# Patient Record
Sex: Male | Born: 1955 | Race: White | Hispanic: No | Marital: Married | Smoking: Never smoker
Health system: Southern US, Community
[De-identification: ages and names within clinical notes are randomized; demographics above are authoritative.]

## PROBLEM LIST (undated history)

## (undated) DIAGNOSIS — F419 Anxiety disorder, unspecified: Secondary | ICD-10-CM

## (undated) DIAGNOSIS — K602 Anal fissure, unspecified: Secondary | ICD-10-CM

## (undated) DIAGNOSIS — R7309 Other abnormal glucose: Secondary | ICD-10-CM

## (undated) DIAGNOSIS — K5190 Ulcerative colitis, unspecified, without complications: Secondary | ICD-10-CM

## (undated) DIAGNOSIS — I10 Essential (primary) hypertension: Secondary | ICD-10-CM

## (undated) DIAGNOSIS — K91850 Pouchitis: Secondary | ICD-10-CM

## (undated) DIAGNOSIS — T7840XA Allergy, unspecified, initial encounter: Secondary | ICD-10-CM

## (undated) DIAGNOSIS — H269 Unspecified cataract: Secondary | ICD-10-CM

## (undated) DIAGNOSIS — M1990 Unspecified osteoarthritis, unspecified site: Secondary | ICD-10-CM

## (undated) DIAGNOSIS — E785 Hyperlipidemia, unspecified: Secondary | ICD-10-CM

## (undated) DIAGNOSIS — F32A Depression, unspecified: Secondary | ICD-10-CM

## (undated) HISTORY — PX: COLON SURGERY: SHX602

## (undated) HISTORY — DX: Other abnormal glucose: R73.09

## (undated) HISTORY — DX: Unspecified cataract: H26.9

## (undated) HISTORY — DX: Allergy, unspecified, initial encounter: T78.40XA

## (undated) HISTORY — DX: Hyperlipidemia, unspecified: E78.5

## (undated) HISTORY — DX: Ulcerative colitis, unspecified, without complications: K51.90

## (undated) HISTORY — DX: Essential (primary) hypertension: I10

## (undated) HISTORY — DX: Unspecified osteoarthritis, unspecified site: M19.90

## (undated) HISTORY — PX: SMALL INTESTINE SURGERY: SHX150

## (undated) HISTORY — PX: POUCHOSCOPY: SHX2262

## (undated) HISTORY — DX: Depression, unspecified: F32.A

## (undated) HISTORY — DX: Anal fissure, unspecified: K60.2

## (undated) HISTORY — DX: Pouchitis: K91.850

---
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
## 1998-06-19 ENCOUNTER — Ambulatory Visit (HOSPITAL_COMMUNITY): Admission: RE | Admit: 1998-06-19 | Discharge: 1998-06-19 | Payer: Self-pay | Admitting: Gastroenterology

## 2011-05-25 ENCOUNTER — Other Ambulatory Visit (HOSPITAL_COMMUNITY): Payer: Self-pay | Admitting: Internal Medicine

## 2011-05-25 ENCOUNTER — Ambulatory Visit (HOSPITAL_COMMUNITY)
Admission: RE | Admit: 2011-05-25 | Discharge: 2011-05-25 | Disposition: A | Discharge status: Home / Self Care | Payer: BC Managed Care – PPO | Source: Ambulatory Visit | Attending: Internal Medicine | Admitting: Internal Medicine

## 2011-05-25 DIAGNOSIS — M79609 Pain in unspecified limb: Secondary | ICD-10-CM | POA: Insufficient documentation

## 2011-05-25 DIAGNOSIS — M545 Low back pain, unspecified: Secondary | ICD-10-CM | POA: Insufficient documentation

## 2012-02-02 HISTORY — PX: ELBOW SURGERY: SHX618

## 2012-03-13 ENCOUNTER — Other Ambulatory Visit: Payer: Self-pay | Admitting: Occupational Medicine

## 2012-03-13 ENCOUNTER — Ambulatory Visit: Payer: Self-pay

## 2012-03-13 DIAGNOSIS — M25521 Pain in right elbow: Secondary | ICD-10-CM

## 2012-08-01 HISTORY — PX: ELBOW LIGAMENT RECONSTRUCTION: SHX6359

## 2012-12-17 ENCOUNTER — Encounter: Payer: Self-pay | Admitting: Internal Medicine

## 2012-12-17 DIAGNOSIS — M1990 Unspecified osteoarthritis, unspecified site: Secondary | ICD-10-CM | POA: Insufficient documentation

## 2012-12-17 DIAGNOSIS — Z87898 Personal history of other specified conditions: Secondary | ICD-10-CM | POA: Insufficient documentation

## 2012-12-17 NOTE — Patient Instructions (Addendum)
Hypertension As your heart beats, it forces blood through your arteries. This force is your blood pressure. If the pressure is too high, it is called hypertension (HTN) or high blood pressure. HTN is dangerous because you may have it and not know it. High blood pressure may mean that your heart has to work harder to pump blood. Your arteries may be narrow or stiff. The extra work puts you at risk for heart disease, stroke, and other problems.  Blood pressure consists of two numbers, a higher number over a lower, 110/72, for example. It is stated as "110 over 72." The ideal is below 120 for the top number (systolic) and under 80 for the bottom (diastolic). Write down your blood pressure today. You should pay close attention to your blood pressure if you have certain conditions such as:  Heart failure.  Prior heart attack.  Diabetes  Chronic kidney disease.  Prior stroke.  Multiple risk factors for heart disease. To see if you have HTN, your blood pressure should be measured while you are seated with your arm held at the level of the heart. It should be measured at least twice. A one-time elevated blood pressure reading (especially in the Emergency Department) does not mean that you need treatment. There may be conditions in which the blood pressure is different between your right and left arms. It is important to see your caregiver soon for a recheck. Most people have essential hypertension which means that there is not a specific cause. This type of high blood pressure may be lowered by changing lifestyle factors such as:  Stress.  Smoking.  Lack of exercise.  Excessive weight.  Drug/tobacco/alcohol use.  Eating less salt. Most people do not have symptoms from high blood pressure until it has caused damage to the body. Effective treatment can often prevent, delay or reduce that damage. TREATMENT  When a cause has been identified, treatment for high blood pressure is directed at  the cause. There are a large number of medications to treat HTN. These fall into several categories, and your caregiver will help you select the medicines that are best for you. Medications may have side effects. You should review side effects with your caregiver. If your blood pressure stays high after you have made lifestyle changes or started on medicines,   Your medication(s) may need to be changed.  Other problems may need to be addressed.  Be certain you understand your prescriptions, and know how and when to take your medicine.  Be sure to follow up with your caregiver within the time frame advised (usually within two weeks) to have your blood pressure rechecked and to review your medications.  If you are taking more than one medicine to lower your blood pressure, make sure you know how and at what times they should be taken. Taking two medicines at the same time can result in blood pressure that is too low. SEEK IMMEDIATE MEDICAL CARE IF:  You develop a severe headache, blurred or changing vision, or confusion.  You have unusual weakness or numbness, or a faint feeling.  You have severe chest or abdominal pain, vomiting, or breathing problems. MAKE SURE YOU:   Understand these instructions.  Will watch your condition.  Will get help right away if you are not doing well or get worse. Document Released: 01/18/2005 Document Revised: 04/12/2011 Document Reviewed: 09/08/2007 [facility] Patient Information 2014 [place].   Cholesterol Cholesterol is a white, waxy, fat-like protein needed by your body  in small amounts. The liver makes all the cholesterol you need. It is carried from the liver by the blood through the blood vessels. Deposits (plaque) may build up on blood vessel walls. This makes the arteries narrower and stiffer. Plaque increases the risk for heart attack and stroke. You cannot feel your cholesterol level even if it is very high. The only way to know is by a  blood test to check your lipid (fats) levels. Once you know your cholesterol levels, you should keep a record of the test results. Work with your caregiver to to keep your levels in the desired range. WHAT THE RESULTS MEAN:  Total cholesterol is a rough measure of all the cholesterol in your blood.  LDL is the so-called bad cholesterol. This is the type that deposits cholesterol in the walls of the arteries. You want this level to be low.  HDL is the good cholesterol because it cleans the arteries and carries the LDL away. You want this level to be high.  Triglycerides are fat that the body can either burn for energy or store. High levels are closely linked to heart disease. DESIRED LEVELS:  Total cholesterol below 200.  LDL below 100 for people at risk, below 70 for very high risk.  HDL above 50 is good, above 60 is best.  Triglycerides below 150. HOW TO LOWER YOUR CHOLESTEROL:  Diet.  Choose fish or white meat chicken and [place], roasted or baked. Limit fatty cuts of red meat, fried foods, and processed meats, such as sausage and lunch meat.  Eat lots of fresh fruits and vegetables. Choose whole grains, beans, pasta, potatoes and cereals.  Use only small amounts of olive, corn or canola oils. Avoid butter, mayonnaise, shortening or palm kernel oils. Avoid foods with trans-fats.  Use skim/nonfat milk and low-fat/nonfat yogurt and cheeses. Avoid whole milk, cream, ice cream, egg yolks and cheeses. Healthy desserts include angel food cake, ginger snaps, animal crackers, hard candy, popsicles, and low-fat/nonfat frozen yogurt. Avoid pastries, cakes, pies and cookies.  Exercise.  A regular program helps decrease LDL and raises HDL.  Helps with weight control.  Do things that increase your activity level like gardening, walking, or taking the stairs.  Medication.  May be prescribed by your caregiver to help lowering cholesterol and the risk for heart disease.  You may need  medicine even if your levels are normal if you have several risk factors. HOME CARE INSTRUCTIONS   Follow your diet and exercise programs as suggested by your caregiver.  Take medications as directed.  Have blood work done when your caregiver feels it is necessary. MAKE SURE YOU:   Understand these instructions.  Will watch your condition.  Will get help right away if you are not doing well or get worse. Document Released: 10/13/2000 Document Revised: 04/12/2011 Document Reviewed: 04/05/2007 [facility] Patient Information 2014 [place], [place].   Diabetes and Exercise Exercising regularly is important. It is not just about losing weight. It has many health benefits, such as:  Improving your overall fitness, flexibility, and endurance.  Increasing your bone density.  Helping with weight control.  Decreasing your body fat.  Increasing your muscle strength.  Reducing stress and tension.  Improving your overall health. People with diabetes who exercise gain additional benefits because exercise:  Reduces appetite.  Improves the body's use of blood sugar (glucose).  Helps lower or control blood glucose.  Decreases blood pressure.  Helps control blood lipids (such as cholesterol and triglycerides).  Improves the  body's use of the hormone insulin by:  Increasing the body's insulin sensitivity.  Reducing the body's insulin needs.  Decreases the risk for heart disease because exercising:  Lowers cholesterol and triglycerides levels.  Increases the levels of good cholesterol (such as high-density lipoproteins [HDL]) in the body.  Lowers blood glucose levels. YOUR ACTIVITY PLAN  Choose an activity that you enjoy and set realistic goals. Your health care provider or diabetes educator can help you make an activity plan that works for you. You can break activities into 2 or 3 sessions throughout the day. Doing so is as good as one long session. Exercise ideas  include:  Taking the dog for a walk.  Taking the stairs instead of the elevator.  Dancing to your favorite song.  Doing your favorite exercise with a friend. RECOMMENDATIONS FOR EXERCISING WITH TYPE 1 OR TYPE 2 DIABETES   Check your blood glucose before exercising. If blood glucose levels are greater than 240 mg/dL, check for urine ketones. Do not exercise if ketones are present.  Avoid injecting insulin into areas of the body that are going to be exercised. For example, avoid injecting insulin into:  The arms when playing tennis.  The legs when jogging.  Keep a record of:  Food intake before and after you exercise.  Expected peak times of insulin action.  Blood glucose levels before and after you exercise.  The type and amount of exercise you have done.  Review your records with your health care provider. Your health care provider will help you to develop guidelines for adjusting food intake and insulin amounts before and after exercising.  If you take insulin or oral hypoglycemic agents, watch for signs and symptoms of hypoglycemia. They include:  Dizziness.  Shaking.  Sweating.  Chills.  Confusion.  Drink plenty of water while you exercise to prevent dehydration or heat stroke. Body water is lost during exercise and must be replaced.  Talk to your health care provider before starting an exercise program to make sure it is safe for you. Remember, almost any type of activity is better than none. Document Released: 04/10/2003 Document Revised: 09/20/2012 Document Reviewed: 06/27/2012 [facility] Patient Information 2014 [place].   Vitamin D Deficiency Vitamin D is an important vitamin that your body needs. Having too little of it in your body is called a deficiency. A very bad deficiency can make your bones soft and can cause a condition called rickets.  Vitamin D is important to your body for different reasons, such as:   It helps your body absorb 2  minerals called calcium and phosphorus.  It helps make your bones healthy.  It may prevent some diseases, such as diabetes and multiple sclerosis.  It helps your muscles and heart. You can get vitamin D in several ways. It is a natural part of some foods. The vitamin is also added to some dairy products and cereals. Some people take vitamin D supplements. Also, your body makes vitamin D when you are in the sun. It changes the sun's rays into a form of the vitamin that your body can use. CAUSES   Not eating enough foods that contain vitamin D.  Not getting enough sunlight.  Having certain digestive system diseases that make it hard to absorb vitamin D. These diseases include Crohn's disease, chronic pancreatitis, and cystic fibrosis.  Having a surgery in which part of the stomach or small intestine is removed.  Being obese. Fat cells pull vitamin D out of your blood.  That means that obese people may not have enough vitamin D left in their blood and in other body tissues.  Having chronic kidney or liver disease. RISK FACTORS Risk factors are things that make you more likely to develop a vitamin D deficiency. They include:  Being older.  Not being able to get outside very much.  Living in a nursing home.  Having had broken bones.  Having weak or thin bones (osteoporosis).  Having a disease or condition that changes how your body absorbs vitamin D.  Having dark skin.  Some medicines such as seizure medicines or steroids.  Being overweight or obese. SYMPTOMS Mild cases of vitamin D deficiency may not have any symptoms. If you have a very bad case, symptoms may include:  Bone pain.  Muscle pain.  Falling often.  Broken bones caused by a minor injury, due to osteoporosis. DIAGNOSIS A blood test is the best way to tell if you have a vitamin D deficiency. TREATMENT Vitamin D deficiency can be treated in different ways. Treatment for vitamin D deficiency depends on what is  causing it. Options include:  Taking vitamin D supplements.  Taking a calcium supplement. Your caregiver will suggest what dose is best for you. HOME CARE INSTRUCTIONS  Take any supplements that your caregiver prescribes. Follow the directions carefully. Take only the suggested amount.  Have your blood tested 2 months after you start taking supplements.  Eat foods that contain vitamin D. Healthy choices include:  Fortified dairy products, cereals, or juices. Fortified means vitamin D has been added to the food. Check the label on the package to be sure.  Fatty fish like salmon or trout.  Eggs.  Oysters.  Do not use a tanning bed.  Keep your weight at a healthy level. Lose weight if you need to.  Keep all follow-up appointments. Your caregiver will need to perform blood tests to make sure your vitamin D deficiency is going away. SEEK MEDICAL CARE IF:  You have any questions about your treatment.  You continue to have symptoms of vitamin D deficiency.  You have nausea or vomiting.  You are constipated.  You feel confused.  You have severe abdominal or back pain. MAKE SURE YOU:  Understand these instructions.  Will watch your condition.  Will get help right away if you are not doing well or get worse. Document Released: 04/12/2011 Document Revised: 05/15/2012 Document Reviewed: 04/12/2011 [facility] Patient Information 2014 [place].

## 2012-12-17 NOTE — Progress Notes (Signed)
Patient ID: Dillon [name], male   DOB: 12-17-1955, 57 y.o.   MRN: [id number]   This very nice 57 yo MWM presents for 6 week follow up with Hx/o elevated BP,  hyperlipidemia, pre-diabetes and vitamin D deficiency.    Today's BP is 116/82. Patient denies any cardiac type chest pain, palpitations, dyspnea, dizziness, claudication, or dependent edema.   Hyperlipidemia is controlled with diet. Last cholesterol 6 weeks ago was 240 and LDL 128. Patient has just returned from vacation in [place] and admits very poor diet compliance. He desires to defer any blood testing until he is able to achieve better diet.   Patient has chronic diarrhea post colectomy (for UC) and relates the citalopram he was put on for stress in dealing with his son seemed to aggravate his diarrhea and he therefore stopped it. Subsequently, we called in alprazolam 0.5 mg which he uses sparingly at 1/2 tab 1-2 x/day. Regarding the diarrhea, he has tried the prescribed questran which he has only taken sporadically.   Also, the patient has history of prediabetes with A1c of 5.7% in 2013 slightly improving to 5.6% at check-up 6 weeks ago. Patient denies any symptoms of reactive hypoglycemia, diabetic polys, paresthesias or visual blurring.   Further, Patient has history of vitamin D deficiency with last vitamin D of 50 six weeks ago and he relates he has not started  . Patient supplements vitamin without any suspected side-effects.  Current Outpatient Prescriptions on File Prior to Visit  Medication Sig Dispense Refill  . B Complex-C (B-COMPLEX WITH VITAMIN C) tablet Take 1 tablet by mouth daily.      . diphenoxylate-atropine (LOMOTIL) 2.5-0.025 MG per tablet Take by mouth as needed for diarrhea or loose stools.      . Ibuprofen (ADVIL) 200 MG CAPS Take by mouth as needed.      . Multiple Vitamins-Minerals (MULTIVITAMIN WITH MINERALS) tablet Take 1 tablet by mouth daily.          Allergies  Allergen Reactions  . Norco  [Hydrocodone-Acetaminophen]   . Penicillins     PMHx:   Past Medical History  Diagnosis Date  . Hypertension   . DJD (degenerative joint disease)   . Hyperlipidemia   . Elevated hemoglobin A1c     FHx:    Reviewed / unchanged  SHx:    Reviewed / unchanged  Systems Review: Neg to above   VS are WNL as recorded with nl. BP  On Exam: Appears well nourished - in no distress. Eyes: PERRLA, EOMs, conjunctiva no swelling or erythema. Sinuses: No frontal/maxillary tenderness ENT/Mouth: EAC's clear, TM's nl w/o erythema, bulging. Nares clear w/o erythema, swelling, exudates. Oropharynx clear without erythema or exudates. Oral hygiene is good. Tongue normal, non obstructing. Hearing intact.  Neck: Supple. Thyroid nl. Car 2+/2+ without bruits, nodes or JVD. Chest: Respirations nl with BS clear & equal w/o rales, rhonchi, wheezing or stridor.  Cor: Heart sounds normal w/ regular rate and rhythm without sig. murmurs, gallops, clicks, or rubs.  Abdomen: Soft & bowel sounds normal. Non-tender w/o guarding, rebound, hernias, masses, or organomegaly.  Musculoskeletal: Full ROM all peripheral extremities, joint stability, 5/5 strength, and normal gait.  Skin: Warm, dry without exposed rashes, lesions, ecchymosis apparent.  Neuro: Cranial nerves intact, reflexes equal bilaterally. Sensory-motor testing grossly intact.  Pysch: Alert & oriented x 3. Insight and judgement nl & appropriate. No ideations.  Assessment and Plan:  1. Elevated BP - monitoring expectantly  2. Hyperlipidemia - Continue diet, exercise,&  lifestyle modifications.  3. Pre-diabetes - Continue diet, exercise, lifestyle modifications. Monitor appropriate labs.  4. Vitamin D Deficiency - Continue supplementation.  Patient defers lab testing today and is advised to return in 3 months.

## 2012-12-18 ENCOUNTER — Encounter: Payer: Self-pay | Admitting: Internal Medicine

## 2012-12-18 ENCOUNTER — Ambulatory Visit: Payer: BC Managed Care – PPO | Admitting: Internal Medicine

## 2012-12-18 VITALS — BP 116/82 | HR 76 | Temp 97.9°F | Resp 18 | Ht 67.5 in | Wt 172.4 lb

## 2012-12-18 DIAGNOSIS — E559 Vitamin D deficiency, unspecified: Secondary | ICD-10-CM

## 2012-12-18 DIAGNOSIS — F411 Generalized anxiety disorder: Secondary | ICD-10-CM

## 2012-12-18 DIAGNOSIS — K5190 Ulcerative colitis, unspecified, without complications: Secondary | ICD-10-CM

## 2012-12-18 DIAGNOSIS — R7309 Other abnormal glucose: Secondary | ICD-10-CM

## 2012-12-18 DIAGNOSIS — E782 Mixed hyperlipidemia: Secondary | ICD-10-CM

## 2012-12-18 DIAGNOSIS — Z79899 Other long term (current) drug therapy: Secondary | ICD-10-CM

## 2012-12-18 DIAGNOSIS — R030 Elevated blood-pressure reading, without diagnosis of hypertension: Secondary | ICD-10-CM

## 2012-12-18 MED ORDER — CHOLESTYRAMINE 4 G PO PACK: 1.0000 | PACK | Freq: Two times a day (BID) | ORAL | Status: DC

## 2012-12-18 MED ORDER — ALPRAZOLAM 0.5 MG PO TABS: ORAL_TABLET | ORAL | Status: DC

## 2013-02-06 ENCOUNTER — Encounter: Payer: Self-pay | Admitting: Physician Assistant

## 2013-02-06 ENCOUNTER — Ambulatory Visit (INDEPENDENT_AMBULATORY_CARE_PROVIDER_SITE_OTHER): Payer: BC Managed Care – PPO | Admitting: Physician Assistant

## 2013-02-06 VITALS — BP 122/78 | HR 80 | Temp 98.1°F | Resp 16 | Ht 67.5 in | Wt 173.0 lb

## 2013-02-06 DIAGNOSIS — L03019 Cellulitis of unspecified finger: Secondary | ICD-10-CM

## 2013-02-06 DIAGNOSIS — Z23 Encounter for immunization: Secondary | ICD-10-CM

## 2013-02-06 DIAGNOSIS — L03011 Cellulitis of right finger: Secondary | ICD-10-CM

## 2013-02-06 MED ORDER — DOXYCYCLINE HYCLATE 100 MG PO TABS
100.0000 mg | ORAL_TABLET | Freq: Two times a day (BID) | ORAL | Status: DC
Start: 2013-02-06 — End: 2013-03-22

## 2013-02-06 MED ORDER — HYDROCODONE-ACETAMINOPHEN 5-325 MG PO TABS: 1.0000 | ORAL_TABLET | Freq: Four times a day (QID) | ORAL | Status: DC | PRN

## 2013-02-06 NOTE — Progress Notes (Signed)
   Subjective:    Patient ID: Dillon [name], male    DOB: [birth year], 58 y.o.   MRN: [id number]  Animal Bite  Episode onset: 01/24/2013. The incident occurred at home (A dog known to the family bit him). There is an injury to the right ring finger. It is unlikely that a foreign body is present. Pertinent negatives include no chest pain, no visual disturbance, no abdominal pain, no headaches, no cough and no difficulty breathing. There have been no prior injuries to these areas. He is right-handed. His tetanus status is out of date. He has received no recent medical care.    Review of Systems  Constitutional: Negative.   HENT: Negative.   Eyes: Negative.  Negative for visual disturbance.  Respiratory: Negative.  Negative for cough.   Cardiovascular: Negative.  Negative for chest pain.  Gastrointestinal: Negative.  Negative for abdominal pain.  Musculoskeletal: Negative.   Skin: Positive for wound (right ring finger. ).  Neurological: Negative for headaches.      Objective:   Physical Exam  Constitutional: He is oriented to person, place, and time. He appears well-developed and well-nourished.  Cardiovascular: Intact distal pulses and normal pulses.   Pulmonary/Chest: Effort normal and breath sounds normal.  Abdominal: Soft. Bowel sounds are normal.  Musculoskeletal: Normal range of motion.  Neurological: He is alert and oriented to person, place, and time. He has normal strength. No sensory deficit.  Skin: Skin is warm and dry.  Right medial ring finger with swelling, fluctuance, erythema, tenderness on nail cuticle.    Procedure: Area was cleaned. Lidocaine no epi 3 cc was used to do a digital block on right index finger. Then an 11 blade was used to make an incsicion in the cuticle edge. Serosanguinous fluid was expressed and the patient had immediate pain relief. The area was cleaned and wrapped.     Assessment & Plan:  Infection of nail bed of finger, right - Plan: Tdap  vaccine greater than or equal to 58yo IM, HYDROcodone-acetaminophen (NORCO) 5-325 MG per tablet Doxycycline 100 # 20 since he is allergic to PCN Instructions given for cleaning the area.  If it is not better he will follow up in office

## 2013-02-06 NOTE — Patient Instructions (Signed)
Paronychia Paronychia is an inflammatory reaction involving the folds of the skin surrounding the fingernail. This is commonly caused by an infection in the skin around a nail. The most common cause of paronychia is frequent wetting of the hands (as seen with bartenders, food servers, nurses or others who wet their hands). This makes the skin around the fingernail susceptible to infection by bacteria (germs) or fungus. Other predisposing factors are:  Aggressive manicuring.  Nail biting.  Thumb sucking. The most common cause is a staphylococcal (a type of germ) infection, or a fungal (Candida) infection. When caused by a germ, it usually comes on suddenly with redness, swelling, pus and is often painful. It may get under the nail and form an abscess (collection of pus), or form an abscess around the nail. If the nail itself is infected with a fungus, the treatment is usually prolonged and may require oral medicine for up to one year. Your caregiver will determine the length of time treatment is required. The paronychia caused by bacteria (germs) may largely be avoided by not pulling on hangnails or picking at cuticles. When the infection occurs at the tips of the finger it is called felon. When the cause of paronychia is from the herpes simplex virus (HSV) it is called herpetic whitlow. TREATMENT  When an abscess is present treatment is often incision and drainage. This means that the abscess must be cut open so the pus can get out. When this is done, the following home care instructions should be followed. HOME CARE INSTRUCTIONS   It is important to keep the affected fingers very dry. Rubber or plastic gloves over cotton gloves should be used whenever the hand must be placed in water.  Keep wound clean, dry and dressed as suggested by your caregiver between warm soaks or warm compresses.  Soak in warm water for fifteen to twenty minutes three to four times per day for bacterial infections. Fungal  infections are very difficult to treat, so often require treatment for long periods of time.  For bacterial (germ) infections take antibiotics (medicine which kill germs) as directed and finish the prescription, even if the problem appears to be solved before the medicine is gone.  Only take over-the-counter or prescription medicines for pain, discomfort, or fever as directed by your caregiver. SEEK IMMEDIATE MEDICAL CARE IF:  You have redness, swelling, or increasing pain in the wound.  You notice pus coming from the wound.  You have a fever.  You notice a bad smell coming from the wound or dressing. Document Released: 07/14/2000 Document Revised: 04/12/2011 Document Reviewed: 03/15/2008 [employer] Patient Information 2014 [employer].  

## 2013-03-22 ENCOUNTER — Encounter: Payer: Self-pay | Admitting: Physician Assistant

## 2013-03-22 ENCOUNTER — Ambulatory Visit: Payer: Self-pay | Admitting: Emergency Medicine

## 2013-03-22 ENCOUNTER — Ambulatory Visit (INDEPENDENT_AMBULATORY_CARE_PROVIDER_SITE_OTHER): Payer: BC Managed Care – PPO | Admitting: Physician Assistant

## 2013-03-22 VITALS — BP 110/68 | HR 80 | Temp 97.7°F | Resp 16 | Ht 67.5 in | Wt 171.0 lb

## 2013-03-22 DIAGNOSIS — R7309 Other abnormal glucose: Secondary | ICD-10-CM

## 2013-03-22 DIAGNOSIS — E559 Vitamin D deficiency, unspecified: Secondary | ICD-10-CM | POA: Insufficient documentation

## 2013-03-22 DIAGNOSIS — R030 Elevated blood-pressure reading, without diagnosis of hypertension: Secondary | ICD-10-CM

## 2013-03-22 DIAGNOSIS — E782 Mixed hyperlipidemia: Secondary | ICD-10-CM

## 2013-03-22 DIAGNOSIS — Z79899 Other long term (current) drug therapy: Secondary | ICD-10-CM

## 2013-03-22 LAB — MAGNESIUM: Magnesium: 1.8 mg/dL (ref 1.5–2.5)

## 2013-03-22 LAB — HEPATIC FUNCTION PANEL
ALT: 26 U/L (ref 0–53)
AST: 20 U/L (ref 0–37)
Albumin: 4.7 g/dL (ref 3.5–5.2)
Alkaline Phosphatase: 65 U/L (ref 39–117)
BILIRUBIN DIRECT: 0.1 mg/dL (ref 0.0–0.3)
BILIRUBIN INDIRECT: 0.5 mg/dL (ref 0.2–1.2)
BILIRUBIN TOTAL: 0.6 mg/dL (ref 0.2–1.2)
Total Protein: 7.5 g/dL (ref 6.0–8.3)

## 2013-03-22 LAB — CBC WITH DIFFERENTIAL/PLATELET
BASOS ABS: 0 10*3/uL (ref 0.0–0.1)
Basophils Relative: 0 % (ref 0–1)
Eosinophils Absolute: 0.2 10*3/uL (ref 0.0–0.7)
Eosinophils Relative: 2 % (ref 0–5)
HEMATOCRIT: 43.8 % (ref 39.0–52.0)
Hemoglobin: 15.3 g/dL (ref 13.0–17.0)
LYMPHS PCT: 29 % (ref 12–46)
Lymphs Abs: 2.2 10*3/uL (ref 0.7–4.0)
MCH: 31.3 pg (ref 26.0–34.0)
MCHC: 34.9 g/dL (ref 30.0–36.0)
MCV: 89.6 fL (ref 78.0–100.0)
MONO ABS: 0.5 10*3/uL (ref 0.1–1.0)
Monocytes Relative: 7 % (ref 3–12)
NEUTROS ABS: 4.8 10*3/uL (ref 1.7–7.7)
Neutrophils Relative %: 62 % (ref 43–77)
PLATELETS: 344 10*3/uL (ref 150–400)
RBC: 4.89 MIL/uL (ref 4.22–5.81)
RDW: 13.4 % (ref 11.5–15.5)
WBC: 7.7 10*3/uL (ref 4.0–10.5)

## 2013-03-22 LAB — BASIC METABOLIC PANEL WITH GFR
BUN: 16 mg/dL (ref 6–23)
CHLORIDE: 103 meq/L (ref 96–112)
CO2: 28 mEq/L (ref 19–32)
Calcium: 9.6 mg/dL (ref 8.4–10.5)
Creat: 1.14 mg/dL (ref 0.50–1.35)
GFR, Est African American: 82 mL/min
GFR, Est Non African American: 71 mL/min
Glucose, Bld: 99 mg/dL (ref 70–99)
POTASSIUM: 4.1 meq/L (ref 3.5–5.3)
Sodium: 141 mEq/L (ref 135–145)

## 2013-03-22 LAB — LIPID PANEL
CHOL/HDL RATIO: 3.4 ratio
Cholesterol: 226 mg/dL — ABNORMAL HIGH (ref 0–200)
HDL: 66 mg/dL (ref >39)
LDL CALC: 109 mg/dL — AB (ref 0–99)
Triglycerides: 255 mg/dL — ABNORMAL HIGH (ref <150)
VLDL: 51 mg/dL — ABNORMAL HIGH (ref 0–40)

## 2013-03-22 LAB — HEMOGLOBIN A1C
Hgb A1c MFr Bld: 5.6 % (ref <5.7)
Mean Plasma Glucose: 114 mg/dL (ref <117)

## 2013-03-22 LAB — TSH: TSH: 1.593 u[IU]/mL (ref 0.350–4.500)

## 2013-03-22 NOTE — Patient Instructions (Signed)
Bad carbs also include fruit juice, alcohol, and sweet tea. These are empty calories that do not signal to your brain that you are full.   Please remember the good carbs are still carbs which convert into sugar. So please measure them out no more than 1/2-1 cup of rice, oatmeal, pasta, and beans.  Veggies are however free foods! Pile them on.   I like lean protein at every meal such as chicken, [place], pork chops, cottage cheese, etc. Just do not fry these meats and please center your meal around vegetable, the meats should be a side dish.   No all fruit is created equal. Please see the list below, the fruit at the bottom is higher in sugars than the fruit at the top   Fat and Cholesterol Control Diet Fat and cholesterol levels in your blood and organs are influenced by your diet. High levels of fat and cholesterol may lead to diseases of the heart, small and large blood vessels, gallbladder, liver, and pancreas. CONTROLLING FAT AND CHOLESTEROL WITH DIET Although exercise and lifestyle factors are important, your diet is key. That is because certain foods are known to raise cholesterol and others to lower it. The goal is to balance foods for their effect on cholesterol and more importantly, to replace saturated and trans fat with other types of fat, such as monounsaturated fat, polyunsaturated fat, and omega-3 fatty acids. On average, a person should consume no more than 15 to 17 g of saturated fat daily. Saturated and trans fats are considered "bad" fats, and they will raise LDL cholesterol. Saturated fats are primarily found in animal products such as meats, butter, and cream. However, that does not mean you need to give up all your favorite foods. Today, there are good tasting, low-fat, low-cholesterol substitutes for most of the things you like to eat. Choose low-fat or nonfat alternatives. Choose round or loin cuts of red meat. These types of cuts are lowest in fat and cholesterol. Chicken  (without the skin), fish, veal, and ground [place] breast are great choices. Eliminate fatty meats, such as hot dogs and salami. Even shellfish have little or no saturated fat. Have a 3 oz (85 g) portion when you eat lean meat, poultry, or fish. Trans fats are also called "partially hydrogenated oils." They are oils that have been scientifically manipulated so that they are solid at room temperature resulting in a longer shelf life and improved taste and texture of foods in which they are added. Trans fats are found in stick margarine, some tub margarines, cookies, crackers, and baked goods.  When baking and cooking, oils are a great substitute for butter. The monounsaturated oils are especially beneficial since it is believed they lower LDL and raise HDL. The oils you should avoid entirely are saturated tropical oils, such as coconut and palm.  Remember to eat a lot from food groups that are naturally free of saturated and trans fat, including fish, fruit, vegetables, beans, grains (barley, rice, couscous, bulgur wheat), and pasta (without cream sauces).  IDENTIFYING FOODS THAT LOWER FAT AND CHOLESTEROL  Soluble fiber may lower your cholesterol. This type of fiber is found in fruits such as apples, vegetables such as broccoli, potatoes, and carrots, legumes such as beans, peas, and lentils, and grains such as barley. Foods fortified with plant sterols (phytosterol) may also lower cholesterol. You should eat at least 2 g per day of these foods for a cholesterol lowering effect.  Read package labels to identify low-saturated fats,  trans fat free, and low-fat foods at the supermarket. Select cheeses that have only 2 to 3 g saturated fat per ounce. Use a heart-healthy tub margarine that is free of trans fats or partially hydrogenated oil. When buying baked goods (cookies, crackers), avoid partially hydrogenated oils. Breads and muffins should be made from whole grains (whole-wheat or whole oat flour, instead of  "flour" or "enriched flour"). Buy non-creamy canned soups with reduced salt and no added fats.  FOOD PREPARATION TECHNIQUES  Never deep-fry. If you must fry, either stir-fry, which uses very little fat, or use non-stick cooking sprays. When possible, broil, bake, or roast meats, and steam vegetables. Instead of putting butter or margarine on vegetables, use lemon and herbs, applesauce, and cinnamon (for squash and sweet potatoes). Use nonfat yogurt, salsa, and low-fat dressings for salads.  LOW-SATURATED FAT / LOW-FAT FOOD SUBSTITUTES Meats / Saturated Fat (g)  Avoid: Steak, marbled (3 oz/85 g) / 11 g  Choose: Steak, lean (3 oz/85 g) / 4 g  Avoid: Hamburger (3 oz/85 g) / 7 g  Choose: Hamburger, lean (3 oz/85 g) / 5 g  Avoid: Ham (3 oz/85 g) / 6 g  Choose: Ham, lean cut (3 oz/85 g) / 2.4 g  Avoid: Chicken, with skin, dark meat (3 oz/85 g) / 4 g  Choose: Chicken, skin removed, dark meat (3 oz/85 g) / 2 g  Avoid: Chicken, with skin, light meat (3 oz/85 g) / 2.5 g  Choose: Chicken, skin removed, light meat (3 oz/85 g) / 1 g Dairy / Saturated Fat (g)  Avoid: Whole milk (1 cup) / 5 g  Choose: Low-fat milk, 2% (1 cup) / 3 g  Choose: Low-fat milk, 1% (1 cup) / 1.5 g  Choose: Skim milk (1 cup) / 0.3 g  Avoid: Hard cheese (1 oz/28 g) / 6 g  Choose: Skim milk cheese (1 oz/28 g) / 2 to 3 g  Avoid: Cottage cheese, 4% fat (1 cup) / 6.5 g  Choose: Low-fat cottage cheese, 1% fat (1 cup) / 1.5 g  Avoid: Ice cream (1 cup) / 9 g  Choose: Sherbet (1 cup) / 2.5 g  Choose: Nonfat frozen yogurt (1 cup) / 0.3 g  Choose: Frozen fruit bar / trace  Avoid: Whipped cream (1 tbs) / 3.5 g  Choose: Nondairy whipped topping (1 tbs) / 1 g Condiments / Saturated Fat (g)  Avoid: Mayonnaise (1 tbs) / 2 g  Choose: Low-fat mayonnaise (1 tbs) / 1 g  Avoid: Butter (1 tbs) / 7 g  Choose: Extra light margarine (1 tbs) / 1 g  Avoid: Coconut oil (1 tbs) / 11.8 g  Choose: Olive oil (1 tbs) / 1.8  g  Choose: Corn oil (1 tbs) / 1.7 g  Choose: Safflower oil (1 tbs) / 1.2 g  Choose: Sunflower oil (1 tbs) / 1.4 g  Choose: Soybean oil (1 tbs) / 2.4 g  Choose: Canola oil (1 tbs) / 1 g Document Released: 01/18/2005 Document Revised: 05/15/2012 Document Reviewed: 07/09/2010 [employer] Patient Information 2014 [place], [place].

## 2013-03-22 NOTE — Progress Notes (Signed)
HPI 58 y.o. male  presents for 3 month follow up with hypertension, hyperlipidemia, prediabetes and vitamin D. His blood pressure has been controlled at home, today their BP is BP: 110/68 mmHg He denies chest pain, shortness of breath, dizziness.  His cholesterol is diet controlled. His cholesterol is controlled. The cholesterol last visit was:  LDL 128, trigs 197 He has been working on diet and exercise for prediabetes, and denies blurry vision, polydipsia, polyphagia and polyuria. Last A1C in the office was: A1C 5.6 Patient is on Vitamin D supplement.    Current Medications:  Current Outpatient Prescriptions on File Prior to Visit  Medication Sig Dispense Refill  . ALPRAZolam (XANAX) 0.5 MG tablet 1/2 to 1 tab TID prn anxiety  1 tablet  0  . B Complex-C (B-COMPLEX WITH VITAMIN C) tablet Take 1 tablet by mouth daily.      . diphenoxylate-atropine (LOMOTIL) 2.5-0.025 MG per tablet Take by mouth as needed for diarrhea or loose stools.      Marland Kitchen HYDROcodone-acetaminophen (NORCO) 5-325 MG per tablet Take 1 tablet by mouth every 6 (six) hours as needed for moderate pain.  60 tablet  0  . Ibuprofen (ADVIL) 200 MG CAPS Take by mouth as needed.      . Multiple Vitamins-Minerals (MULTIVITAMIN WITH MINERALS) tablet Take 1 tablet by mouth daily.       No current facility-administered medications on file prior to visit.   Medical History:  Past Medical History  Diagnosis Date  . Hypertension   . DJD (degenerative joint disease)   . Hyperlipidemia   . Elevated hemoglobin A1c    Allergies:  Allergies  Allergen Reactions  . Norco [Hydrocodone-Acetaminophen]   . Penicillins     ROS Constitutional: Denies fever, chills, headaches, insomnia, fatigue, night sweats Eyes: Denies redness, blurred vision, diplopia, discharge, itchy, watery eyes.  ENT: Denies congestion, post nasal drip, sore throat, earache, dental pain, Tinnitus, Vertigo, Sinus pain, snoring.  Cardio: Denies chest pain, palpitations,  irregular heartbeat, dyspnea, diaphoresis, orthopnea, PND, claudication, edema Respiratory: denies cough, shortness of breath, wheezing.  Gastrointestinal: + diarrhea Denies dysphagia, heartburn, AB pain/ cramps, N/V, constipation, hematemesis, melena, hematochezia,  hemorrhoids Genitourinary: Denies dysuria, frequency, urgency, nocturia, hesitancy, discharge, hematuria, flank pain Musculoskeletal: Denies myalgia, stiffness, pain, swelling and strain/sprain. Skin: Denies pruritis, rash, changing in skin lesion Neuro: Denies Weakness, tremor, incoordination, spasms, pain Psychiatric: Denies confusion, memory loss, sensory loss Endocrine: Denies change in weight, skin, hair change, nocturia Diabetic Polys, Denies visual blurring, hyper /hypo glycemic episodes, and paresthesia, Heme/Lymph: Denies Excessive bleeding, bruising, enlarged lymph nodes  Family history- Review and unchanged Social history- Review and unchanged Physical Exam: Filed Vitals:   03/22/13 0934  BP: 110/68  Pulse: 80  Temp: 97.7 F (36.5 C)  Resp: 16   Filed Weights   03/22/13 0934  Weight: 171 lb (77.565 kg)   General Appearance: Well nourished, in no apparent distress. Eyes: PERRLA, EOMs, conjunctiva no swelling or erythema Sinuses: No Frontal/maxillary tenderness ENT/Mouth: Ext aud canals clear, TMs without erythema, bulging. No erythema, swelling, or exudate on post pharynx.  Tonsils not swollen or erythematous. Hearing normal.  Neck: Supple, thyroid normal.  Respiratory: Respiratory effort normal, BS equal bilaterally without rales, rhonchi, wheezing or stridor.  Cardio: RRR with no MRGs. Brisk peripheral pulses without edema.  Abdomen: Soft, + BS.  Non tender, no guarding, rebound, hernias, masses. Lymphatics: Non tender without lymphadenopathy.  Musculoskeletal: Full ROM, 5/5 strength, normal gait.  Skin: Warm, dry without rashes, lesions, ecchymosis.  Neuro: Cranial nerves intact. Normal muscle tone, no  cerebellar symptoms. Sensation intact.  Psych: Awake and oriented X 3, normal affect, Insight and Judgment appropriate.   Assessment and Plan:  Hypertension: Continue medication, monitor blood pressure at home.  Continue DASH diet. Cholesterol: Continue diet and exercise. Check cholesterol.  Pre-diabetes-Continue diet and exercise. Check A1C Vitamin D Def- check level and continue medications.   Continue diet and meds as discussed. Further disposition pending results of labs.  Vicie Mutters 9:48 AM

## 2013-03-23 LAB — INSULIN, FASTING: Insulin fasting, serum: 6 u[IU]/mL (ref 3–28)

## 2013-03-23 LAB — VITAMIN D 25 HYDROXY (VIT D DEFICIENCY, FRACTURES): VIT D 25 HYDROXY: 53 ng/mL (ref 30–89)

## 2013-04-27 ENCOUNTER — Ambulatory Visit (INDEPENDENT_AMBULATORY_CARE_PROVIDER_SITE_OTHER): Payer: BC Managed Care – PPO | Admitting: Physician Assistant

## 2013-04-27 ENCOUNTER — Encounter: Payer: Self-pay | Admitting: Physician Assistant

## 2013-04-27 VITALS — BP 122/70 | HR 88 | Temp 97.7°F | Resp 16 | Wt 169.0 lb

## 2013-04-27 DIAGNOSIS — R509 Fever, unspecified: Secondary | ICD-10-CM

## 2013-04-27 DIAGNOSIS — N390 Urinary tract infection, site not specified: Secondary | ICD-10-CM

## 2013-04-27 DIAGNOSIS — K5190 Ulcerative colitis, unspecified, without complications: Secondary | ICD-10-CM

## 2013-04-27 LAB — CBC WITH DIFFERENTIAL/PLATELET
BASOS PCT: 0 % (ref 0–1)
Basophils Absolute: 0 10*3/uL (ref 0.0–0.1)
Eosinophils Absolute: 0.1 10*3/uL (ref 0.0–0.7)
Eosinophils Relative: 2 % (ref 0–5)
HCT: 39.1 % (ref 39.0–52.0)
HEMOGLOBIN: 13.8 g/dL (ref 13.0–17.0)
LYMPHS PCT: 29 % (ref 12–46)
Lymphs Abs: 2 10*3/uL (ref 0.7–4.0)
MCH: 30.5 pg (ref 26.0–34.0)
MCHC: 35.3 g/dL (ref 30.0–36.0)
MCV: 86.3 fL (ref 78.0–100.0)
MONOS PCT: 13 % — AB (ref 3–12)
Monocytes Absolute: 0.9 10*3/uL (ref 0.1–1.0)
NEUTROS PCT: 56 % (ref 43–77)
Neutro Abs: 3.9 10*3/uL (ref 1.7–7.7)
Platelets: 284 10*3/uL (ref 150–400)
RBC: 4.53 MIL/uL (ref 4.22–5.81)
RDW: 13.5 % (ref 11.5–15.5)
WBC: 6.9 10*3/uL (ref 4.0–10.5)

## 2013-04-27 MED ORDER — CIPROFLOXACIN HCL 500 MG PO TABS: 500.0000 mg | ORAL_TABLET | Freq: Two times a day (BID) | ORAL | Status: AC

## 2013-04-27 NOTE — Progress Notes (Signed)
   Subjective:    Patient ID: Dillon [name], male    DOB: Nov 04, 1955, 58 y.o.   MRN: [id number]  Fever  This is a new problem. Episode onset: Saturday- started with chills and a fever. The maximum temperature noted was 103 to 103.9 F (sweats, 2-3 days, intermittent). Associated symptoms include headaches, muscle aches and nausea. Pertinent negatives include no abdominal pain, chest pain, congestion, coughing, diarrhea, ear pain, sleepiness, sore throat, urinary pain, vomiting or wheezing. He has tried NSAIDs for the symptoms.    Review of Systems  Constitutional: Positive for fever, chills and fatigue.  HENT: Negative for congestion, ear pain, postnasal drip, rhinorrhea, sinus pressure and sore throat.   Respiratory: Negative for cough, chest tightness, shortness of breath and wheezing.   Cardiovascular: Negative for chest pain, palpitations and leg swelling.  Gastrointestinal: Positive for nausea and abdominal distention (increased bowel sounds). Negative for vomiting, abdominal pain, diarrhea, constipation, blood in stool, anal bleeding and rectal pain.  Genitourinary: Negative for dysuria, frequency, hematuria (Dark urine), flank pain, enuresis and difficulty urinating.  Musculoskeletal: Positive for myalgias. Negative for arthralgias, back pain, gait problem, joint swelling, neck pain and neck stiffness.  Neurological: Positive for headaches.      Objective:   Physical Exam  Constitutional: He appears well-developed and well-nourished. No distress.  HENT:  Head: Normocephalic and atraumatic.  Eyes: Conjunctivae are normal. Pupils are equal, round, and reactive to light.  Neck: Normal range of motion. Neck supple.  Cardiovascular: Normal rate and regular rhythm.   Pulmonary/Chest: Effort normal and breath sounds normal.  Abdominal: Soft. He exhibits distension. He exhibits no mass. Bowel sounds are increased. There is no tenderness. There is no rigidity, no rebound and no guarding.   Large vertical abdominal scar  Lymphadenopathy:    He has no cervical adenopathy.  Skin: He is not diaphoretic.      Assessment & Plan:  Fever, unspecified - ? Prostatis, pouchitis, viral infection- Plan: CBC with Differential, BASIC METABOLIC PANEL WITH GFR, Hepatic function panel- Will cover with broad spectrum Cipro 500 BID #28  UTI (urinary tract infection) - Plan: Urinalysis, Routine w reflex microscopic, Urine culture  Go to the ER if any worsening fever, AB pain, CP, SOB.

## 2013-04-27 NOTE — Patient Instructions (Signed)
Fever, Adult A fever is a higher than normal body temperature. In an adult, an oral temperature around 98.6 F (37 C) is considered normal. A temperature of 100.4 F (38 C) or higher is generally considered a fever. Mild or moderate fevers generally have no long-term effects and often do not require treatment. Extreme fever (greater than or equal to 106 F or 41.1 C) can cause seizures. The sweating that may occur with repeated or prolonged fever may cause dehydration. Elderly people can develop confusion during a fever. A measured temperature can vary with:  Age.  Time of day.  Method of measurement (mouth, underarm, rectal, or ear). The fever is confirmed by taking a temperature with a thermometer. Temperatures can be taken different ways. Some methods are accurate and some are not.  An oral temperature is used most commonly. Electronic thermometers are fast and accurate.  An ear temperature will only be accurate if the thermometer is positioned as recommended by the manufacturer.  A rectal temperature is accurate and done for those adults who have a condition where an oral temperature cannot be taken.  An underarm (axillary) temperature is not accurate and not recommended. Fever is a symptom, not a disease.  CAUSES   Infections commonly cause fever.  Some noninfectious causes for fever include:  Some arthritis conditions.  Some thyroid or adrenal gland conditions.  Some immune system conditions.  Some types of cancer.  A medicine reaction.  High doses of certain street drugs such as methamphetamine.  Dehydration.  Exposure to high outside or room temperatures.  Occasionally, the source of a fever cannot be determined. This is sometimes called a "fever of unknown origin" (FUO).  Some situations may lead to a temporary rise in body temperature that may go away on its own. Examples are:  Childbirth.  Surgery.  Intense exercise. HOME CARE INSTRUCTIONS   Take  appropriate medicines for fever. Follow dosing instructions carefully. If you use acetaminophen to reduce the fever, be careful to avoid taking other medicines that also contain acetaminophen. Do not take aspirin for a fever if you are younger than age 61. There is an association with Reye's syndrome. Reye's syndrome is a rare but potentially deadly disease.  If an infection is present and antibiotics have been prescribed, take them as directed. Finish them even if you start to feel better.  Rest as needed.  Maintain an adequate fluid intake. To prevent dehydration during an illness with prolonged or recurrent fever, you may need to drink extra fluid.Drink enough fluids to keep your urine clear or pale yellow.  Sponging or bathing with room temperature water may help reduce body temperature. Do not use ice water or alcohol sponge baths.  Dress comfortably, but do not over-bundle. SEEK MEDICAL CARE IF:   You are unable to keep fluids down.  You develop vomiting or diarrhea.  You are not feeling at least partly better after 3 days.  You develop new symptoms or problems. SEEK IMMEDIATE MEDICAL CARE IF:   You have shortness of breath or trouble breathing.  You develop excessive weakness.  You are dizzy or you faint.  You are extremely thirsty or you are making little or no urine.  You develop new pain that was not there before (such as in the head, neck, chest, back, or abdomen).  You have persistant vomiting and diarrhea for more than 1 to 2 days.  You develop a stiff neck or your eyes become sensitive to light.  You develop a  skin rash.  You have a fever or persistent symptoms for more than 2 to 3 days.  You have a fever and your symptoms suddenly get worse. MAKE SURE YOU:   Understand these instructions.  Will watch your condition.  Will get help right away if you are not doing well or get worse. Document Released: 07/14/2000 Document Revised: 04/12/2011 Document  Reviewed: 11/19/2010 [facility] Patient Information 2014 [place], [place].

## 2013-04-28 LAB — URINALYSIS, ROUTINE W REFLEX MICROSCOPIC
BILIRUBIN URINE: NEGATIVE
GLUCOSE, UA: NEGATIVE mg/dL
Hgb urine dipstick: NEGATIVE
Ketones, ur: NEGATIVE mg/dL
Leukocytes, UA: NEGATIVE
Nitrite: NEGATIVE
Protein, ur: NEGATIVE mg/dL
SPECIFIC GRAVITY, URINE: 1.015 (ref 1.005–1.030)
Urobilinogen, UA: 0.2 mg/dL (ref 0.0–1.0)
pH: 6 (ref 5.0–8.0)

## 2013-04-28 LAB — HEPATIC FUNCTION PANEL
ALT: 37 U/L (ref 0–53)
AST: 31 U/L (ref 0–37)
Albumin: 3.7 g/dL (ref 3.5–5.2)
Alkaline Phosphatase: 93 U/L (ref 39–117)
BILIRUBIN INDIRECT: 0.4 mg/dL (ref 0.2–1.2)
Bilirubin, Direct: 0.2 mg/dL (ref 0.0–0.3)
Total Bilirubin: 0.6 mg/dL (ref 0.2–1.2)
Total Protein: 6.6 g/dL (ref 6.0–8.3)

## 2013-04-28 LAB — BASIC METABOLIC PANEL WITH GFR
BUN: 7 mg/dL (ref 6–23)
CO2: 29 mEq/L (ref 19–32)
Calcium: 9 mg/dL (ref 8.4–10.5)
Chloride: 97 mEq/L (ref 96–112)
Creat: 0.73 mg/dL (ref 0.50–1.35)
Glucose, Bld: 89 mg/dL (ref 70–99)
POTASSIUM: 3.7 meq/L (ref 3.5–5.3)
Sodium: 137 mEq/L (ref 135–145)

## 2013-04-28 LAB — URINE CULTURE
COLONY COUNT: NO GROWTH
ORGANISM ID, BACTERIA: NO GROWTH

## 2013-08-07 ENCOUNTER — Ambulatory Visit: Payer: Self-pay

## 2013-08-07 ENCOUNTER — Other Ambulatory Visit: Payer: Self-pay | Admitting: Occupational Medicine

## 2013-08-07 DIAGNOSIS — M25522 Pain in left elbow: Secondary | ICD-10-CM

## 2013-11-02 ENCOUNTER — Ambulatory Visit (INDEPENDENT_AMBULATORY_CARE_PROVIDER_SITE_OTHER): Payer: BC Managed Care – PPO | Admitting: Internal Medicine

## 2013-11-02 ENCOUNTER — Encounter: Payer: Self-pay | Admitting: Internal Medicine

## 2013-11-02 VITALS — BP 116/78 | HR 80 | Temp 98.1°F | Resp 16 | Ht 67.5 in | Wt 168.2 lb

## 2013-11-02 DIAGNOSIS — Z111 Encounter for screening for respiratory tuberculosis: Secondary | ICD-10-CM

## 2013-11-02 DIAGNOSIS — R6889 Other general symptoms and signs: Secondary | ICD-10-CM

## 2013-11-02 DIAGNOSIS — R7309 Other abnormal glucose: Secondary | ICD-10-CM

## 2013-11-02 DIAGNOSIS — Z0001 Encounter for general adult medical examination with abnormal findings: Secondary | ICD-10-CM

## 2013-11-02 DIAGNOSIS — R740 Nonspecific elevation of levels of transaminase and lactic acid dehydrogenase [LDH]: Secondary | ICD-10-CM

## 2013-11-02 DIAGNOSIS — Z23 Encounter for immunization: Secondary | ICD-10-CM

## 2013-11-02 DIAGNOSIS — Z113 Encounter for screening for infections with a predominantly sexual mode of transmission: Secondary | ICD-10-CM

## 2013-11-02 DIAGNOSIS — Z125 Encounter for screening for malignant neoplasm of prostate: Secondary | ICD-10-CM

## 2013-11-02 DIAGNOSIS — R7402 Elevation of levels of lactic acid dehydrogenase (LDH): Secondary | ICD-10-CM

## 2013-11-02 DIAGNOSIS — E559 Vitamin D deficiency, unspecified: Secondary | ICD-10-CM

## 2013-11-02 DIAGNOSIS — R030 Elevated blood-pressure reading, without diagnosis of hypertension: Secondary | ICD-10-CM

## 2013-11-02 DIAGNOSIS — E782 Mixed hyperlipidemia: Secondary | ICD-10-CM

## 2013-11-02 DIAGNOSIS — Z1212 Encounter for screening for malignant neoplasm of rectum: Secondary | ICD-10-CM

## 2013-11-02 LAB — CBC WITH DIFFERENTIAL/PLATELET
BASOS ABS: 0.1 10*3/uL (ref 0.0–0.1)
Basophils Relative: 1 % (ref 0–1)
EOS ABS: 0.2 10*3/uL (ref 0.0–0.7)
Eosinophils Relative: 2 % (ref 0–5)
HCT: 41.7 % (ref 39.0–52.0)
Hemoglobin: 14 g/dL (ref 13.0–17.0)
Lymphocytes Relative: 27 % (ref 12–46)
Lymphs Abs: 2.1 10*3/uL (ref 0.7–4.0)
MCH: 30.7 pg (ref 26.0–34.0)
MCHC: 33.6 g/dL (ref 30.0–36.0)
MCV: 91.4 fL (ref 78.0–100.0)
Monocytes Absolute: 0.6 10*3/uL (ref 0.1–1.0)
Monocytes Relative: 8 % (ref 3–12)
NEUTROS ABS: 4.9 10*3/uL (ref 1.7–7.7)
NEUTROS PCT: 62 % (ref 43–77)
PLATELETS: 321 10*3/uL (ref 150–400)
RBC: 4.56 MIL/uL (ref 4.22–5.81)
RDW: 14.1 % (ref 11.5–15.5)
WBC: 7.9 10*3/uL (ref 4.0–10.5)

## 2013-11-02 NOTE — Progress Notes (Signed)
Patient ID: Dillon [name], male   DOB: 27-Oct-1955, 58 y.o.   MRN: [id number]  Schedule was running behind & after labs, EKG , [place],   patient had to leave for a meeting and was rescheduled for exam

## 2013-11-03 LAB — BASIC METABOLIC PANEL WITH GFR
BUN: 8 mg/dL (ref 6–23)
CALCIUM: 9.5 mg/dL (ref 8.4–10.5)
CO2: 26 mEq/L (ref 19–32)
Chloride: 100 mEq/L (ref 96–112)
Creat: 0.91 mg/dL (ref 0.50–1.35)
Glucose, Bld: 94 mg/dL (ref 70–99)
Potassium: 4 mEq/L (ref 3.5–5.3)
Sodium: 138 mEq/L (ref 135–145)

## 2013-11-03 LAB — MICROALBUMIN / CREATININE URINE RATIO
Creatinine, Urine: 388.7 mg/dL
Microalb Creat Ratio: 2.3 mg/g (ref 0.0–30.0)
Microalb, Ur: 0.9 mg/dL (ref <2.0)

## 2013-11-03 LAB — TSH: TSH: 1.217 u[IU]/mL (ref 0.350–4.500)

## 2013-11-03 LAB — HEPATIC FUNCTION PANEL
ALT: 19 U/L (ref 0–53)
AST: 23 U/L (ref 0–37)
Albumin: 4.2 g/dL (ref 3.5–5.2)
Alkaline Phosphatase: 61 U/L (ref 39–117)
BILIRUBIN DIRECT: 0.1 mg/dL (ref 0.0–0.3)
Indirect Bilirubin: 0.4 mg/dL (ref 0.2–1.2)
Total Bilirubin: 0.5 mg/dL (ref 0.2–1.2)
Total Protein: 7 g/dL (ref 6.0–8.3)

## 2013-11-03 LAB — URINALYSIS, MICROSCOPIC ONLY
Bacteria, UA: NONE SEEN
CASTS: NONE SEEN
CRYSTALS: NONE SEEN
Squamous Epithelial / LPF: NONE SEEN

## 2013-11-03 LAB — HEMOGLOBIN A1C
HEMOGLOBIN A1C: 5.6 % (ref <5.7)
Mean Plasma Glucose: 114 mg/dL (ref <117)

## 2013-11-03 LAB — LIPID PANEL
CHOL/HDL RATIO: 3.2 ratio
CHOLESTEROL: 211 mg/dL — AB (ref 0–200)
HDL: 65 mg/dL (ref >39)
LDL Cholesterol: 114 mg/dL — ABNORMAL HIGH (ref 0–99)
TRIGLYCERIDES: 159 mg/dL — AB (ref <150)
VLDL: 32 mg/dL (ref 0–40)

## 2013-11-03 LAB — TESTOSTERONE: TESTOSTERONE: 301 ng/dL (ref 300–890)

## 2013-11-03 LAB — INSULIN, FASTING: Insulin fasting, serum: 4.5 u[IU]/mL (ref 2.0–19.6)

## 2013-11-03 LAB — VITAMIN B12: VITAMIN B 12: 568 pg/mL (ref 211–911)

## 2013-11-03 LAB — PSA: PSA: 1.63 ng/mL (ref <=4.00)

## 2013-11-03 LAB — VITAMIN D 25 HYDROXY (VIT D DEFICIENCY, FRACTURES): VIT D 25 HYDROXY: 51 ng/mL (ref 30–89)

## 2013-11-03 LAB — MAGNESIUM: MAGNESIUM: 1.8 mg/dL (ref 1.5–2.5)

## 2013-11-05 LAB — TB SKIN TEST
INDURATION: 0 mm
TB SKIN TEST: NEGATIVE

## 2013-11-06 ENCOUNTER — Telehealth: Payer: Self-pay | Admitting: Internal Medicine

## 2013-11-06 NOTE — Telephone Encounter (Signed)
PATIENT WILL CALL BACK TO [place] [facility] TO COMPLETE CPE FROM 11-02-13.  PATIENT SAID UNABLE TO [facility] UNTIL NOVEMBER.

## 2013-12-12 ENCOUNTER — Encounter: Payer: Self-pay | Admitting: Internal Medicine

## 2013-12-12 ENCOUNTER — Ambulatory Visit: Payer: BC Managed Care – PPO | Admitting: Internal Medicine

## 2013-12-12 VITALS — BP 108/64 | HR 76 | Temp 97.4°F | Resp 16 | Ht 67.5 in | Wt 167.4 lb

## 2013-12-12 DIAGNOSIS — E782 Mixed hyperlipidemia: Secondary | ICD-10-CM

## 2013-12-12 DIAGNOSIS — E559 Vitamin D deficiency, unspecified: Secondary | ICD-10-CM

## 2013-12-12 DIAGNOSIS — Z1212 Encounter for screening for malignant neoplasm of rectum: Secondary | ICD-10-CM

## 2013-12-12 DIAGNOSIS — R7309 Other abnormal glucose: Secondary | ICD-10-CM

## 2013-12-12 DIAGNOSIS — K51918 Ulcerative colitis, unspecified with other complication: Secondary | ICD-10-CM

## 2013-12-12 DIAGNOSIS — Z113 Encounter for screening for infections with a predominantly sexual mode of transmission: Secondary | ICD-10-CM

## 2013-12-12 DIAGNOSIS — Z125 Encounter for screening for malignant neoplasm of prostate: Secondary | ICD-10-CM

## 2013-12-12 DIAGNOSIS — F411 Generalized anxiety disorder: Secondary | ICD-10-CM

## 2013-12-12 DIAGNOSIS — Z0001 Encounter for general adult medical examination with abnormal findings: Secondary | ICD-10-CM

## 2013-12-12 DIAGNOSIS — R030 Elevated blood-pressure reading, without diagnosis of hypertension: Secondary | ICD-10-CM

## 2013-12-12 MED ORDER — ALPRAZOLAM 0.5 MG PO TABS: ORAL_TABLET | ORAL | Status: AC

## 2013-12-12 MED ORDER — DIPHENOXYLATE-ATROPINE 2.5-0.025 MG PO TABS: ORAL_TABLET | ORAL | Status: AC

## 2013-12-12 MED ORDER — MELOXICAM 15 MG PO TABS: 15.0000 mg | ORAL_TABLET | Freq: Every day | ORAL | Status: AC

## 2013-12-12 MED ORDER — CHOLESTYRAMINE 4 G PO PACK: 1.0000 | PACK | Freq: Two times a day (BID) | ORAL | Status: AC

## 2013-12-12 NOTE — Progress Notes (Signed)
Patient ID: Dillon [name], male   DOB: 10/21/1955, 58 y.o.   MRN: [id number]  Annual Screening Comprehensive Examination  This very nice 58 y.o.MWM presents for complete physical.  Patient has been followed for Labile HTN, Prediabetes, Hyperlipidemia, and Vitamin D Deficiency.Patient has hx/o Ulcerative colitis for which he underwent total colectomy in 2000. Patient reports he still has up to 10-15 watery BM's daily and has had little benefit with Immodium or Lomotil.   Patient has hx/o elevated BP and is monitored expectantly. Patient's BP has been controlled and today's BP: 108/64 mmHg. Patient denies any cardiac symptoms as chest pain, palpitations, shortness of breath, dizziness or ankle swelling.   Patient's hyperlipidemia is not controlled with diet. Last lipids were near, but not quite at goal - Total Chol 211; HDL 65; LDL 114; Trig 159 on 11/02/2013.   Patient has prediabetes since Sept ,2013 (A1c 5.7%)  and patient denies reactive hypoglycemic symptoms, visual blurring, diabetic polys or paresthesias. Last A1c was 5.6% on 11/02/2013.   Patient has deferred treatment of Low T with recent testosterone level 301 on 02 Nov 2013. Finally, patient has history of Vitamin D Deficiency of 44 in 2013 and last vitamin D was  51 on 11/02/2013.  Medication Sig  . B Complex-C (B-COMPLEX WITH VITAMIN C) tablet Take 1 tablet by mouth daily.  Marland Kitchen HYDROcodone-acetaminophen (NORCO) 5-325 MG per tablet Take 1 tablet by mouth every 6 (six) hours as needed for moderate pain.  . Ibuprofen (ADVIL) 200 MG CAPS Take by mouth as needed.  . Multiple Vitamins-Minerals (MULTIVITAMIN WITH MINERALS) tablet Take 1 tablet by mouth daily.   Allergies  Allergen Reactions  . Norco [Hydrocodone-Acetaminophen]   . Penicillins    Past Medical History  Diagnosis Date  . Hypertension   . DJD (degenerative joint disease)   . Hyperlipidemia   . Elevated hemoglobin A1c    Health Maintenance  Topic Date Due  . COLONOSCOPY   11/25/2005  . INFLUENZA VACCINE  09/02/2014  . TETANUS/TDAP  02/07/2023   Immunization History  Administered Date(s) Administered  . Influenza Split 11/02/2013  . PPD Test 11/02/2013  . Tdap 02/06/2013   Past Surgical History  Procedure Laterality Date  . Hand surgery Right 1975  . Elbow surgery Right 2014  . Colon surgery  2000, reversal 2001    coloectomy, with reversal and now a J pouch   Family History  Problem Relation Age of Onset  . Diabetes Mother   . Arthritis Mother     Rheumatoid  . Heart disease Father   . Gout Father   . Cancer Father     melanoma  . Cancer Sister     Breast   History   Social History  . Marital Status: Married    Spouse Name: N/A    Number of Children: N/A  . Years of Education: N/A   Social History Main Topics  . Smoking status: Never Smoker   . Smokeless tobacco: Not on file  . Alcohol Use: 7.0 oz/week    14 drink(s) per week  . Drug Use: Not on file  . Sexual Activity: Not on file    ROS Constitutional: Denies fever, chills, weight loss/gain, headaches, insomnia, fatigue, night sweats or change in appetite. Eyes: Denies redness, blurred vision, diplopia, discharge, itchy or watery eyes.  ENT: Denies discharge, congestion, post nasal drip, epistaxis, sore throat, earache, hearing loss, dental pain, Tinnitus, Vertigo, Sinus pain or snoring.  Cardio: Denies chest pain, palpitations, irregular heartbeat,  syncope, dyspnea, diaphoresis, orthopnea, PND, claudication or edema Respiratory: denies cough, dyspnea, DOE, pleurisy, hoarseness, laryngitis or wheezing.  Gastrointestinal: Denies dysphagia, heartburn, reflux, water brash, pain, cramps, nausea, vomiting, bloating, diarrhea, constipation, hematemesis, melena, hematochezia, jaundice or hemorrhoids Genitourinary: Denies dysuria, frequency, urgency, nocturia, hesitancy, discharge, hematuria or flank pain Musculoskeletal: Denies arthralgia, myalgia, stiffness, Jt. Swelling, pain, limp  or strain/sprain. Denies Falls. Skin: Denies puritis, rash, hives, warts, acne, eczema or change in skin lesion Neuro: No weakness, tremor, incoordination, spasms, paresthesia or pain Psychiatric: Denies confusion, memory loss or sensory loss. Denies Depression. Endocrine: Denies change in weight, skin, hair change, nocturia, and paresthesia, diabetic polys, visual blurring or hyper / hypo glycemic episodes.  Heme/Lymph: No excessive bleeding, bruising or enlarged lymph nodes.  Physical Exam  BP 108/64  Pulse 76  Temp 97.4 F   Resp 16  Ht 5' 7.5"  Wt 167 lb 6.4 oz   BMI 25.82 kg/m2  General Appearance: Well nourished, in no apparent distress. Eyes: PERRLA, EOMs, conjunctiva no swelling or erythema, normal fundi and vessels. Sinuses: No frontal/maxillary tenderness ENT/Mouth: EACs patent / TMs  nl. Nares clear without erythema, swelling, mucoid exudates. Oral hygiene is good. No erythema, swelling, or exudate. Tongue normal, non-obstructing. Tonsils not swollen or erythematous. Hearing normal.  Neck: Supple, thyroid normal. No bruits, nodes or JVD. Respiratory: Respiratory effort normal.  BS equal and clear bilateral without rales, rhonci, wheezing or stridor. Cardio: Heart sounds are normal with regular rate and rhythm and no murmurs, rubs or gallops. Peripheral pulses are normal and equal bilaterally without edema. No aortic or femoral bruits. Chest: symmetric with normal excursions and percussion.  Abdomen: Flat, soft, with bowl sounds. Nontender, no guarding, rebound, hernias, masses, or organomegaly.  Lymphatics: Non tender without lymphadenopathy.  Genitourinary: deferred Musculoskeletal: Full ROM all peripheral extremities, joint stability, 5/5 strength, and normal gait. Skin: Warm and dry without rashes, lesions, cyanosis, clubbing or  ecchymosis.  Neuro: Cranial nerves intact, reflexes equal bilaterally. Normal muscle tone, no cerebellar symptoms. Sensation intact.  Pysch:  Awake and oriented X 3with normal affect, insight and judgment appropriate.   Assessment and Plan  1. Annual Screening Examination 2. Elevated BP, hx/o 3. Hyperlipidemia 4. Pre Diabetes 5. Vitamin D Deficiency 6. Testosterone Deficiency   Continue prudent diet as discussed, weight control, BP monitoring, regular exercise, and medications as discussed.  Discussed med effects and SE's. Routine screening labs and tests as requested with regular follow-up as recommended.

## 2013-12-12 NOTE — Patient Instructions (Signed)
Recommend the book "The END of DIETING" by Dr Baker Janus   and the book "The END of DIABETES " by Dr Excell Seltzer  At [facility].com - get book & Audio CD's      Being diabetic has a  300% increased risk for heart attack, stroke, cancer, and alzheimer- type vascular dementia. It is very important that you work harder with diet by avoiding all foods that are white except chicken & fish. Avoid white rice (brown & wild rice is OK), white potatoes (sweetpotatoes in moderation is OK), White bread or wheat bread or anything made out of white flour like bagels, donuts, rolls, buns, biscuits, cakes, pastries, cookies, pizza crust, and pasta (made from white flour & egg whites) - vegetarian pasta or spinach or wheat pasta is OK. Multigrain breads like Arnold's or Pepperidge Farm, or multigrain sandwich thins or flatbreads.  Diet, exercise and weight loss can reverse and cure diabetes in the early stages.  Diet, exercise and weight loss is very important in the control and prevention of complications of diabetes which affects every system in your body, ie. Brain - dementia/stroke, eyes - glaucoma/blindness, heart - heart attack/heart failure, kidneys - dialysis, stomach - gastric paralysis, intestines - malabsorption, nerves - severe painful neuritis, circulation - gangrene & loss of a leg(s), and finally cancer and Alzheimers.    I recommend avoid fried & greasy foods,  sweets/candy, white rice (brown or wild rice or Quinoa is OK), white potatoes (sweet potatoes are OK) - anything made from white flour - bagels, doughnuts, rolls, buns, biscuits,white and wheat breads, pizza crust and traditional pasta made of white flour & egg white(vegetarian pasta or spinach or wheat pasta is OK).  Multi-grain bread is OK - like multi-grain flat bread or sandwich thins. Avoid alcohol in excess. Exercise is also important.    Eat all the vegetables you want - avoid meat, especially red meat and dairy - especially cheese.  Cheese  is the most concentrated form of trans-fats which is the worst thing to clog up our arteries. Veggie cheese is OK which can be found in the fresh produce section at [employer] or [employer] or [employer]  Preventive Care for Adults  A healthy lifestyle and preventive care can promote health and wellness. Preventive health guidelines for men include the following key practices:  A routine yearly physical is a good way to check with your health care provider about your health and preventative screening. It is a chance to share any concerns and updates on your health and to receive a thorough exam.  Visit your dentist for a routine exam and preventative care every 6 months. Brush your teeth twice a day and floss once a day. Good oral hygiene prevents tooth decay and gum disease.  The frequency of eye exams is based on your age, health, family medical history, use of contact lenses, and other factors. Follow your health care provider's recommendations for frequency of eye exams.  Eat a healthy diet. Foods such as vegetables, fruits, whole grains, low-fat dairy products, and lean protein foods contain the nutrients you need without too many calories. Decrease your intake of foods high in solid fats, added sugars, and salt. Eat the right amount of calories for you.Get information about a proper diet from your health care provider, if necessary.  Regular physical exercise is one of the most important things you can do for your health. Most adults should get at least 150 minutes of moderate-intensity exercise (any activity  that increases your heart rate and causes you to sweat) each week. In addition, most adults need muscle-strengthening exercises on 2 or more days a week.  Maintain a healthy weight. The body mass index (BMI) is a screening tool to identify possible weight problems. It provides an estimate of body fat based on height and weight. Your health care provider can find your BMI and can help  you achieve or maintain a healthy weight.For adults 20 years and older:  A BMI below 18.5 is considered underweight.  A BMI of 18.5 to 24.9 is normal.  A BMI of 25 to 29.9 is considered overweight.  A BMI of 30 and above is considered obese.  Maintain normal blood lipids and cholesterol levels by exercising and minimizing your intake of saturated fat. Eat a balanced diet with plenty of fruit and vegetables. Blood tests for lipids and cholesterol should begin at age 17 and be repeated every 5 years. If your lipid or cholesterol levels are high, you are over 50, or you are at high risk for heart disease, you may need your cholesterol levels checked more frequently.Ongoing high lipid and cholesterol levels should be treated with medicines if diet and exercise are not working.  If you smoke, find out from your health care provider how to quit. If you do not use tobacco, do not start.  Lung cancer screening is recommended for adults aged 65-80 years who are at high risk for developing lung cancer because of a history of smoking. A yearly low-dose CT scan of the lungs is recommended for people who have at least a 30-pack-year history of smoking and are a current smoker or have quit within the past 15 years. A pack year of smoking is smoking an average of 1 pack of cigarettes a day for 1 year (for example: 1 pack a day for 30 years or 2 packs a day for 15 years). Yearly screening should continue until the smoker has stopped smoking for at least 15 years. Yearly screening should be stopped for people who develop a health problem that would prevent them from having lung cancer treatment.  If you choose to drink alcohol, do not have more than 2 drinks per day. One drink is considered to be 12 ounces (355 mL) of beer, 5 ounces (148 mL) of wine, or 1.5 ounces (44 mL) of liquor.  Avoid use of street drugs. Do not share needles with anyone. Ask for help if you need support or instructions about stopping the  use of drugs.  High blood pressure causes heart disease and increases the risk of stroke. Your blood pressure should be checked at least every 1-2 years. Ongoing high blood pressure should be treated with medicines, if weight loss and exercise are not effective.  If you are 63-33 years old, ask your health care provider if you should take aspirin to prevent heart disease.  Diabetes screening involves taking a blood sample to check your fasting blood sugar level. This should be done once every 3 years, after age 23, if you are within normal weight and without risk factors for diabetes. Testing should be considered at a younger age or be carried out more frequently if you are overweight and have at least 1 risk factor for diabetes.  Colorectal cancer can be detected and often prevented. Most routine colorectal cancer screening begins at the age of 63 and continues through age 92. However, your health care provider may recommend screening at an earlier age if you have  risk factors for colon cancer. On a yearly basis, your health care provider may provide home test kits to check for hidden blood in the stool. Use of a small camera at the end of a tube to directly examine the colon (sigmoidoscopy or colonoscopy) can detect the earliest forms of colorectal cancer. Talk to your health care provider about this at age 42, when routine screening begins. Direct exam of the colon should be repeated every 5-10 years through age 26, unless early forms of precancerous polyps or small growths are found.   Talk with your health care provider about prostate cancer screening.  Testicular cancer screening isrecommended for adult males. Screening includes self-exam, a health care provider exam, and other screening tests. Consult with your health care provider about any symptoms you have or any concerns you have about testicular cancer.  Use sunscreen. Apply sunscreen liberally and repeatedly throughout the day. You should  seek shade when your shadow is shorter than you. Protect yourself by wearing long sleeves, pants, a wide-brimmed hat, and sunglasses year round, whenever you are outdoors.  Once a month, do a whole-body skin exam, using a mirror to look at the skin on your back. Tell your health care provider about new moles, moles that have irregular borders, moles that are larger than a pencil eraser, or moles that have changed in shape or color.  Stay current with required vaccines (immunizations).  Influenza vaccine. All adults should be immunized every year.  Tetanus, diphtheria, and acellular pertussis (Td, Tdap) vaccine. An adult who has not previously received Tdap or who does not know his vaccine status should receive 1 dose of Tdap. This initial dose should be followed by tetanus and diphtheria toxoids (Td) booster doses every 10 years. Adults with an unknown or incomplete history of completing a 3-dose immunization series with Td-containing vaccines should begin or complete a primary immunization series including a Tdap dose. Adults should receive a Td booster every 10 years.  Varicella vaccine. An adult without evidence of immunity to varicella should receive 2 doses or a second dose if he has previously received 1 dose.  Human papillomavirus (HPV) vaccine. Males aged 109-21 years who have not received the vaccine previously should receive the 3-dose series. Males aged 22-26 years may be immunized. Immunization is recommended through the age of 70 years for any male who has sex with males and did not get any or all doses earlier. Immunization is recommended for any person with an immunocompromised condition through the age of 74 years if he did not get any or all doses earlier. During the 3-dose series, the second dose should be obtained 4-8 weeks after the first dose. The third dose should be obtained 24 weeks after the first dose and 16 weeks after the second dose.  Zoster vaccine. One dose is recommended  for adults aged 87 years or older unless certain conditions are present.    PREVNAR  - Pneumococcal 13-valent conjugate (PCV13) vaccine. When indicated, a person who is uncertain of his immunization history and has no record of immunization should receive the PCV13 vaccine. An adult aged 77 years or older who has certain medical conditions and has not been previously immunized should receive 1 dose of PCV13 vaccine. This PCV13 should be followed with a dose of pneumococcal polysaccharide (PPSV23) vaccine. The PPSV23 vaccine dose should be obtained at least 8 weeks after the dose of PCV13 vaccine. An adult aged 45 years or older who has certain medical conditions and previously  received 1 or more doses of PPSV23 vaccine should receive 1 dose of PCV13. The PCV13 vaccine dose should be obtained 1 or more years after the last PPSV23 vaccine dose.    PNEUMOVAX - Pneumococcal polysaccharide (PPSV23) vaccine. When PCV13 is also indicated, PCV13 should be obtained first. All adults aged 85 years and older should be immunized. An adult younger than age 42 years who has certain medical conditions should be immunized. Any person who resides in a nursing home or long-term care facility should be immunized. An adult smoker should be immunized. People with an immunocompromised condition and certain other conditions should receive both PCV13 and PPSV23 vaccines. People with human immunodeficiency virus (HIV) infection should be immunized as soon as possible after diagnosis. Immunization during chemotherapy or radiation therapy should be avoided. Routine use of PPSV23 vaccine is not recommended for American Indians, [place] Natives, or people younger than 65 years unless there are medical conditions that require PPSV23 vaccine. When indicated, people who have unknown immunization and have no record of immunization should receive PPSV23 vaccine. One-time revaccination 5 years after the first dose of PPSV23 is recommended for  people aged 19-64 years who have chronic kidney failure, nephrotic syndrome, asplenia, or immunocompromised conditions. People who received 1-2 doses of PPSV23 before age 100 years should receive another dose of PPSV23 vaccine at age 15 years or later if at least 5 years have passed since the previous dose. Doses of PPSV23 are not needed for people immunized with PPSV23 at or after age 26 years.    Hepatitis A vaccine. Adults who wish to be protected from this disease, have certain high-risk conditions, work with hepatitis A-infected animals, work in hepatitis A research labs, or travel to or work in countries with a high rate of hepatitis A should be immunized. Adults who were previously unvaccinated and who anticipate close contact with an international adoptee during the first 60 days after arrival in the [place] from a country with a high rate of hepatitis A should be immunized.    Hepatitis B vaccine. Adults should be immunized if they wish to be protected from this disease, have certain high-risk conditions, may be exposed to blood or other infectious body fluids, are household contacts or sex partners of hepatitis B positive people, are clients or workers in certain care facilities, or travel to or work in countries with a high rate of hepatitis B.   Preventive Service / Frequency   Ages 42 to 32  Blood pressure check.  Lipid and cholesterol check  Lung cancer screening. / Every year if you are aged 37-80 years and have a 30-pack-year history of smoking and currently smoke or have quit within the past 15 years. Yearly screening is stopped once you have quit smoking for at least 15 years or develop a health problem that would prevent you from having lung cancer treatment.  Fecal occult blood test (FOBT) of stool. / Every year beginning at age 45 and continuing until age 57. You may not have to do this test if you get a colonoscopy every 10 years.  Flexible sigmoidoscopy** or  colonoscopy.** / Every 5 years for a flexible sigmoidoscopy or every 10 years for a colonoscopy beginning at age 48 and continuing until age 26. Screening for abdominal aortic aneurysm (AAA)  by ultrasound is recommended for people who have history of high blood pressure or who are current or former smokers.

## 2014-10-03 HISTORY — PX: ELBOW LIGAMENT RECONSTRUCTION: SHX6359

## 2014-11-07 ENCOUNTER — Ambulatory Visit (INDEPENDENT_AMBULATORY_CARE_PROVIDER_SITE_OTHER): Payer: BC Managed Care – PPO | Admitting: Internal Medicine

## 2014-11-07 ENCOUNTER — Encounter: Payer: Self-pay | Admitting: Internal Medicine

## 2014-11-07 VITALS — BP 120/76 | HR 76 | Temp 97.5°F | Resp 16 | Ht 67.75 in | Wt 166.2 lb

## 2014-11-07 DIAGNOSIS — Z23 Encounter for immunization: Secondary | ICD-10-CM

## 2014-11-07 DIAGNOSIS — Z79899 Other long term (current) drug therapy: Secondary | ICD-10-CM | POA: Diagnosis not present

## 2014-11-07 DIAGNOSIS — Z111 Encounter for screening for respiratory tuberculosis: Secondary | ICD-10-CM | POA: Diagnosis not present

## 2014-11-07 DIAGNOSIS — Z125 Encounter for screening for malignant neoplasm of prostate: Secondary | ICD-10-CM

## 2014-11-07 DIAGNOSIS — Z1212 Encounter for screening for malignant neoplasm of rectum: Secondary | ICD-10-CM

## 2014-11-07 DIAGNOSIS — Z0000 Encounter for general adult medical examination without abnormal findings: Secondary | ICD-10-CM | POA: Diagnosis not present

## 2014-11-07 DIAGNOSIS — E559 Vitamin D deficiency, unspecified: Secondary | ICD-10-CM | POA: Diagnosis not present

## 2014-11-07 DIAGNOSIS — Z6826 Body mass index (BMI) 26.0-26.9, adult: Secondary | ICD-10-CM | POA: Insufficient documentation

## 2014-11-07 DIAGNOSIS — Z0001 Encounter for general adult medical examination with abnormal findings: Secondary | ICD-10-CM

## 2014-11-07 DIAGNOSIS — R5383 Other fatigue: Secondary | ICD-10-CM

## 2014-11-07 DIAGNOSIS — R030 Elevated blood-pressure reading, without diagnosis of hypertension: Secondary | ICD-10-CM

## 2014-11-07 DIAGNOSIS — E782 Mixed hyperlipidemia: Secondary | ICD-10-CM

## 2014-11-07 DIAGNOSIS — R7309 Other abnormal glucose: Secondary | ICD-10-CM

## 2014-11-07 DIAGNOSIS — R197 Diarrhea, unspecified: Secondary | ICD-10-CM

## 2014-11-07 DIAGNOSIS — Z6825 Body mass index (BMI) 25.0-25.9, adult: Secondary | ICD-10-CM

## 2014-11-07 LAB — CBC WITH DIFFERENTIAL/PLATELET
BASOS ABS: 0.1 10*3/uL (ref 0.0–0.1)
BASOS PCT: 1 % (ref 0–1)
EOS ABS: 0.1 10*3/uL (ref 0.0–0.7)
Eosinophils Relative: 2 % (ref 0–5)
HCT: 40.4 % (ref 39.0–52.0)
Hemoglobin: 13.7 g/dL (ref 13.0–17.0)
LYMPHS ABS: 2.1 10*3/uL (ref 0.7–4.0)
Lymphocytes Relative: 29 % (ref 12–46)
MCH: 30.4 pg (ref 26.0–34.0)
MCHC: 33.9 g/dL (ref 30.0–36.0)
MCV: 89.8 fL (ref 78.0–100.0)
MPV: 9.9 fL (ref 8.6–12.4)
Monocytes Absolute: 0.6 10*3/uL (ref 0.1–1.0)
Monocytes Relative: 8 % (ref 3–12)
NEUTROS PCT: 60 % (ref 43–77)
Neutro Abs: 4.3 10*3/uL (ref 1.7–7.7)
PLATELETS: 388 10*3/uL (ref 150–400)
RBC: 4.5 MIL/uL (ref 4.22–5.81)
RDW: 13.8 % (ref 11.5–15.5)
WBC: 7.1 10*3/uL (ref 4.0–10.5)

## 2014-11-07 LAB — HEPATIC FUNCTION PANEL
ALK PHOS: 62 U/L (ref 40–115)
ALT: 19 U/L (ref 9–46)
AST: 15 U/L (ref 10–35)
Albumin: 4.2 g/dL (ref 3.6–5.1)
BILIRUBIN DIRECT: 0.1 mg/dL (ref <=0.2)
BILIRUBIN TOTAL: 0.6 mg/dL (ref 0.2–1.2)
Indirect Bilirubin: 0.5 mg/dL (ref 0.2–1.2)
Total Protein: 7 g/dL (ref 6.1–8.1)

## 2014-11-07 LAB — IRON AND TIBC
%SAT: 18 % (ref 15–60)
Iron: 79 ug/dL (ref 50–180)
TIBC: 448 ug/dL — AB (ref 250–425)
UIBC: 369 ug/dL (ref 125–400)

## 2014-11-07 LAB — BASIC METABOLIC PANEL WITH GFR
BUN: 15 mg/dL (ref 7–25)
CHLORIDE: 101 mmol/L (ref 98–110)
CO2: 27 mmol/L (ref 20–31)
Calcium: 9.1 mg/dL (ref 8.6–10.3)
Creat: 0.74 mg/dL (ref 0.70–1.33)
Glucose, Bld: 89 mg/dL (ref 65–99)
POTASSIUM: 4.4 mmol/L (ref 3.5–5.3)
SODIUM: 137 mmol/L (ref 135–146)

## 2014-11-07 LAB — LIPID PANEL
CHOL/HDL RATIO: 3.6 ratio (ref <=5.0)
Cholesterol: 181 mg/dL (ref 125–200)
HDL: 50 mg/dL (ref >=40)
LDL CALC: 111 mg/dL (ref <130)
TRIGLYCERIDES: 99 mg/dL (ref <150)
VLDL: 20 mg/dL (ref <30)

## 2014-11-07 LAB — MAGNESIUM: Magnesium: 1.9 mg/dL (ref 1.5–2.5)

## 2014-11-07 LAB — TSH: TSH: 1.107 u[IU]/mL (ref 0.350–4.500)

## 2014-11-07 LAB — HEMOGLOBIN A1C
Hgb A1c MFr Bld: 5.8 % — ABNORMAL HIGH (ref <5.7)
Mean Plasma Glucose: 120 mg/dL — ABNORMAL HIGH (ref <117)

## 2014-11-07 LAB — VITAMIN B12: Vitamin B-12: 337 pg/mL (ref 211–911)

## 2014-11-07 MED ORDER — DIPHENOXYLATE-ATROPINE 2.5-0.025 MG PO TABS: 1.0000 | ORAL_TABLET | Freq: Four times a day (QID) | ORAL | Status: DC | PRN

## 2014-11-07 NOTE — Progress Notes (Signed)
Patient ID: Dillon [name], male   DOB: [birth year], 59 y.o.   MRN: [id number]   Comprehensive Examination     This very nice 59 y.o. MWM presents for complete physical.  Patient has been followed for Labile HTN, Prediabetes, Hyperlipidemia, and Vitamin D Deficiency. In 2000, patient underwent total colectomy for Ulcerative Colitis and reports 10-15 watery BM's /day.      Labile HTN predates since 2013 with recorded BP 1444/84. Patient's BP has been controlled at home.  Today's BP: 120/76 mmHg. Patient denies any cardiac symptoms as chest pain, palpitations, shortness of breath, dizziness or ankle swelling.     Patient's hyperlipidemia is controlled with diet and medications. Patient denies myalgias or other medication SE's. Last lipids were not at goal with Total Chol 211, HDL 65, trig 159 and LDL 114 in Oct 2016.     Patient has prediabetes since 2013 with A1c 5.7% and patient denies reactive hypoglycemic symptoms, visual blurring, diabetic polys or paresthesias. Last A1c was 5.6% in Oct 2015.             Finally, patient has history of Vitamin D Deficiency of 44 on treatment in 2013 and last vitamin D was 51 in Oct 2015.      Medication Sig  . B Complex-C  Take 1 tablet by mouth daily.  . NORCO 5-325 MG Take 1 tablet by mouth every 6 (six) hours as needed for moderate pain.  . Ibuprofen  200 MG CAPS Take by mouth as needed.  . meloxicam  15 MG tablet Take 1 tablet (15 mg total) by mouth daily.  . Multiple Vitamins-Minerals  Take 1 tablet by mouth daily.   Allergies  Allergen Reactions  . Norco [Hydrocodone-Acetaminophen]   . Penicillins    Past Medical History  Diagnosis Date  . Hypertension   . DJD (degenerative joint disease)   . Hyperlipidemia   . Elevated hemoglobin A1c    Health Maintenance  Topic Date Due  . Hepatitis C Screening  19-Aug-1955  . HIV Screening  11/26/1970  . COLONOSCOPY  11/25/2005  . INFLUENZA VACCINE  09/02/2014  . TETANUS/TDAP  02/07/2023    Immunization History  Administered Date(s) Administered  . Influenza Split 11/02/2013  . PPD Test 11/02/2013  . Tdap 02/06/2013   Past Surgical History  Procedure Laterality Date  . Hand surgery Right 1975  . Elbow surgery Right 2014  . Colon surgery  2000, reversal 2001    coloectomy, with reversal and now a J pouch   Family History  Problem Relation Age of Onset  . Diabetes Mother   . Arthritis Mother     Rheumatoid  . Heart disease Father   . Gout Father   . Cancer Father     melanoma  . Cancer Sister     Breast   Social History   Social History  . Marital Status: Married    Spouse Name: N/A  . Number of Children: N/A  . Years of Education: N/A   Occupational History  . Not on file.   Social History Main Topics  . Smoking status: Never Smoker   . Smokeless tobacco: Not on file  . Alcohol Use: 7.0 oz/week    14 drink(s) per week  . Drug Use: Not on file  . Sexual Activity: Not on file    ROS Constitutional: Denies fever, chills, weight loss/gain, headaches, insomnia,  night sweats or change in appetite. Does c/o fatigue. Eyes: Denies redness, blurred vision, diplopia, discharge, itchy  or watery eyes.  ENT: Denies discharge, congestion, post nasal drip, epistaxis, sore throat, earache, hearing loss, dental pain, Tinnitus, Vertigo, Sinus pain or snoring.  Cardio: Denies chest pain, palpitations, irregular heartbeat, syncope, dyspnea, diaphoresis, orthopnea, PND, claudication or edema Respiratory: denies cough, dyspnea, DOE, pleurisy, hoarseness, laryngitis or wheezing.  Gastrointestinal: Denies dysphagia, heartburn, reflux, water brash, pain, cramps, nausea, vomiting, bloating, constipation, hematemesis, melena, hematochezia, jaundice or hemorrhoids Genitourinary: Denies dysuria, frequency, urgency, nocturia, hesitancy, discharge, hematuria or flank pain Musculoskeletal: Denies arthralgia, myalgia, stiffness, Jt. Swelling, pain, limp or strain/sprain. Denies  Falls. Skin: Denies puritis, rash, hives, warts, acne, eczema or change in skin lesion Neuro: No weakness, tremor, incoordination, spasms, paresthesia or pain Psychiatric: Denies confusion, memory loss or sensory loss. Denies Depression. Endocrine: Denies change in weight, skin, hair change, nocturia, and paresthesia, diabetic polys, visual blurring or hyper / hypo glycemic episodes.  Heme/Lymph: No excessive bleeding, bruising or enlarged lymph nodes.  Physical Exam  BP 120/76 mmHg  Pulse 76  Temp(Src) 97.5 F (36.4 C)  Resp 16  Ht 5' 7.75" (1.721 m)  Wt 166 lb 3.2 oz (75.388 kg)  BMI 25.45 kg/m2  General Appearance: Well nourished &  in no apparent distress. Eyes: PERRLA, EOMs, conjunctiva no swelling or erythema, normal fundi and vessels. Sinuses: No frontal/maxillary tenderness ENT/Mouth: EACs patent / TMs  nl. Nares clear without erythema, swelling, mucoid exudates. Oral hygiene is good. No erythema, swelling, or exudate. Tongue normal, non-obstructing. Tonsils not swollen or erythematous. Hearing normal.  Neck: Supple, thyroid normal. No bruits, nodes or JVD. Respiratory: Respiratory effort normal.  BS equal and clear bilateral without rales, rhonci, wheezing or stridor. Cardio: Heart sounds are normal with regular rate and rhythm and no murmurs, rubs or gallops. Peripheral pulses are normal and equal bilaterally without edema. No aortic or femoral bruits. Chest: symmetric with normal excursions and percussion.  Abdomen: Flat, soft, with bowel sounds. Nontender, no guarding, rebound, hernias, masses, or organomegaly.  Lymphatics: Non tender without lymphadenopathy.  Genitourinary: No hernias.Testes nl. DRE - prostate nl for age - smooth & firm w/o nodules. Musculoskeletal: Full ROM all peripheral extremities, joint stability, 5/5 strength, and normal gait. Skin: Warm and dry without rashes, lesions, cyanosis, clubbing or  ecchymosis.  Neuro: Cranial nerves intact, reflexes equal  bilaterally. Normal muscle tone, no cerebellar symptoms. Sensation intact.  Pysch: Alert and oriented X 3 with normal affect, insight and judgment appropriate.   Assessment and Plan  1. Encounter for general adult medical examination with abnormal findings  - ALPRAZolam (XANAX) 0.5 MG tablet; Take 0.5 mg by mouth 3 (three) times daily as needed for anxiety. - Microalbumin / creatinine urine ratio - EKG 12-Lead - [place], RETROPERITNL ABD,  LTD - POC Hemoccult Bld/Stl  - Vitamin B12 - PSA - Testosterone - Iron and TIBC - Urinalysis, Routine w reflex microscopic  - CBC with Differential/Platelet - BASIC METABOLIC PANEL WITH GFR - Hepatic function panel - Magnesium - Lipid panel - TSH - Hemoglobin A1c - Insulin, random - Vit D  25 hydroxy  2. Elevated blood pressure reading without diagnosis of hypertension  - Microalbumin / creatinine urine ratio - EKG 12-Lead - [place], RETROPERITNL ABD,  LTD - TSH  3. Mixed hyperlipidemia  - Lipid panel  4. Elevated hemoglobin A1c  - Hemoglobin A1c - Insulin, random  5. Vitamin D deficiency  - Vit D  25 hydroxy  6. Special screening for malignant neoplasm of prostate  - PSA  7. Screening for malignant neoplasm of  the rectum  - POC Hemoccult Bld/Stl   8. BMI 25.0-25.9,adult   9. Other fatigue  - Vitamin B12 - Testosterone - Iron and TIBC - TSH  10. Medication management  - Urinalysis, Routine w reflex microscopic  - CBC with Differential/Platelet - BASIC METABOLIC PANEL WITH GFR - Hepatic function panel - Magnesium   Continue prudent diet as discussed, weight control, BP monitoring, regular exercise, and medications as discussed.  Discussed med effects and SE's. Routine screening labs and tests as requested with regular follow-up as recommended.  Over 40 minutes of exam, counseling &  chart review was performed

## 2014-11-07 NOTE — Patient Instructions (Signed)

## 2014-11-08 LAB — MICROALBUMIN / CREATININE URINE RATIO
Creatinine, Urine: 107.9 mg/dL
Microalb Creat Ratio: 1.9 mg/g (ref 0.0–30.0)
Microalb, Ur: 0.2 mg/dL (ref <2.0)

## 2014-11-08 LAB — URINALYSIS, ROUTINE W REFLEX MICROSCOPIC
Bilirubin Urine: NEGATIVE
Glucose, UA: NEGATIVE
HGB URINE DIPSTICK: NEGATIVE
Ketones, ur: NEGATIVE
Leukocytes, UA: NEGATIVE
NITRITE: NEGATIVE
PROTEIN: NEGATIVE
Specific Gravity, Urine: 1.011 (ref 1.001–1.035)
pH: 5.5 (ref 5.0–8.0)

## 2014-11-08 LAB — INSULIN, RANDOM: Insulin: 3.8 u[IU]/mL (ref 2.0–19.6)

## 2014-11-08 LAB — PSA: PSA: 1 ng/mL (ref <=4.00)

## 2014-11-08 LAB — TESTOSTERONE: Testosterone: 234 ng/dL — ABNORMAL LOW (ref 300–890)

## 2014-11-08 LAB — VITAMIN D 25 HYDROXY (VIT D DEFICIENCY, FRACTURES): VIT D 25 HYDROXY: 37 ng/mL (ref 30–100)

## 2014-11-11 LAB — TB SKIN TEST
INDURATION: 0 mm
TB SKIN TEST: NEGATIVE

## 2014-12-18 ENCOUNTER — Other Ambulatory Visit: Payer: Self-pay | Admitting: Internal Medicine

## 2014-12-18 DIAGNOSIS — R197 Diarrhea, unspecified: Secondary | ICD-10-CM

## 2014-12-18 MED ORDER — DIPHENOXYLATE-ATROPINE 2.5-0.025 MG PO TABS: 1.0000 | ORAL_TABLET | Freq: Four times a day (QID) | ORAL | Status: DC | PRN

## 2014-12-19 ENCOUNTER — Other Ambulatory Visit: Payer: Self-pay | Admitting: Internal Medicine

## 2014-12-19 DIAGNOSIS — F411 Generalized anxiety disorder: Secondary | ICD-10-CM

## 2015-04-08 ENCOUNTER — Ambulatory Visit (INDEPENDENT_AMBULATORY_CARE_PROVIDER_SITE_OTHER): Payer: BC Managed Care – PPO | Admitting: Physician Assistant

## 2015-04-08 ENCOUNTER — Encounter: Payer: Self-pay | Admitting: Physician Assistant

## 2015-04-08 VITALS — BP 130/74 | HR 90 | Temp 97.5°F | Resp 16 | Ht 67.5 in | Wt 166.8 lb

## 2015-04-08 DIAGNOSIS — K219 Gastro-esophageal reflux disease without esophagitis: Secondary | ICD-10-CM | POA: Diagnosis not present

## 2015-04-08 DIAGNOSIS — F411 Generalized anxiety disorder: Secondary | ICD-10-CM

## 2015-04-08 DIAGNOSIS — F4321 Adjustment disorder with depressed mood: Secondary | ICD-10-CM | POA: Diagnosis not present

## 2015-04-08 DIAGNOSIS — Z634 Disappearance and death of family member: Secondary | ICD-10-CM

## 2015-04-08 DIAGNOSIS — R030 Elevated blood-pressure reading, without diagnosis of hypertension: Secondary | ICD-10-CM | POA: Diagnosis not present

## 2015-04-08 MED ORDER — ALPRAZOLAM ER 0.5 MG PO TB24: 0.5000 mg | ORAL_TABLET | Freq: Every day | ORAL | Status: DC

## 2015-04-08 MED ORDER — ONDANSETRON HCL 4 MG PO TABS: 4.0000 mg | ORAL_TABLET | Freq: Every day | ORAL | Status: DC | PRN

## 2015-04-08 MED ORDER — ATENOLOL 50 MG PO TABS: 50.0000 mg | ORAL_TABLET | Freq: Every day | ORAL | Status: DC

## 2015-04-08 MED ORDER — SUCRALFATE 1 G PO TABS: 1.0000 g | ORAL_TABLET | Freq: Three times a day (TID) | ORAL | Status: DC | PRN

## 2015-04-08 NOTE — Patient Instructions (Signed)
Start on the xanax 0.86m extended release, can take in the morning or closer to lunch/dinner if you want help sleeping Can try two at one time if you want.   If you continue to have fast heart rates with trouble sleeping please try 1/2 of the atenolol at night, can go up to a whole pill  Please take the zofran for nausea as needed Please try the carafate before any large meals to coat your stomach.   Please follow up hospice  [facility] Address: [address] [place] [address] Phone: [phone]  Counseling services  Dr. EArbutus Leas Ph.D. [address], [place]: [phone] [phone] [place]  [facility] Hours: Monday-Thursday 830-8pm  Friday 830AM-7PM Address: [phone]  Address: [address] [place] [id number] Cognitive Behavior Therapy [phone] [email or web address] [address] [place] [address] LRema Fendt therapist  EToy Cookey MA, clinical psychologist  Cognitive-Behavior Therapy; Mood Disorders; Anxiety Disorders; adult and child ADHD; Family Therapy; Stress Management; personal growth, and Marital Therapy.    DTerrance MassPh.D., clinical psychologist Cognitive-Behavior Therapy; Mood Disorders; Anxiety Disorders; Stress     Management  [facility] [address] [place] [phone]  The [place] psychotherapist [address]re[address] [place] [phone]  EKarin GoldenPh.D., clinical psychologist [phone] [address] [place] [id number] Behavior Therapy, Depression, Bipolar, Anxiety, Grief and Loss

## 2015-04-08 NOTE — Progress Notes (Signed)
Subjective:    Patient ID: Dillon [name], male    DOB: 02-01-1956, 60 y.o.   MRN: [id number]  HPI 60 y.o. WM with history of UC, preDM, HTN presents with depression. Son died in his home 20-Mar-2022, son had history of addiction, unknown cause of death, has had prolonged morning due to delayed burial.  Has not seen counseling. Has two grand sons from deceased son that are not handling it well, 30 and 60 year old. They are wanting to sell the house due to the tragedy. Wife has been here and they are going to talk with hospice and have talked with pastor at their church. He has had his stomach in "knots", decreased appetite, has nausea, no vomiting, always with diarrhea due to UC. Has had xanax only has taken 1/2 only twice in past 2 weeks.   Wife is Press photographer. Blood pressure 130/74, pulse 90, temperature 97.5 F (36.4 C), temperature source Temporal, resp. rate 16, height 5' 7.5" (1.715 m), weight 166 lb 12.8 oz (75.66 kg), SpO2 99 %.  Past Medical History  Diagnosis Date  . Hypertension   . DJD (degenerative joint disease)   . Hyperlipidemia   . Elevated hemoglobin A1c    Current Outpatient Prescriptions on File Prior to Visit  Medication Sig Dispense Refill  . ALPRAZolam (XANAX) 0.5 MG tablet TAKE 1/2 TO 1 TABLET THREE TIMES DAILY AS NEEDED FOR ANXIETY 90 tablet 5  . B Complex-C (B-COMPLEX WITH VITAMIN C) tablet Take 1 tablet by mouth daily.    . diphenoxylate-atropine (LOMOTIL) 2.5-0.025 MG tablet Take 1 tablet by mouth 4 (four) times daily as needed for diarrhea or loose stools. 120 tablet 3  . Ibuprofen (ADVIL) 200 MG CAPS Take by mouth as needed.    . Multiple Vitamins-Minerals (MULTIVITAMIN WITH MINERALS) tablet Take 1 tablet by mouth daily.     No current facility-administered medications on file prior to visit.    Review of Systems  Constitutional: Positive for activity change, appetite change and fatigue. Negative for fever, chills, diaphoresis and unexpected weight change.   HENT: Negative.   Respiratory: Negative.  Negative for shortness of breath.   Cardiovascular: Negative.  Negative for chest pain and leg swelling.  Gastrointestinal: Positive for nausea and diarrhea. Negative for vomiting, abdominal pain, constipation, blood in stool, abdominal distention, anal bleeding and rectal pain.  Genitourinary: Negative.   Musculoskeletal: Negative.   Neurological: Negative.   Psychiatric/Behavioral: Positive for sleep disturbance, dysphoric mood and decreased concentration. Negative for suicidal ideas, hallucinations, behavioral problems, confusion, self-injury and agitation. The patient is nervous/anxious. The patient is not hyperactive.        Objective:   Physical Exam  Constitutional: He appears well-developed and well-nourished. No distress.  HENT:  Head: Normocephalic and atraumatic.  Eyes: Conjunctivae are normal. Pupils are equal, round, and reactive to light.  Neck: Normal range of motion. Neck supple.  Cardiovascular: Normal rate and regular rhythm.   Pulmonary/Chest: Effort normal and breath sounds normal.  Abdominal: Soft. He exhibits no distension and no mass. Bowel sounds are increased. There is tenderness (LLQ). There is no rigidity, no rebound and no guarding.  Large vertical abdominal scar  Lymphadenopathy:    He has no cervical adenopathy.  Skin: He is not diaphoretic.  Psychiatric: His mood appears anxious. His affect is not angry, not blunt, not labile and not inappropriate (appropriate). His speech is not rapid and/or pressured. He does not express inappropriate judgment. He exhibits a depressed mood. He expresses  no homicidal and no suicidal ideation. He expresses no suicidal plans and no homicidal plans.      Assessment & Plan:  1. . Grief at loss of child and Generalized anxiety disorder Xanax 0.5 extended release Get counseling Close follow up 1 month  2 Gastroesophageal reflux disease without esophagitis Nexium, zofran,  carafate Benign AB If any worsening pain, vomiting, go to ER

## 2015-05-15 ENCOUNTER — Ambulatory Visit: Payer: Self-pay | Admitting: Physician Assistant

## 2015-05-20 ENCOUNTER — Ambulatory Visit (INDEPENDENT_AMBULATORY_CARE_PROVIDER_SITE_OTHER): Payer: BC Managed Care – PPO | Admitting: Physician Assistant

## 2015-05-20 ENCOUNTER — Encounter: Payer: Self-pay | Admitting: Physician Assistant

## 2015-05-20 VITALS — BP 118/70 | HR 77 | Temp 97.5°F | Resp 16 | Ht 67.5 in | Wt 168.2 lb

## 2015-05-20 DIAGNOSIS — F411 Generalized anxiety disorder: Secondary | ICD-10-CM | POA: Diagnosis not present

## 2015-05-20 DIAGNOSIS — F4310 Post-traumatic stress disorder, unspecified: Secondary | ICD-10-CM

## 2015-05-20 DIAGNOSIS — K219 Gastro-esophageal reflux disease without esophagitis: Secondary | ICD-10-CM | POA: Diagnosis not present

## 2015-05-20 DIAGNOSIS — F4321 Adjustment disorder with depressed mood: Secondary | ICD-10-CM

## 2015-05-20 DIAGNOSIS — Z634 Disappearance and death of family member: Principal | ICD-10-CM

## 2015-05-20 MED ORDER — SERTRALINE HCL 50 MG PO TABS: 50.0000 mg | ORAL_TABLET | Freq: Every day | ORAL | Status: DC

## 2015-05-20 NOTE — Patient Instructions (Signed)
Counseling services  Dr. Arbutus Leas, Ph.D. [address] [place] [address] Phone: [phone], [place] [phone] [place] [address], [place] [address]   [facility] Hours: Monday-Thursday 830-8pm  Friday 830AM-7PM Address: [place] Phone:([phone]) [place].  Address: [place], [place] [address] [facility] [phone] office [email or web address] [address], [place], [place], [place] [address]  Rema Fendt, therapist  Toy Cookey, MA, clinical psychologist  Cognitive-Behavior Therapy; Mood Disorders; Anxiety Disorders; adult and child ADHD; Family Therapy; Stress Management; personal growth, and Marital Therapy.    Terrance Mass Ph.D., clinical psychologist Cognitive-Behavior Therapy; Mood Disorders; Anxiety Disorders; Stress     Management  [facility] [address] [place], [place] [address] [phone]   [facility], psychotherapist [address] [place], [place] [address] [phone]  Karin Golden Ph.D., clinical psychologist [phone] office [place], [place] [address] Cognitive Behavior Therapy, Depression, Bipolar, Anxiety, Grief and Loss

## 2015-05-20 NOTE — Progress Notes (Signed)
Subjective:    Patient ID: Dillon [name], male    DOB: 1955/12/16, 60 y.o.   MRN: [id number]  HPI 60 y.o. WM with history of UC, preDM, HTN presents with depression. Son died in his home 03/17/2022, son had history of addiction, unknown cause of death, has had prolonged mourning due to delayed burial, now has ashes and will bury half and keep half because he does not want to part with them.   Has not seen counseling but has been with his grand kids. Has two grand sons from deceased son that are not handling it well, 64 and 60 year old. They are wanting to sell the house due to the tragedy but unable to afford moving at this time.  He has had his stomach in "knots", decreased appetite, has nausea, no vomiting, always with diarrhea due to UC, has not seen UC in a long time. Has been having flash backs of finding his son dead.  Oldest son now also in treatment for heroin in [place].   Wife is Press photographer. Blood pressure 118/70, pulse 77, temperature 97.5 F (36.4 C), temperature source Temporal, resp. rate 16, height 5' 7.5" (1.715 m), weight 168 lb 3.2 oz (76.295 kg), SpO2 97 %.  Past Medical History  Diagnosis Date  . Hypertension   . DJD (degenerative joint disease)   . Hyperlipidemia   . Elevated hemoglobin A1c    Current Outpatient Prescriptions on File Prior to Visit  Medication Sig Dispense Refill  . ALPRAZolam (XANAX XR) 0.5 MG 24 hr tablet Take 1 tablet (0.5 mg total) by mouth daily. 30 tablet 0  . atenolol (TENORMIN) 50 MG tablet Take 1 tablet (50 mg total) by mouth daily. 30 tablet 11  . B Complex-C (B-COMPLEX WITH VITAMIN C) tablet Take 1 tablet by mouth daily.    . diphenoxylate-atropine (LOMOTIL) 2.5-0.025 MG tablet Take 1 tablet by mouth 4 (four) times daily as needed for diarrhea or loose stools. 120 tablet 3  . Ibuprofen (ADVIL) 200 MG CAPS Take by mouth as needed.    . Multiple Vitamins-Minerals (MULTIVITAMIN WITH MINERALS) tablet Take 1 tablet by mouth daily.    . ondansetron  (ZOFRAN) 4 MG tablet Take 1 tablet (4 mg total) by mouth daily as needed for nausea or vomiting. 30 tablet 1  . sucralfate (CARAFATE) 1 g tablet Take 1 tablet (1 g total) by mouth 3 (three) times daily with meals as needed. 30 tablet 1   No current facility-administered medications on file prior to visit.    Review of Systems  Constitutional: Positive for activity change, appetite change and fatigue. Negative for fever, chills, diaphoresis and unexpected weight change.  HENT: Negative.   Respiratory: Negative.  Negative for shortness of breath.   Cardiovascular: Negative.  Negative for chest pain and leg swelling.  Gastrointestinal: Positive for nausea and diarrhea. Negative for vomiting, abdominal pain, constipation, blood in stool, abdominal distention, anal bleeding and rectal pain.  Genitourinary: Negative.   Musculoskeletal: Negative.   Neurological: Negative.   Psychiatric/Behavioral: Positive for sleep disturbance, dysphoric mood and decreased concentration. Negative for suicidal ideas, hallucinations, behavioral problems, confusion, self-injury and agitation. The patient is nervous/anxious. The patient is not hyperactive.        Objective:   Physical Exam  Constitutional: He appears well-developed and well-nourished. No distress.  HENT:  Head: Normocephalic and atraumatic.  Eyes: Conjunctivae are normal. Pupils are equal, round, and reactive to light.  Neck: Normal range of motion. Neck supple.  Cardiovascular: Normal rate and regular rhythm.   Pulmonary/Chest: Effort normal and breath sounds normal.  Abdominal: Soft. He exhibits no distension and no mass. Bowel sounds are increased. There is tenderness (LLQ). There is no rigidity, no rebound and no guarding.  Large vertical abdominal scar  Lymphadenopathy:    He has no cervical adenopathy.  Skin: He is not diaphoretic.  Psychiatric: His mood appears anxious. His affect is not angry, not blunt, not labile and not  inappropriate (appropriate). His speech is not rapid and/or pressured. He does not express inappropriate judgment. He exhibits a depressed mood. He expresses no homicidal and no suicidal ideation. He expresses no suicidal plans and no homicidal plans.      Assessment & Plan:  1. . Grief at loss of child and Generalized anxiety disorder Xanax 0.5 PRN, start zoloft for possible PTSD as well Get counseling still suggest hospice/counseling Close follow up 1 month  2 Gastroesophageal reflux disease without esophagitis Nexium, zofran, carafate- improved Benign AB Follow up GI

## 2015-12-01 ENCOUNTER — Encounter: Payer: Self-pay | Admitting: Internal Medicine

## 2015-12-14 ENCOUNTER — Other Ambulatory Visit: Payer: Self-pay | Admitting: Internal Medicine

## 2015-12-14 DIAGNOSIS — R197 Diarrhea, unspecified: Secondary | ICD-10-CM

## 2015-12-15 ENCOUNTER — Telehealth: Payer: Self-pay

## 2015-12-15 NOTE — Telephone Encounter (Signed)
Pt's (LOMOTIL) 2.5-0.025 MG tablet was called into pharmacy.

## 2016-02-16 ENCOUNTER — Encounter: Payer: Self-pay | Admitting: Internal Medicine

## 2016-02-16 ENCOUNTER — Ambulatory Visit (INDEPENDENT_AMBULATORY_CARE_PROVIDER_SITE_OTHER): Payer: BC Managed Care – PPO | Admitting: Internal Medicine

## 2016-02-16 VITALS — BP 136/82 | HR 80 | Temp 97.5°F | Resp 16 | Ht 67.75 in | Wt 166.8 lb

## 2016-02-16 DIAGNOSIS — Z111 Encounter for screening for respiratory tuberculosis: Secondary | ICD-10-CM

## 2016-02-16 DIAGNOSIS — E782 Mixed hyperlipidemia: Secondary | ICD-10-CM

## 2016-02-16 DIAGNOSIS — Z79899 Other long term (current) drug therapy: Secondary | ICD-10-CM | POA: Diagnosis not present

## 2016-02-16 DIAGNOSIS — R7303 Prediabetes: Secondary | ICD-10-CM | POA: Diagnosis not present

## 2016-02-16 DIAGNOSIS — E559 Vitamin D deficiency, unspecified: Secondary | ICD-10-CM

## 2016-02-16 DIAGNOSIS — Z125 Encounter for screening for malignant neoplasm of prostate: Secondary | ICD-10-CM | POA: Diagnosis not present

## 2016-02-16 DIAGNOSIS — Z23 Encounter for immunization: Secondary | ICD-10-CM

## 2016-02-16 DIAGNOSIS — R6889 Other general symptoms and signs: Secondary | ICD-10-CM | POA: Diagnosis not present

## 2016-02-16 DIAGNOSIS — R030 Elevated blood-pressure reading, without diagnosis of hypertension: Secondary | ICD-10-CM | POA: Diagnosis not present

## 2016-02-16 DIAGNOSIS — Z136 Encounter for screening for cardiovascular disorders: Secondary | ICD-10-CM | POA: Diagnosis not present

## 2016-02-16 DIAGNOSIS — K219 Gastro-esophageal reflux disease without esophagitis: Secondary | ICD-10-CM

## 2016-02-16 DIAGNOSIS — Z634 Disappearance and death of family member: Secondary | ICD-10-CM

## 2016-02-16 DIAGNOSIS — F4321 Adjustment disorder with depressed mood: Secondary | ICD-10-CM

## 2016-02-16 DIAGNOSIS — Z0001 Encounter for general adult medical examination with abnormal findings: Secondary | ICD-10-CM | POA: Diagnosis not present

## 2016-02-16 DIAGNOSIS — R5383 Other fatigue: Secondary | ICD-10-CM

## 2016-02-16 DIAGNOSIS — Z1212 Encounter for screening for malignant neoplasm of rectum: Secondary | ICD-10-CM

## 2016-02-16 LAB — CBC WITH DIFFERENTIAL/PLATELET
BASOS PCT: 1 %
Basophils Absolute: 54 cells/uL (ref 0–200)
EOS PCT: 4 %
Eosinophils Absolute: 216 cells/uL (ref 15–500)
HCT: 45.9 % (ref 38.5–50.0)
Hemoglobin: 15.5 g/dL (ref 13.2–17.1)
Lymphocytes Relative: 29 %
Lymphs Abs: 1566 cells/uL (ref 850–3900)
MCH: 31.3 pg (ref 27.0–33.0)
MCHC: 33.8 g/dL (ref 32.0–36.0)
MCV: 92.5 fL (ref 80.0–100.0)
MONOS PCT: 7 %
MPV: 9.4 fL (ref 7.5–12.5)
Monocytes Absolute: 378 cells/uL (ref 200–950)
NEUTROS ABS: 3186 {cells}/uL (ref 1500–7800)
Neutrophils Relative %: 59 %
PLATELETS: 333 10*3/uL (ref 140–400)
RBC: 4.96 MIL/uL (ref 4.20–5.80)
RDW: 14.1 % (ref 11.0–15.0)
WBC: 5.4 10*3/uL (ref 3.8–10.8)

## 2016-02-16 LAB — TSH: TSH: 1.34 m[IU]/L (ref 0.40–4.50)

## 2016-02-16 LAB — HEPATIC FUNCTION PANEL
ALK PHOS: 70 U/L (ref 40–115)
ALT: 27 U/L (ref 9–46)
AST: 22 U/L (ref 10–35)
Albumin: 4.3 g/dL (ref 3.6–5.1)
BILIRUBIN DIRECT: 0.1 mg/dL (ref <=0.2)
BILIRUBIN TOTAL: 0.4 mg/dL (ref 0.2–1.2)
Indirect Bilirubin: 0.3 mg/dL (ref 0.2–1.2)
Total Protein: 7.2 g/dL (ref 6.1–8.1)

## 2016-02-16 LAB — IRON AND TIBC
%SAT: 16 % (ref 15–60)
Iron: 71 ug/dL (ref 50–180)
TIBC: 455 ug/dL — AB (ref 250–425)
UIBC: 384 ug/dL (ref 125–400)

## 2016-02-16 LAB — BASIC METABOLIC PANEL WITH GFR
BUN: 14 mg/dL (ref 7–25)
CHLORIDE: 105 mmol/L (ref 98–110)
CO2: 28 mmol/L (ref 20–31)
CREATININE: 0.84 mg/dL (ref 0.70–1.25)
Calcium: 9.6 mg/dL (ref 8.6–10.3)
GFR, Est Non African American: >89 mL/min (ref >=60)
Glucose, Bld: 91 mg/dL (ref 65–99)
POTASSIUM: 4.2 mmol/L (ref 3.5–5.3)
SODIUM: 142 mmol/L (ref 135–146)

## 2016-02-16 LAB — LIPID PANEL
CHOL/HDL RATIO: 3.6 ratio (ref <5.0)
Cholesterol: 200 mg/dL — ABNORMAL HIGH (ref <200)
HDL: 56 mg/dL (ref >40)
LDL Cholesterol: 105 mg/dL — ABNORMAL HIGH (ref <100)
TRIGLYCERIDES: 195 mg/dL — AB (ref <150)
VLDL: 39 mg/dL — ABNORMAL HIGH (ref <30)

## 2016-02-16 LAB — HEMOGLOBIN A1C
Hgb A1c MFr Bld: 5.4 % (ref <5.7)
Mean Plasma Glucose: 108 mg/dL

## 2016-02-16 LAB — VITAMIN B12: Vitamin B-12: 585 pg/mL (ref 200–1100)

## 2016-02-16 LAB — PSA: PSA: 1.2 ng/mL (ref <=4.0)

## 2016-02-16 MED ORDER — CITALOPRAM HYDROBROMIDE 20 MG PO TABS: 20.0000 mg | ORAL_TABLET | Freq: Every day | ORAL | 1 refills | Status: DC

## 2016-02-16 NOTE — Progress Notes (Signed)
[facility]   [name], M.D.    Uvaldo Bristle. [name], P.A.-C      Starlyn Skeans, P.A.-C  [facility]                [address] [place] [address] Telephone [phone] Telefax [phone] Annual  Screening/Preventative Visit  & Comprehensive Evaluation & Examination     This very nice 61 y.o. MWM presents for a Screening/Preventative Visit & comprehensive evaluation and management of multiple medical co-morbidities.  Patient has been followed for HTN, Prediabetes, Hyperlipidemia and Vitamin D Deficiency. Patient has an emotionally stressful year with one son having died from Drug OD and patiient & spouse have to raise their two grandchildren 80 yo & 61 yo. Another son is in a drug rehab program. Patient reports ongoing issues with grief as well as the stress of raising the [employer]. He stopped a trial with  Sertraline as he developed a feeling of dysphoria.      Other pertinent hx is he is s/p total colectomy in 2000 for Ulcerative colitis and he continues to have frequent BM's upo to 10-15 x/day and consistency  from liquid to partially formed. Patient also has prior hx/oGERD with sx's controlled with diet alone.     Patient has hx/o labile HTN predating circa 2013 with BP  144/84 and is followed expectantly. Patient's BP has been controlled at home.  Today's BP is borderline at  136/82. Patient denies any cardiac symptoms as chest pain, palpitations, shortness of breath, dizziness or ankle swelling.     Patient's hyperlipidemia is controlled with diet and medications. Patient denies myalgias or other medication SE's. Last lipids were near , but not at goal: Lab Results  Component Value Date   CHOL 181 11/07/2014   HDL 50 11/07/2014   LDLCALC 111 11/07/2014   TRIG 99 11/07/2014   CHOLHDL 3.6 11/07/2014      Patient has prediabetes with A1c 5.7% in 2013 and patient denies reactive  hypoglycemic symptoms, visual blurring, diabetic polys or paresthesias. Last A1c was not at goal:  Lab Results  Component Value Date   HGBA1C 5.8 (H) 11/07/2014       Finally, patient has history of Vitamin D Deficiency & does not supplement Vit D as recommended. Last vitamin D was still very low : Lab Results  Component Value Date   VD25OH 37 11/07/2014   Current Outpatient Prescriptions on File Prior to Visit  Medication Sig  . ALPRAZolam (XANAX XR) 0.5 MG 24 hr tablet Take 1 tablet (0.5 mg total) by mouth daily.  . B Complex-C (B-COMPLEX WITH VITAMIN C) tablet Take 1 tablet by mouth daily.  . diphenoxylate-atropine (LOMOTIL) 2.5-0.025 MG tablet TAKE 1 TABLET BY MOUTH FOUR TIMES DAILY AS NEEDED FOR DIARRHEA OR LOOSE STOOLS  . Ibuprofen (ADVIL) 200 MG CAPS Take by mouth as needed.  . Multiple Vitamins-Minerals (MULTIVITAMIN WITH MINERALS) tablet Take 1 tablet by mouth daily.   No current facility-administered medications on file prior to visit.    Allergies  Allergen Reactions  . Codeine Nausea And Vomiting  . Norco [Hydrocodone-Acetaminophen]   . Penicillins Nausea And Vomiting   Past Medical History:  Diagnosis Date  . DJD (degenerative joint disease)   . Elevated hemoglobin A1c   . Hyperlipidemia   . Hypertension    Health Maintenance  Topic Date Due  .  Hepatitis C Screening  [birth year]  . HIV Screening  11/26/1970  . COLONOSCOPY  11/25/2005  . ZOSTAVAX  11/26/2015  . TETANUS/TDAP  02/07/2023  . INFLUENZA VACCINE  Completed   Immunization History  Administered Date(s) Administered  . Influenza Split 11/02/2013, 11/07/2014  . Influenza-Unspecified 11/23/2015  . PPD Test 11/02/2013, 11/07/2014, 02/16/2016  . Pneumococcal Polysaccharide-23 02/16/2016  . Tdap 02/06/2013   Past Surgical History:  Procedure Laterality Date  . COLON SURGERY  2000, reversal 2001   coloectomy, with reversal and now a J pouch  . ELBOW LIGAMENT RECONSTRUCTION Right July 2014   Dr  Apolonio Schneiders  . ELBOW LIGAMENT RECONSTRUCTION Left Sept 2016   Dr Apolonio Schneiders  . ELBOW SURGERY Right 2014  . HAND SURGERY Right 1975  . HAND SURGERY Right 1975   Family History  Problem Relation Age of Onset  . Diabetes Mother   . Arthritis Mother     Rheumatoid  . Heart disease Father   . Gout Father   . Cancer Father     melanoma  . Cancer Sister     Breast   Social History   Social History  . Marital status: Married    Spouse name: N/A  . Number of children: N/A  . Years of education: N/A   Occupational History  .    Social History Main Topics  . Smoking status: Never Smoker  . Smokeless tobacco: Not on file  . Alcohol use 7.0 oz/week    14 drink(s) per week  . Drug use: Unknown  . Sexual activity: Not on file    ROS Constitutional: Denies fever, chills, weight loss/gain, headaches, insomnia,  night sweats or change in appetite. Does c/o fatigue. Eyes: Denies redness, blurred vision, diplopia, discharge, itchy or watery eyes.  ENT: Denies discharge, congestion, post nasal drip, epistaxis, sore throat, earache, hearing loss, dental pain, Tinnitus, Vertigo, Sinus pain or snoring.  Cardio: Denies chest pain, palpitations, irregular heartbeat, syncope, dyspnea, diaphoresis, orthopnea, PND, claudication or edema Respiratory: denies cough, dyspnea, DOE, pleurisy, hoarseness, laryngitis or wheezing.  Gastrointestinal: Denies dysphagia, heartburn, reflux, water brash, pain, cramps, nausea, vomiting, bloating, diarrhea, constipation, hematemesis, melena, hematochezia, jaundice or hemorrhoids Genitourinary: Denies dysuria, frequency, urgency, nocturia, hesitancy, discharge, hematuria or flank pain Musculoskeletal: Denies arthralgia, myalgia, stiffness, Jt. Swelling, pain, limp or strain/sprain. Denies Falls. Skin: Denies puritis, rash, hives, warts, acne, eczema or change in skin lesion Neuro: No weakness, tremor, incoordination, spasms, paresthesia or pain Psychiatric: Denies  confusion, memory loss or sensory loss. Denies Depression. Endocrine: Denies change in weight, skin, hair change, nocturia, and paresthesia, diabetic polys, visual blurring or hyper / hypo glycemic episodes.  Heme/Lymph: No excessive bleeding, bruising or enlarged lymph nodes.  Physical Exam  BP 136/82   Pulse 80   Temp 97.5 F (36.4 C)   Resp 16   Ht 5' 7.75" (1.721 m)   Wt 166 lb 12.8 oz (75.7 kg)   BMI 25.55 kg/m   General Appearance: Well nourished, in no apparent distress.  Eyes: PERRLA, EOMs, conjunctiva no swelling or erythema, normal fundi and vessels. Sinuses: No frontal/maxillary tenderness ENT/Mouth: EACs patent / TMs  nl. Nares clear without erythema, swelling, mucoid exudates. Oral hygiene is good. No erythema, swelling, or exudate. Tongue normal, non-obstructing. Tonsils not swollen or erythematous. Hearing normal.  Neck: Supple, thyroid normal. No bruits, nodes or JVD. Respiratory: Respiratory effort normal.  BS equal and clear bilateral without rales, rhonci, wheezing or stridor. Cardio: Heart sounds are normal with regular rate and rhythm  and no murmurs, rubs or gallops. Peripheral pulses are normal and equal bilaterally without edema. No aortic or femoral bruits. Chest: symmetric with normal excursions and percussion.  Abdomen: Soft, with Nl bowel sounds. Nontender, no guarding, rebound, hernias, masses, or organomegaly.  Lymphatics: Non tender without lymphadenopathy.  Genitourinary:  Deferred by patient request. Musculoskeletal: Full ROM all peripheral extremities, joint stability, 5/5 strength, and normal gait. Skin: Warm and dry without rashes, lesions, cyanosis, clubbing or  ecchymosis.  Neuro: Cranial nerves intact, reflexes equal bilaterally. Normal muscle tone, no cerebellar symptoms. Sensation intact.  Pysch: Alert and oriented X 3 with normal affect, insight and judgment appropriate.   Assessment and Plan  1. Annual Preventative/Screening Exam    2.  Elevated blood pressure reading without diagnosis of hypertension  - Microalbumin / creatinine urine ratio - EKG 12-Lead - [place], RETROPERITNL ABD,  LTD - Urinalysis, Routine w reflex microscopic - CBC with Differential/Platelet - BASIC METABOLIC PANEL WITH GFR - TSH  3. Mixed hyperlipidemia  - EKG 12-Lead - [place], RETROPERITNL ABD,  LTD - Hepatic function panel - Lipid panel - TSH  4. Prediabetes  - EKG 12-Lead - [place], RETROPERITNL ABD,  LTD - Hemoglobin A1c - Insulin, random  5. Vitamin D deficiency  - VITAMIN D 25 Hydroxy   6. Screening for malignant neoplasm of the rectum - cancelled   7. Special screening for malignant neoplasm of prostate  - PSA  8. Gastroesophageal reflux disease    9. Screening for ischemic heart disease  - EKG 12-Lead  10. Screening for AAA (aortic abdominal aneurysm)  - [place], RETROPERITNL ABD,  LTD  11. Need for prophylactic vaccination against Streptococcus pneumoniae (pneumococcus)  - Pneumococcal polysaccharide vaccine 23-valent greater than or equal to 2yo subcutaneous/IM  12. Screening examination for pulmonary tuberculosis  - PPD  13. Fatigue  - Vitamin B12 - Iron and TIBC - Testosterone - CBC with Differential/Platelet  14. Grief at loss of child  - citalopram  20 MG tab; Take 1 tab daily.  Disp: 90 tabsd; Rf: 1 =- ROV 6 weeks to reassess  15. Medication management  - Urinalysis, Routine w reflex microscopic - CBC with Differential/Platelet - BASIC METABOLIC PANEL WITH GFR - Hepatic function panel - Magnesium       Continue prudent diet as discussed, weight control, BP monitoring, regular exercise, and medications as discussed.  Discussed med effects and SE's. Routine screening labs and tests as requested with regular follow-up as recommended. Over 40 minutes of exam, counseling, chart review and high complex critical decision making was performed

## 2016-02-16 NOTE — Patient Instructions (Signed)

## 2016-02-17 LAB — URINALYSIS, ROUTINE W REFLEX MICROSCOPIC
BILIRUBIN URINE: NEGATIVE
GLUCOSE, UA: NEGATIVE
Hgb urine dipstick: NEGATIVE
Leukocytes, UA: NEGATIVE
Nitrite: NEGATIVE
Protein, ur: NEGATIVE
SPECIFIC GRAVITY, URINE: 1.026 (ref 1.001–1.035)
pH: 5.5 (ref 5.0–8.0)

## 2016-02-17 LAB — MAGNESIUM: MAGNESIUM: 2.1 mg/dL (ref 1.5–2.5)

## 2016-02-17 LAB — MICROALBUMIN / CREATININE URINE RATIO
CREATININE, URINE: 446 mg/dL — AB (ref 20–370)
MICROALB/CREAT RATIO: 3 ug/mg{creat} (ref <30)
Microalb, Ur: 1.3 mg/dL

## 2016-02-17 LAB — TESTOSTERONE: TESTOSTERONE: 272 ng/dL (ref 250–827)

## 2016-02-17 LAB — INSULIN, RANDOM: Insulin: 4.6 u[IU]/mL (ref 2.0–19.6)

## 2016-02-17 LAB — VITAMIN D 25 HYDROXY (VIT D DEFICIENCY, FRACTURES): VIT D 25 HYDROXY: 68 ng/mL (ref 30–100)

## 2016-02-20 ENCOUNTER — Encounter: Payer: Self-pay | Admitting: *Deleted

## 2016-02-23 LAB — TB SKIN TEST
Induration: 0 mm
TB Skin Test: NEGATIVE

## 2016-02-25 ENCOUNTER — Other Ambulatory Visit: Payer: Self-pay | Admitting: Internal Medicine

## 2016-02-25 ENCOUNTER — Telehealth: Payer: Self-pay | Admitting: *Deleted

## 2016-02-25 NOTE — Telephone Encounter (Signed)
Patient called and states the Citalopram is causing him to have pressure in his head and elevated heart rate.  Per Dr Melford Aase, try cutting the tablet in 1/2 to equal 10 mg and 1/2 tablet daily. Patient is aware and will call back PRN.

## 2016-03-30 ENCOUNTER — Ambulatory Visit (INDEPENDENT_AMBULATORY_CARE_PROVIDER_SITE_OTHER): Payer: BC Managed Care – PPO | Admitting: Internal Medicine

## 2016-03-30 ENCOUNTER — Encounter: Payer: Self-pay | Admitting: Internal Medicine

## 2016-03-30 VITALS — BP 124/72 | HR 76 | Temp 97.3°F | Resp 16 | Ht 67.75 in | Wt 174.2 lb

## 2016-03-30 DIAGNOSIS — R0989 Other specified symptoms and signs involving the circulatory and respiratory systems: Secondary | ICD-10-CM

## 2016-03-30 DIAGNOSIS — F419 Anxiety disorder, unspecified: Secondary | ICD-10-CM | POA: Diagnosis not present

## 2016-03-30 DIAGNOSIS — I10 Essential (primary) hypertension: Secondary | ICD-10-CM

## 2016-03-30 NOTE — Patient Instructions (Signed)
Generalized Anxiety Disorder, Adult Generalized anxiety disorder (GAD) is a mental health disorder. People with this condition constantly worry about everyday events. Unlike normal anxiety, worry related to GAD is not triggered by a specific event. These worries also do not fade or get better with time. GAD interferes with life functions, including relationships, work, and school. GAD can vary from mild to severe. People with severe GAD can have intense waves of anxiety with physical symptoms (panic attacks). What are the causes? The exact cause of GAD is not known. What increases the risk? This condition is more likely to develop in:  Women.  People who have a family history of anxiety disorders.  People who are very shy.  People who experience very stressful life events, such as the death of a loved one.  People who have a very stressful family environment. What are the signs or symptoms? People with GAD often worry excessively about many things in their lives, such as their health and family. They may also be overly concerned about:  Doing well at work.  Being on time.  Natural disasters.  Friendships. Physical symptoms of GAD include:  Fatigue.  Muscle tension or having muscle twitches.  Trembling or feeling shaky.  Being easily startled.  Feeling like your heart is pounding or racing.  Feeling out of breath or like you cannot take a deep breath.  Having trouble falling asleep or staying asleep.  Sweating.  Nausea, diarrhea, or irritable bowel syndrome (IBS).  Headaches.  Trouble concentrating or remembering facts.  Restlessness.  Irritability. How is this diagnosed? Your health care provider can diagnose GAD based on your symptoms and medical history. You will also have a physical exam. The health care provider will ask specific questions about your symptoms, including how severe they are, when they started, and if they come and go. Your health care  provider may ask you about your use of alcohol or drugs, including prescription medicines. Your health care provider may refer you to a mental health specialist for further evaluation. Your health care provider will do a thorough examination and may perform additional tests to rule out other possible causes of your symptoms. To be diagnosed with GAD, a person must have anxiety that:  Is out of his or her control.  Affects several different aspects of his or her life, such as work and relationships.  Causes distress that makes him or her unable to take part in normal activities.  Includes at least three physical symptoms of GAD, such as restlessness, fatigue, trouble concentrating, irritability, muscle tension, or sleep problems. Before your health care provider can confirm a diagnosis of GAD, these symptoms must be present more days than they are not, and they must last for six months or longer. How is this treated? The following therapies are usually used to treat GAD:  Medicine. Antidepressant medicine is usually prescribed for long-term daily control. Antianxiety medicines may be added in severe cases, especially when panic attacks occur.  Talk therapy (psychotherapy). Certain types of talk therapy can be helpful in treating GAD by providing support, education, and guidance. Options include:  Cognitive behavioral therapy (CBT). People learn coping skills and techniques to ease their anxiety. They learn to identify unrealistic or negative thoughts and behaviors and to replace them with positive ones.  Acceptance and commitment therapy (ACT). This treatment teaches people how to be mindful as a way to cope with unwanted thoughts and feelings.  Biofeedback. This process trains you to manage your body's response (  physiological response) through breathing techniques and relaxation methods. You will work with a therapist while machines are used to monitor your physical symptoms.  Stress  management techniques. These include yoga, meditation, and exercise. A mental health specialist can help determine which treatment is best for you. Some people see improvement with one type of therapy. However, other people require a combination of therapies. Follow these instructions at home:  Take over-the-counter and prescription medicines only as told by your health care provider.  Try to maintain a normal routine.  Try to anticipate stressful situations and allow extra time to manage them.  Practice any stress management or self-calming techniques as taught by your health care provider.  Do not punish yourself for setbacks or for not making progress.  Try to recognize your accomplishments, even if they are small.  Keep all follow-up visits as told by your health care provider. This is important. Contact a health care provider if:  Your symptoms do not get better.  Your symptoms get worse.  You have signs of depression, such as:  A persistently sad, cranky, or irritable mood.  Loss of enjoyment in activities that used to bring you joy.  Change in weight or eating.  Changes in sleeping habits.  Avoiding friends or family members.  Loss of energy for normal tasks.  Feelings of guilt or worthlessness. Get help right away if:  You have serious thoughts about hurting yourself or others. If you ever feel like you may hurt yourself or others, or have thoughts about taking your own life, get help right away. You can go to your nearest emergency department or call:  Your local emergency services (911 in the [place]  A suicide crisis helpline, such as the [facility] at 574-412-7239. This is open 24 hours a day. Summary  Generalized anxiety disorder (GAD) is a mental health disorder that involves worry that is not triggered by a specific event.  People with GAD often worry excessively about many things in their lives, such as their health and  family.  GAD may cause physical symptoms such as restlessness, trouble concentrating, sleep problems, frequent sweating, nausea, diarrhea, headaches, and trembling or muscle twitching.  A mental health specialist can help determine which treatment is best for you. Some people see improvement with one type of therapy. However, other people require a combination of therapies.  ++++++++++++++++++++++++++++++++++  Panic Attacks Panic attacks are sudden, short-livedsurges of severe anxiety, fear, or discomfort. They may occur for no reason when you are relaxed, when you are anxious, or when you are sleeping. Panic attacks may occur for a number of reasons:  Healthy people occasionally have panic attacks in extreme, life-threatening situations, such as war or natural disasters. Normal anxiety is a protective mechanism of the body that helps [place] react to danger (fight or flight response).  Panic attacks are often seen with anxiety disorders, such as panic disorder, social anxiety disorder, generalized anxiety disorder, and phobias. Anxiety disorders cause excessive or uncontrollable anxiety. They may interfere with your relationships or other life activities.  Panic attacks are sometimes seen with other mental illnesses, such as depression and posttraumatic stress disorder.  Certain medical conditions, prescription medicines, and drugs of abuse can cause panic attacks. What are the signs or symptoms? Panic attacks start suddenly, peak within 20 minutes, and are accompanied by four or more of the following symptoms:  Pounding heart or fast heart rate (palpitations).  Sweating.  Trembling or shaking.  Shortness of breath or feeling  smothered.  Feeling choked.  Chest pain or discomfort.  Nausea or strange feeling in your stomach.  Dizziness, light-headedness, or feeling like you will faint.  Chills or hot flushes.  Numbness or tingling in your lips or hands and feet.  Feeling that  things are not real or feeling that you are not yourself.  Fear of losing control or going crazy.  Fear of dying. Some of these symptoms can mimic serious medical conditions. For example, you may think you are having a heart attack. Although panic attacks can be very scary, they are not life threatening. How is this diagnosed? Panic attacks are diagnosed through an assessment by your health care provider. Your health care provider will ask questions about your symptoms, such as where and when they occurred. Your health care provider will also ask about your medical history and use of alcohol and drugs, including prescription medicines. Your health care provider may order blood tests or other studies to rule out a serious medical condition. Your health care provider may refer you to a mental health professional for further evaluation. How is this treated?  Most healthy people who have one or two panic attacks in an extreme, life-threatening situation will not require treatment.  The treatment for panic attacks associated with anxiety disorders or other mental illness typically involves counseling with a mental health professional, medicine, or a combination of both. Your health care provider will help determine what treatment is best for you.  Panic attacks due to physical illness usually go away with treatment of the illness. If prescription medicine is causing panic attacks, talk with your health care provider about stopping the medicine, decreasing the dose, or substituting another medicine.  Panic attacks due to alcohol or drug abuse go away with abstinence. Some adults need professional help in order to stop drinking or using drugs. Follow these instructions at home:  Take all medicines as directed by your health care provider.  Schedule and attend follow-up visits as directed by your health care provider. It is important to keep all your appointments. Contact a health care provider  if:  You are not able to take your medicines as prescribed.  Your symptoms do not improve or get worse. Get help right away if:  You experience panic attack symptoms that are different than your usual symptoms.  You have serious thoughts about hurting yourself or others.  You are taking medicine for panic attacks and have a serious side effect.

## 2016-03-30 NOTE — Progress Notes (Signed)
  Subjective:    Patient ID: Dillon [name], male    DOB: 10-15-1955, 61 y.o.   MRN: [id number]  HPI   This very nice 61 yo MWM returns for f/u after starting low dose Citalopram and was intolerant as he was also in the past to Sertraline. He & wife are stress with the untimely loss of a some to drug OD and are raising their 3 and 12 yo grandchildren. He reports frequent anxiety attacks and less frequent panic attacks and in the past has tolerated low dose Alprazolam.   Medication Sig  . ALPRAZolam (XANAX XR) 0.5 MG 24 hr tablet Take 1 tablet (0.5 mg total) by mouth daily.  . B Complex-C  Take 1 tablet by mouth daily.  . LOMOTIL 2.5-0.025 MG TAKE 1 TAB 4 x/ DAILY AS NEEDED FOR DIARRHEA   . Ibuprofen  200 MG Take as needed.  . Multiple Vitamins-Minerals Take 1 tablet daily.  . citalopram ) 20 MG tablet Patient not taking: Reported on 03/30/2016   Allergies  Allergen Reactions  . Codeine Nausea And Vomiting  . Norco [Hydrocodone-Acetaminophen]   . Penicillins Nausea And Vomiting   Past Medical History:  Diagnosis Date  . DJD (degenerative joint disease)   . Elevated hemoglobin A1c   . Hyperlipidemia   . Hypertension    Review of Systems  10 point systems review negative except as above.    Objective:   Physical Exam  BP 124/72   Pulse 76   Temp 97.3 F (36.3 C)   Resp 16   Ht 5' 7.75" (1.721 m)   Wt 174 lb 3.2 oz (79 kg)   BMI 26.68 kg/m   HEENT - WNL. Neck - supple.  Chest - Clear. Cor - Nl HS. RRR w/o sig MGR. PP 1(+). No edema. MS- FROM w/o deformities.  Gait Nl. Neuro - No obvious Cr N abnormalities. Nl w/o focal abnormalities. Psyche - Mental status normal & appropriate.  No delusions, ideations or obvious mood abnormalities.    Assessment & Plan:   1. Labile hypertension   2. Anxiety tension state  - advised to take his Alprazolam 0.5 mg XR qam and if needed may take a 2sd tab about 5-6 pm.   - ROV 2 2-3 weeks to re-evaluate.

## 2016-04-13 ENCOUNTER — Other Ambulatory Visit: Payer: Self-pay | Admitting: Internal Medicine

## 2016-04-20 ENCOUNTER — Ambulatory Visit: Payer: Self-pay | Admitting: Internal Medicine

## 2016-10-18 ENCOUNTER — Encounter: Payer: Self-pay | Admitting: Adult Health

## 2016-10-18 ENCOUNTER — Ambulatory Visit (INDEPENDENT_AMBULATORY_CARE_PROVIDER_SITE_OTHER): Payer: BC Managed Care – PPO | Admitting: Adult Health

## 2016-10-18 VITALS — BP 140/78 | HR 76 | Temp 97.3°F | Resp 18 | Ht 67.75 in | Wt 172.2 lb

## 2016-10-18 DIAGNOSIS — R079 Chest pain, unspecified: Secondary | ICD-10-CM

## 2016-10-18 DIAGNOSIS — F419 Anxiety disorder, unspecified: Secondary | ICD-10-CM | POA: Diagnosis not present

## 2016-10-18 DIAGNOSIS — Z79899 Other long term (current) drug therapy: Secondary | ICD-10-CM | POA: Diagnosis not present

## 2016-10-18 DIAGNOSIS — I10 Essential (primary) hypertension: Secondary | ICD-10-CM

## 2016-10-18 DIAGNOSIS — S29011A Strain of muscle and tendon of front wall of thorax, initial encounter: Secondary | ICD-10-CM | POA: Diagnosis not present

## 2016-10-18 MED ORDER — CYCLOBENZAPRINE HCL 5 MG PO TABS: 5.0000 mg | ORAL_TABLET | Freq: Three times a day (TID) | ORAL | Status: DC | PRN

## 2016-10-18 MED ORDER — CYCLOBENZAPRINE HCL 5 MG PO TABS: 5.0000 mg | ORAL_TABLET | Freq: Three times a day (TID) | ORAL | 0 refills | Status: DC | PRN

## 2016-10-18 MED ORDER — BUSPIRONE HCL 7.5 MG PO TABS: 7.5000 mg | ORAL_TABLET | Freq: Two times a day (BID) | ORAL | 0 refills | Status: DC

## 2016-10-18 MED ORDER — BUSPIRONE HCL 5 MG PO TABS: 7.5000 mg | ORAL_TABLET | Freq: Two times a day (BID) | ORAL | Status: DC

## 2016-10-18 MED ORDER — MELOXICAM 7.5 MG PO TABS: 7.5000 mg | ORAL_TABLET | Freq: Every day | ORAL | 0 refills | Status: DC

## 2016-10-18 NOTE — Patient Instructions (Signed)
Buspirone tablets What is this medicine? BUSPIRONE (byoo SPYE rone) is used to treat anxiety disorders. This medicine may be used for other purposes; ask your health care provider or pharmacist if you have questions. COMMON BRAND NAME(S): BuSpar What should I tell my health care provider before I take this medicine? They need to know if you have any of these conditions: -kidney or liver disease -an unusual or allergic reaction to buspirone, other medicines, foods, dyes, or preservatives -pregnant or trying to get pregnant -breast-feeding How should I use this medicine? Take this medicine by mouth with a glass of water. Follow the directions on the prescription label. You may take this medicine with or without food. To ensure that this medicine always works the same way for you, you should take it either always with or always without food. Take your doses at regular intervals. Do not take your medicine more often than directed. Do not stop taking except on the advice of your doctor or health care professional. Talk to your pediatrician regarding the use of this medicine in children. Special care may be needed. Overdosage: If you think you have taken too much of this medicine contact a poison control center or emergency room at once. NOTE: This medicine is only for you. Do not share this medicine with others. What if I miss a dose? If you miss a dose, take it as soon as you can. If it is almost time for your next dose, take only that dose. Do not take double or extra doses. What may interact with this medicine? Do not take this medicine with any of the following medications: -linezolid -MAOIs like Carbex, Eldepryl, Marplan, Nardil, and Parnate -methylene blue -procarbazine This medicine may also interact with the following medications: -diazepam -digoxin -diltiazem -erythromycin -grapefruit juice -haloperidol -medicines for mental depression or mood problems -medicines for seizures like  carbamazepine, phenobarbital and phenytoin -nefazodone -other medications for anxiety -rifampin -ritonavir -some antifungal medicines like itraconazole, ketoconazole, and voriconazole -verapamil -warfarin This list may not describe all possible interactions. Give your health care provider a list of all the medicines, herbs, non-prescription drugs, or dietary supplements you use. Also tell them if you smoke, drink alcohol, or use illegal drugs. Some items may interact with your medicine. What should I watch for while using this medicine? Visit your doctor or health care professional for regular checks on your progress. It may take 1 to 2 weeks before your anxiety gets better. You may get drowsy or dizzy. Do not drive, use machinery, or do anything that needs mental alertness until you know how this drug affects you. Do not stand or sit up quickly, especially if you are an older patient. This reduces the risk of dizzy or fainting spells. Alcohol can make you more drowsy and dizzy. Avoid alcoholic drinks. What side effects may I notice from receiving this medicine? Side effects that you should report to your doctor or health care professional as soon as possible: -blurred vision or other vision changes -chest pain -confusion -difficulty breathing -feelings of hostility or anger -muscle aches and pains -numbness or tingling in hands or feet -ringing in the ears -skin rash and itching -vomiting -weakness Side effects that usually do not require medical attention (report to your doctor or health care professional if they continue or are bothersome): -disturbed dreams, nightmares -headache -nausea -restlessness or nervousness -sore throat and nasal congestion -stomach upset This list may not describe all possible side effects. Call your doctor for medical advice about side  effects. You may report side effects to FDA at 1-800-FDA-1088. Where should I keep my medicine? Keep out of the reach  of children. Store at room temperature below 30 degrees C (86 degrees F). Protect from light. Keep container tightly closed. Throw away any unused medicine after the expiration date. NOTE: This sheet is a summary. It may not cover all possible information. If you have questions about this medicine, talk to your doctor, pharmacist, or health care provider.  2018 Elsevier/Gold Standard (2009-08-28 18:06:11)   Chest Wall Pain Chest wall pain is pain in or around the bones and muscles of your chest. Sometimes, an injury causes this pain. Sometimes, the cause may not be known. This pain may take several weeks or longer to get better. Follow these instructions at home: Pay attention to any changes in your symptoms. Take these actions to help with your pain:  Rest as told by your health care provider.  Avoid activities that cause pain. These include any activities that use your chest muscles or your abdominal and side muscles to lift heavy items.  If directed, apply ice to the painful area: ? Put ice in a plastic bag. ? Place a towel between your skin and the bag. ? Leave the ice on for 20 minutes, 2-3 times per day.  Take over-the-counter and prescription medicines only as told by your health care provider.  Do not use tobacco products, including cigarettes, chewing tobacco, and e-cigarettes. If you need help quitting, ask your health care provider.  Keep all follow-up visits as told by your health care provider. This is important.  Contact a health care provider if:  You have a fever.  Your chest pain becomes worse.  You have new symptoms. Get help right away if:  You have nausea or vomiting.  You feel sweaty or light-headed.  You have a cough with phlegm (sputum) or you cough up blood.  You develop shortness of breath. This information is not intended to replace advice given to you by your health care provider. Make sure you discuss any questions you have with your health care  provider. Document Released: 01/18/2005 Document Revised: 05/29/2015 Document Reviewed: 04/15/2014 Elsevier Interactive Patient Education  2017 [employer].

## 2016-10-18 NOTE — Progress Notes (Signed)
Assessment and Plan:    Dillon [name] was seen today for chest pain.  Diagnoses and all orders for this visit:  Chest pain, unspecified type -     EKG 12-Lead  Chest wall muscle strain, initial encounter -     cyclobenzaprine (FLEXERIL) tablet 5 mg; Take 1 tablet (5 mg total) by mouth 3 (three) times daily as needed for muscle spasms.             -     meloxicam (MOBIC) 7.5 MG tablet; Take 1 tablet (7.5 mg total) by mouth daily.  Medication management -     CBC with Differential/Platelet -     BASIC METABOLIC PANEL WITH GFR -     Magnesium -     Hepatic function panel  Hypertension, unspecified type -     140/78 today- will reevaluate at next visit with improved anxiety/pain control.  -      Magnesium  Anxiety -     busPIRone (BUSPAR) 7.5 MG tablet; Take 1 tablet (7.5 mg total) by mouth 2 (two) times daily. -     Continue xanax 0.5 mg- may take 1/2 tab or even 1/4 tab TID PRN  Follow up in 2 months for anxiety to assess efficacy of buspirone.     Further disposition pending results of labs. Discussed med's effects and SE's.   Over 30 minutes of exam, counseling, chart review, and critical decision making was performed.   Future Appointments Date Time Provider [place]  12/22/2016 2:30 PM [name], NP [facility] None  03/21/2017 9:00 AM [name], MD [facility] None    ------------------------------------------------------------------------------------------------------------------   HPI 61 y.o.male presents for left upper chest pain that began Saturday morning. He reports he helped his son move several boxes of tile Friday afternoon. Patient denies SOB, N/V, diaphoresis, jaw pain, upper extremity pain. Patient reports the pain is a 5/10 with movement, describes as a "pulling" "strain" sensation. Pain is reproducible and aggravated by positioning/ turning neck. He has taken aleve x 2 with minimal relief, has applied heat to the area with some improvement.    The patient also reports that he was started on celexa 20 mg but did not tolerate well- spoke with Dr. Melford Aase who recommended he go down on the dose, but he describes feeling "like I'm in a tunnel" while on the medication and discontinuing several months ago. He is prescribed xanax 0.5 mg which he states he tries to take as minimally as possible, currently approx 1x/week, but wishes to start on a daily control medication to reduce need for xanax. Discussed alternative SSRIs vs alternative class. He is agreeable to try buspirone.   BP 140/78 today; not currently taking medications. Anticipate this may improve with reduced pain/improved anxiety. Will follow up at next visit.   Past Medical History:  Diagnosis Date  . DJD (degenerative joint disease)   . Elevated hemoglobin A1c   . Hyperlipidemia   . Hypertension      Allergies  Allergen Reactions  . Codeine Nausea And Vomiting  . Norco [Hydrocodone-Acetaminophen]   . Penicillins Nausea And Vomiting    Current Outpatient Prescriptions on File Prior to Visit  Medication Sig  . ALPRAZolam (XANAX) 0.5 MG tablet TAKE HALF TO ONE TABLET BY MOUTH THREE TIMES DAILY AS NEEDED FOR ANXIETY  . B Complex-C (B-COMPLEX WITH VITAMIN C) tablet Take 1 tablet by mouth daily.  . Cholecalciferol (VITAMIN D PO) Take 5,000 Units by mouth daily.  . diphenoxylate-atropine (LOMOTIL) 2.5-0.025 MG  tablet TAKE 1 TABLET BY MOUTH FOUR TIMES DAILY AS NEEDED FOR DIARRHEA OR LOOSE STOOLS  . Ibuprofen (ADVIL) 200 MG CAPS Take by mouth as needed.  . Multiple Vitamins-Minerals (MULTIVITAMIN WITH MINERALS) tablet Take 1 tablet by mouth daily.   No current facility-administered medications on file prior to visit.     ROS: Review of Systems  Constitutional: Negative.  Negative for chills, diaphoresis, fever and malaise/fatigue.  HENT: Negative for congestion, hearing loss and sore throat.   Eyes: Negative for blurred vision.  Respiratory: Negative for cough, shortness  of breath and wheezing.   Cardiovascular: Negative for chest pain, palpitations, orthopnea, claudication, leg swelling and PND.  Gastrointestinal: Negative for abdominal pain, blood in stool, constipation, melena and nausea.  Genitourinary: Negative.   Musculoskeletal: Negative for back pain, myalgias and neck pain.  Skin: Negative.   Neurological: Negative for dizziness, sensory change and headaches.  Endo/Heme/Allergies: Negative.   Psychiatric/Behavioral: Negative for depression and suicidal ideas. The patient is nervous/anxious and has insomnia (Chronic).      Physical Exam:  BP 140/78   Pulse 76   Temp (!) 97.3 F (36.3 C)   Resp 18   Ht 5' 7.75" (1.721 m)   Wt 172 lb 3.2 oz (78.1 kg)   BMI 26.38 kg/m   General Appearance: Well nourished, in no apparent distress. Eyes: PERRLA, EOMs, conjunctiva no swelling or erythema Sinuses: No Frontal/maxillary tenderness ENT/Mouth: Ext aud canals clear, TMs without erythema, bulging. No erythema, swelling, or exudate on post pharynx.  Tonsils not swollen or erythematous. Hearing normal.  Neck: Supple, thyroid normal.  Respiratory: Respiratory effort normal, BS equal bilaterally without rales, rhonchi, wheezing or stridor.  Cardio: RRR with no MRGs. Brisk peripheral pulses without edema.  Abdomen: Soft, + BS.  Non tender, no guarding, rebound, masses. Small incisional hernia present midline above umbilicus with straining. Lymphatics: Non tender without lymphadenopathy.  Musculoskeletal: Full ROM, 5/5 strength, normal gait. Right clavicle with deformity from several previous fractures. Point tenderness at the left 3rd sternocostal joint, as well as muscular tenderness over left sternocleidomastoid with neck rotation.  Skin: Warm, dry without rashes, lesions, ecchymosis.  Neuro: Cranial nerves intact. Normal muscle tone, no cerebellar symptoms. Sensation intact.  Psych: Awake and oriented X 3, normal affect, Insight and Judgment appropriate.      Izora Ribas, NP 3:42 PM [facility]

## 2016-10-19 LAB — CBC WITH DIFFERENTIAL/PLATELET
BASOS ABS: 50 {cells}/uL (ref 0–200)
Basophils Relative: 0.7 %
EOS PCT: 2.7 %
Eosinophils Absolute: 192 cells/uL (ref 15–500)
HCT: 41 % (ref 38.5–50.0)
Hemoglobin: 14.2 g/dL (ref 13.2–17.1)
Lymphs Abs: 2144 cells/uL (ref 850–3900)
MCH: 31.4 pg (ref 27.0–33.0)
MCHC: 34.6 g/dL (ref 32.0–36.0)
MCV: 90.7 fL (ref 80.0–100.0)
MPV: 10.1 fL (ref 7.5–12.5)
Monocytes Relative: 9.1 %
NEUTROS PCT: 57.3 %
Neutro Abs: 4068 cells/uL (ref 1500–7800)
Platelets: 304 10*3/uL (ref 140–400)
RBC: 4.52 10*6/uL (ref 4.20–5.80)
RDW: 13 % (ref 11.0–15.0)
TOTAL LYMPHOCYTE: 30.2 %
WBC mixed population: 646 cells/uL (ref 200–950)
WBC: 7.1 10*3/uL (ref 3.8–10.8)

## 2016-10-19 LAB — BASIC METABOLIC PANEL WITH GFR
BUN: 15 mg/dL (ref 7–25)
CALCIUM: 9.3 mg/dL (ref 8.6–10.3)
CHLORIDE: 103 mmol/L (ref 98–110)
CO2: 26 mmol/L (ref 20–32)
Creat: 0.82 mg/dL (ref 0.70–1.25)
GFR, EST AFRICAN AMERICAN: 111 mL/min/{1.73_m2} (ref >=60)
GFR, Est Non African American: 96 mL/min/{1.73_m2} (ref >=60)
GLUCOSE: 97 mg/dL (ref 65–99)
Potassium: 4 mmol/L (ref 3.5–5.3)
Sodium: 139 mmol/L (ref 135–146)

## 2016-10-19 LAB — HEPATIC FUNCTION PANEL
AG Ratio: 1.5 (calc) (ref 1.0–2.5)
ALKALINE PHOSPHATASE (APISO): 71 U/L (ref 40–115)
ALT: 27 U/L (ref 9–46)
AST: 21 U/L (ref 10–35)
Albumin: 4.3 g/dL (ref 3.6–5.1)
BILIRUBIN DIRECT: 0.1 mg/dL (ref 0.0–0.2)
BILIRUBIN INDIRECT: 0.3 mg/dL (ref 0.2–1.2)
BILIRUBIN TOTAL: 0.4 mg/dL (ref 0.2–1.2)
Globulin: 2.8 g/dL (calc) (ref 1.9–3.7)
Total Protein: 7.1 g/dL (ref 6.1–8.1)

## 2016-10-19 LAB — MAGNESIUM: MAGNESIUM: 1.9 mg/dL (ref 1.5–2.5)

## 2016-10-19 NOTE — Progress Notes (Signed)
Pt aware of lab results & voiced understanding of those results.

## 2016-11-03 ENCOUNTER — Telehealth: Payer: Self-pay | Admitting: *Deleted

## 2016-11-03 ENCOUNTER — Other Ambulatory Visit: Payer: Self-pay | Admitting: Internal Medicine

## 2016-11-03 MED ORDER — PREDNISONE 20 MG PO TABS: ORAL_TABLET | ORAL | 0 refills | Status: DC

## 2016-11-03 NOTE — Telephone Encounter (Signed)
Patient called and states he is out of town and is having back pain. He states he has Meloxicam and flexeril.  Per Dr Melford Aase, an RX for Prednisone 20 mg has been sent to the patient's requested pharmacy in [place], [place]. A message was left to inform the patient.

## 2016-11-21 ENCOUNTER — Other Ambulatory Visit: Payer: Self-pay | Admitting: Adult Health

## 2016-12-21 ENCOUNTER — Other Ambulatory Visit: Payer: Self-pay | Admitting: Adult Health

## 2016-12-21 DIAGNOSIS — F419 Anxiety disorder, unspecified: Secondary | ICD-10-CM

## 2016-12-22 ENCOUNTER — Ambulatory Visit: Payer: Self-pay | Admitting: Adult Health

## 2017-01-03 ENCOUNTER — Encounter: Payer: Self-pay | Admitting: Internal Medicine

## 2017-01-03 ENCOUNTER — Ambulatory Visit: Payer: BC Managed Care – PPO | Admitting: Internal Medicine

## 2017-01-03 VITALS — BP 116/72 | HR 80 | Temp 97.3°F | Resp 16 | Ht 67.75 in | Wt 172.0 lb

## 2017-01-03 DIAGNOSIS — R0989 Other specified symptoms and signs involving the circulatory and respiratory systems: Secondary | ICD-10-CM | POA: Diagnosis not present

## 2017-01-03 DIAGNOSIS — F419 Anxiety disorder, unspecified: Secondary | ICD-10-CM | POA: Diagnosis not present

## 2017-01-03 NOTE — Progress Notes (Signed)
  Subjective:    Patient ID: Dillon [name], male    DOB: 04/19/1955, 61 y.o.   MRN: [id number]  HPI   This nice 61 yo MWM with labile  HTN, HLD, preDM, Vit D Def and chronic anxiety returns for f/u . Patient had been tried on Celexa and within 3 weeks d/c'd due to dysphoric feelings. He was last seen abour 1 month ago and started on Buspar 7.5 mg bid and feels some better and reports his wife also reports she "can tell an improvement".  Patient has lost 1 son due to OD of illicit drugs and has a 2sd son who is in drug rehab alleging "clean" x 1&1/2 years. Patient also feels his job is a stressor.   Medication Sig  . ALPRAZolam (XANAX) 0.5 MG tablet TAKE HALF TO ONE TABLET BY MOUTH THREE TIMES DAILY AS NEEDED FOR ANXIETY  . B Complex-C (B-COMPLEX WITH VITAMIN C) tablet Take 1 tablet by mouth daily.  . busPIRone (BUSPAR) 7.5 MG tablet TAKE 1 TABLET(7.5 MG) BY MOUTH TWICE DAILY  . Cholecalciferol (VITAMIN D PO) Take 5,000 Units by mouth daily.  . cyclobenzaprine (FLEXERIL) 5 MG tablet Take 1 tablet (5 mg total) by mouth 3 (three) times daily as needed for muscle spasms.  . diphenoxylate-atropine (LOMOTIL) 2.5-0.025 MG tablet TAKE 1 TABLET BY MOUTH FOUR TIMES DAILY AS NEEDED FOR DIARRHEA OR LOOSE STOOLS  . Ibuprofen (ADVIL) 200 MG CAPS Take by mouth as needed.  . meloxicam (MOBIC) 7.5 MG tablet TAKE 1 TABLET(7.5 MG) BY MOUTH DAILY  . Multiple Vitamins-Minerals (MULTIVITAMIN WITH MINERALS) tablet Take 1 tablet by mouth daily.  . predniSONE (DELTASONE) 20 MG tablet Take 1 tablet 3 times a day.   Allergies  Allergen Reactions  . Codeine Nausea And Vomiting  . Norco [Hydrocodone-Acetaminophen]   . Penicillins Nausea And Vomiting   Past Medical History:  Diagnosis Date  . DJD (degenerative joint disease)   . Elevated hemoglobin A1c   . Hyperlipidemia   . Hypertension    Review of Systems  10 point systems review negative except as above.     Objective:   Physical Exam  BP 116/72    Pulse 80   Temp (!) 97.3 F (36.3 C)   Resp 16   Ht 5' 7.75" (1.721 m)   Wt 172 lb (78 kg)   BMI 26.35 kg/m   In No Distress. Skin clear.   HEENT - WNL. Neck - supple.  Chest - Clear equal BS. Cor - Nl HS. RRR w/o sig MGR. PP 1(+). No edema. MS- FROM w/o deformities.  Gait Nl. Neuro -  Nl w/o focal abnormalities.    Assessment & Plan:   1. Labile hypertension  - discussed meds & BP monitoring    2. Chronic Anxiety   - Discussed meds/SE's. Recommended patient increase his Buspar 7.5 mg to 3 /x day \ or 1&1/2 tab 2 x/ day   - ROV 2 months for CPE

## 2017-01-03 NOTE — Patient Instructions (Signed)

## 2017-03-21 ENCOUNTER — Ambulatory Visit: Payer: BC Managed Care – PPO | Admitting: Internal Medicine

## 2017-03-21 ENCOUNTER — Encounter: Payer: Self-pay | Admitting: Internal Medicine

## 2017-03-21 VITALS — BP 128/90 | HR 80 | Temp 97.0°F | Resp 16 | Ht 67.75 in | Wt 171.6 lb

## 2017-03-21 DIAGNOSIS — F419 Anxiety disorder, unspecified: Secondary | ICD-10-CM

## 2017-03-21 DIAGNOSIS — Z136 Encounter for screening for cardiovascular disorders: Secondary | ICD-10-CM

## 2017-03-21 DIAGNOSIS — R5383 Other fatigue: Secondary | ICD-10-CM

## 2017-03-21 DIAGNOSIS — Z125 Encounter for screening for malignant neoplasm of prostate: Secondary | ICD-10-CM | POA: Diagnosis not present

## 2017-03-21 DIAGNOSIS — I10 Essential (primary) hypertension: Secondary | ICD-10-CM | POA: Diagnosis not present

## 2017-03-21 DIAGNOSIS — Z111 Encounter for screening for respiratory tuberculosis: Secondary | ICD-10-CM

## 2017-03-21 DIAGNOSIS — Z79899 Other long term (current) drug therapy: Secondary | ICD-10-CM | POA: Diagnosis not present

## 2017-03-21 DIAGNOSIS — R7303 Prediabetes: Secondary | ICD-10-CM

## 2017-03-21 DIAGNOSIS — K219 Gastro-esophageal reflux disease without esophagitis: Secondary | ICD-10-CM

## 2017-03-21 DIAGNOSIS — R0989 Other specified symptoms and signs involving the circulatory and respiratory systems: Secondary | ICD-10-CM

## 2017-03-21 DIAGNOSIS — Z0000 Encounter for general adult medical examination without abnormal findings: Secondary | ICD-10-CM | POA: Diagnosis not present

## 2017-03-21 DIAGNOSIS — E782 Mixed hyperlipidemia: Secondary | ICD-10-CM

## 2017-03-21 DIAGNOSIS — Z8249 Family history of ischemic heart disease and other diseases of the circulatory system: Secondary | ICD-10-CM

## 2017-03-21 DIAGNOSIS — E559 Vitamin D deficiency, unspecified: Secondary | ICD-10-CM

## 2017-03-21 DIAGNOSIS — Z0001 Encounter for general adult medical examination with abnormal findings: Secondary | ICD-10-CM

## 2017-03-21 MED ORDER — ALPRAZOLAM 0.25 MG PO TABS: ORAL_TABLET | ORAL | 0 refills | Status: DC

## 2017-03-21 NOTE — Progress Notes (Signed)
[facility]   Dillon [name], M.D.     Dillon [name]. Dillon [name], P.A.-C [name], [place]                [address] [place] [address] Telephone [phone] Telefax [phone] Annual  Screening/Preventative Visit  & Comprehensive Evaluation & Examination     This very nice 62 y.o. MWM presents for a Screening/Preventative Visit & comprehensive evaluation and management of multiple medical co-morbidities.  Patient has been followed for HTN, T2_NIDDM  Prediabetes, Hyperlipidemia and Vitamin D Deficiency.     Patient has hx/o Ulcerative colitis & in 2000 underwent total colectomy and he has ongoing frequent BM's up to 10-15 x/day and consistency  from liquid to partially formed. Patient also has prior hx/oGERD with sx's controlled with diet alone.     Patient is followed expectantly with labile HTN since 2013. Patient's BP has been controlled at home.  Today's BP is borderline elevated at 128/90. Patient denies any cardiac symptoms as chest pain, palpitations, shortness of breath, dizziness or ankle swelling.     Patient's hyperlipidemia is controlled with diet and medications. Patient denies myalgias or other medication SE's. Last lipids were  Lab Results  Component Value Date   CHOL 200 (H) 02/16/2016   HDL 56 02/16/2016   LDLCALC 105 (H) 02/16/2016   TRIG 195 (H) 02/16/2016   CHOLHDL 3.6 02/16/2016      Patient has hx/o prediabetes (A1c 5.7%/2013 and 5.8% /)ct 2016)  and patient denies reactive hypoglycemic symptoms, visual blurring, diabetic polys or paresthesias. Last A1c was Normal & at goal: Lab Results  Component Value Date   HGBA1C 5.4 02/16/2016       Finally, patient has history of Vitamin D Deficiency ("37"/2016)  and last vitamin D was at goal: Lab Results  Component Value Date   VD25OH 68 02/16/2016   Current Outpatient Medications on File Prior to Visit  Medication  Sig  . ALPRAZolam (XANAX) 0.5 MG tablet TAKE HALF TO ONE TABLET BY MOUTH THREE TIMES DAILY AS NEEDED FOR ANXIETY  . B Complex-C (B-COMPLEX WITH VITAMIN C) tablet Take 1 tablet by mouth daily.  . Cholecalciferol (VITAMIN D PO) Take 1,000 Units by mouth daily.   . cyclobenzaprine (FLEXERIL) 5 MG tablet Take 1 tablet (5 mg total) by mouth 3 (three) times daily as needed for muscle spasms.  . diphenoxylate-atropine (LOMOTIL) 2.5-0.025 MG tablet TAKE 1 TABLET BY MOUTH FOUR TIMES DAILY AS NEEDED FOR DIARRHEA OR LOOSE STOOLS  . Ibuprofen (ADVIL) 200 MG CAPS Take by mouth as needed.  . meloxicam (MOBIC) 7.5 MG tablet TAKE 1 TABLET(7.5 MG) BY MOUTH DAILY   No current facility-administered medications on file prior to visit.    Allergies  Allergen Reactions  . Codeine Nausea And Vomiting  . Norco [Hydrocodone-Acetaminophen]   . Penicillins Nausea And Vomiting   Past Medical History:  Diagnosis Date  . DJD (degenerative joint disease)   . Elevated hemoglobin A1c   . Hyperlipidemia   . Hypertension    Health Maintenance  Topic Date Due  . Hepatitis C Screening  12/18/1955  . HIV Screening  11/26/1970  . COLONOSCOPY  11/25/2005  . TETANUS/TDAP  02/07/2023  . INFLUENZA VACCINE  Completed   Immunization History  Administered Date(s) Administered  . Influenza Split 11/02/2013, 11/07/2014  . Influenza-Unspecified 11/23/2015, 11/17/2016  . PPD  Test 11/02/2013, 11/07/2014, 02/16/2016  . Pneumococcal Polysaccharide-23 02/16/2016  . Tdap 02/06/2013   Last Colon -  Past Surgical History:  Procedure Laterality Date  . COLON SURGERY  2000, reversal 2001   coloectomy, with reversal and now a J pouch  . ELBOW LIGAMENT RECONSTRUCTION Right July 2014   Dr Apolonio Schneiders  . ELBOW LIGAMENT RECONSTRUCTION Left Sept 2016   Dr Apolonio Schneiders  . ELBOW SURGERY Right 2014  . HAND SURGERY Right 1975  . HAND SURGERY Right 1975   Family History  Problem Relation Age of Onset  . Diabetes Mother   . Arthritis Mother         Rheumatoid  . Heart disease Father   . Gout Father   . Cancer Father        melanoma  . Cancer Sister        Breast   Social History   Socioeconomic History  . Marital status: Married    Spouse name: Not on file  . Number of children: 1 son died of a Drug OD & a 2sd son has been followed at drug Rehab.  Occupational History  . 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   Tobacco Use  . Smoking status: Never Smoker  . Smokeless tobacco: Never Used  Substance and Sexual Activity  . Alcohol use: Yes    Alcohol/week: 7.0 oz    Types: 14 Standard drinks or equivalent per week  . Drug use: No  . Sexual activity: Not on file    ROS Constitutional: Denies fever, chills, weight loss/gain, headaches, insomnia,  night sweats or change in appetite. Does c/o fatigue. Eyes: Denies redness, blurred vision, diplopia, discharge, itchy or watery eyes.  ENT: Denies discharge, congestion, post nasal drip, epistaxis, sore throat, earache, hearing loss, dental pain, Tinnitus, Vertigo, Sinus pain or snoring.  Cardio: Denies chest pain, palpitations, irregular heartbeat, syncope, dyspnea, diaphoresis, orthopnea, PND, claudication or edema Respiratory: denies cough, dyspnea, DOE, pleurisy, hoarseness, laryngitis or wheezing.  Gastrointestinal: Denies dysphagia, heartburn, reflux, water brash, pain, cramps, nausea, vomiting, bloating, diarrhea, constipation, hematemesis, melena, hematochezia, jaundice or hemorrhoids Genitourinary: Denies dysuria, frequency, urgency, nocturia, hesitancy, discharge, hematuria or flank pain Musculoskeletal: Denies arthralgia, myalgia, stiffness, Jt. Swelling, pain, limp or strain/sprain. Denies Falls. Skin: Denies puritis, rash, hives, warts, acne, eczema or change in skin lesion Neuro: No weakness, tremor, incoordination, spasms, paresthesia or pain Psychiatric: Denies confusion, memory loss or sensory loss. Denies Depression. Endocrine: Denies  change in weight, skin, hair change, nocturia, and paresthesia, diabetic polys, visual blurring or hyper / hypo glycemic episodes.  Heme/Lymph: No excessive bleeding, bruising or enlarged lymph nodes.  Physical Exam  BP 128/90   Pulse 80   Temp (!) 97 F (36.1 C)   Resp 16   Ht 5' 7.75" (1.721 m)   Wt 171 lb 9.6 oz (77.8 kg)   BMI 26.28 kg/m   General Appearance: Well nourished and well groomed and in no apparent distress.  Eyes: PERRLA, EOMs, conjunctiva no swelling or erythema, normal fundi and vessels. Sinuses: No frontal/maxillary tenderness ENT/Mouth: EACs patent / TMs  nl. Nares clear without erythema, swelling, mucoid exudates. Oral hygiene is good. No erythema, swelling, or exudate. Tongue normal, non-obstructing. Tonsils not swollen or erythematous. Hearing normal.  Neck: Supple, thyroid not palpable. No bruits, nodes or JVD. Respiratory: Respiratory effort normal.  BS equal and clear bilateral without rales, rhonci, wheezing or stridor. Cardio: Heart sounds are normal with regular rate and rhythm and no  murmurs, rubs or gallops. Peripheral pulses are normal and equal bilaterally without edema. No aortic or femoral bruits. Chest: symmetric with normal excursions and percussion.  Abdomen: Soft, with Nl bowel sounds. Nontender, no guarding, rebound, hernias, masses, or organomegaly.  Lymphatics: Non tender without lymphadenopathy.  Genitourinary: No hernias.Testes nl. DRE - prostate nl for age - smooth & firm w/o nodules. Musculoskeletal: Full ROM all peripheral extremities, joint stability, 5/5 strength, and normal gait. Skin: Warm and dry without rashes, lesions, cyanosis, clubbing or  ecchymosis.  Neuro: Cranial nerves intact, reflexes equal bilaterally. Normal muscle tone, no cerebellar symptoms. Sensation intact.  Pysch: Alert and oriented X 3 with normal affect, insight and judgment appropriate.   Assessment and Plan  1. Annual Preventative/Screening Exam   2. Labile  hypertension  - EKG 12-Lead - CBC with Differential/Platelet - BASIC METABOLIC PANEL WITH GFR - Hepatic function panel - TSH - [place], RETROPERITNL ABD,  LTD - Microalbumin / creatinine urine ratio  3. Hyperlipidemia, mixed  - EKG 12-Lead - Magnesium - Lipid panel - TSH - [place], RETROPERITNL ABD,  LTD  4. Prediabetes  - EKG 12-Lead - Hemoglobin A1c - Insulin, random - [place], RETROPERITNL ABD,  LTD  5. Vitamin D deficiency  - VITAMIN D 25 Hydroxy  6. Gastroesophageal reflux disease without esophagitis  - CBC with Differential/Platelet  7. Screening for ischemic heart disease  - EKG 12-Lead - [place], RETROPERITNL ABD,  LTD  8. Family history of coronary artery disease  - EKG 12-Lead - [place], RETROPERITNL ABD,  LTD  9. Screening for AAA (aortic abdominal aneurysm)  - EKG 12-Lead - [place], RETROPERITNL ABD,  LTD  10. Fatigue, unspecified type  - Iron,Total/Total Iron Binding Cap - Vitamin B12 - Testosterone - CBC with Differential/Platelet  11. Medication management  - Urinalysis, Routine w reflex microscopic - CBC with Differential/Platelet - Microalbumin / creatinine urine ratio  12. Prostate cancer screening  - PSA  13. Anxiety  - ALPRAZolam (XANAX) 0.25 MG tablet; Take 1/2 to 1 tablet 2 to 3 x /day as needed for Anxiety  Dispense: 90 tablet; Refill: 0  14. Screening examination for pulmonary tuberculosis  - PPD      Patient was counseled in prudent diet, weight control to achieve/maintain BMI less than 25, BP monitoring, regular exercise and medications as discussed.  Discussed med effects and SE's. Routine screening labs and tests as requested with regular follow-up as recommended. Over 40 minutes of exam, counseling, chart review and high complex critical decision making was performed

## 2017-03-21 NOTE — Patient Instructions (Signed)

## 2017-03-22 ENCOUNTER — Encounter: Payer: Self-pay | Admitting: Internal Medicine

## 2017-03-22 LAB — BASIC METABOLIC PANEL WITH GFR
BUN: 18 mg/dL (ref 7–25)
CO2: 30 mmol/L (ref 20–32)
Calcium: 9.7 mg/dL (ref 8.6–10.3)
Chloride: 103 mmol/L (ref 98–110)
Creat: 0.89 mg/dL (ref 0.70–1.25)
GFR, EST NON AFRICAN AMERICAN: 92 mL/min/{1.73_m2} (ref >=60)
GFR, Est African American: 107 mL/min/{1.73_m2} (ref >=60)
GLUCOSE: 92 mg/dL (ref 65–99)
POTASSIUM: 4.2 mmol/L (ref 3.5–5.3)
SODIUM: 140 mmol/L (ref 135–146)

## 2017-03-22 LAB — CBC WITH DIFFERENTIAL/PLATELET
BASOS ABS: 60 {cells}/uL (ref 0–200)
Basophils Relative: 1 %
EOS ABS: 120 {cells}/uL (ref 15–500)
Eosinophils Relative: 2 %
HEMATOCRIT: 44.6 % (ref 38.5–50.0)
HEMOGLOBIN: 15.5 g/dL (ref 13.2–17.1)
Lymphs Abs: 1524 cells/uL (ref 850–3900)
MCH: 31.6 pg (ref 27.0–33.0)
MCHC: 34.8 g/dL (ref 32.0–36.0)
MCV: 91 fL (ref 80.0–100.0)
MONOS PCT: 8.8 %
MPV: 9.7 fL (ref 7.5–12.5)
NEUTROS PCT: 62.8 %
Neutro Abs: 3768 cells/uL (ref 1500–7800)
Platelets: 327 10*3/uL (ref 140–400)
RBC: 4.9 10*6/uL (ref 4.20–5.80)
RDW: 12.9 % (ref 11.0–15.0)
Total Lymphocyte: 25.4 %
WBC mixed population: 528 cells/uL (ref 200–950)
WBC: 6 10*3/uL (ref 3.8–10.8)

## 2017-03-22 LAB — MICROALBUMIN / CREATININE URINE RATIO
Creatinine, Urine: 225 mg/dL (ref 20–320)
MICROALB UR: 0.8 mg/dL
MICROALB/CREAT RATIO: 4 ug/mg{creat} (ref <30)

## 2017-03-22 LAB — HEPATIC FUNCTION PANEL
AG RATIO: 1.7 (calc) (ref 1.0–2.5)
ALKALINE PHOSPHATASE (APISO): 69 U/L (ref 40–115)
ALT: 21 U/L (ref 9–46)
AST: 20 U/L (ref 10–35)
Albumin: 4.8 g/dL (ref 3.6–5.1)
BILIRUBIN DIRECT: 0.1 mg/dL (ref 0.0–0.2)
BILIRUBIN INDIRECT: 0.4 mg/dL (ref 0.2–1.2)
BILIRUBIN TOTAL: 0.5 mg/dL (ref 0.2–1.2)
Globulin: 2.8 g/dL (calc) (ref 1.9–3.7)
Total Protein: 7.6 g/dL (ref 6.1–8.1)

## 2017-03-22 LAB — MAGNESIUM: Magnesium: 2 mg/dL (ref 1.5–2.5)

## 2017-03-22 LAB — TESTOSTERONE: Testosterone: 309 ng/dL (ref 250–827)

## 2017-03-22 LAB — URINALYSIS, ROUTINE W REFLEX MICROSCOPIC
Bilirubin Urine: NEGATIVE
Glucose, UA: NEGATIVE
HGB URINE DIPSTICK: NEGATIVE
Ketones, ur: NEGATIVE
LEUKOCYTES UA: NEGATIVE
NITRITE: NEGATIVE
PROTEIN: NEGATIVE
Specific Gravity, Urine: 1.026 (ref 1.001–1.03)
pH: <=5 (ref 5.0–8.0)

## 2017-03-22 LAB — IRON, TOTAL/TOTAL IRON BINDING CAP
%SAT: 26 % (calc) (ref 15–60)
Iron: 126 ug/dL (ref 50–180)
TIBC: 479 mcg/dL (calc) — ABNORMAL HIGH (ref 250–425)

## 2017-03-22 LAB — HEMOGLOBIN A1C
Hgb A1c MFr Bld: 5.5 % of total Hgb (ref <5.7)
Mean Plasma Glucose: 111 (calc)
eAG (mmol/L): 6.2 (calc)

## 2017-03-22 LAB — LIPID PANEL
Cholesterol: 238 mg/dL — ABNORMAL HIGH (ref <200)
HDL: 71 mg/dL (ref >40)
LDL Cholesterol (Calc): 118 mg/dL (calc) — ABNORMAL HIGH
NON-HDL CHOLESTEROL (CALC): 167 mg/dL — AB (ref <130)
Total CHOL/HDL Ratio: 3.4 (calc) (ref <5.0)
Triglycerides: 341 mg/dL — ABNORMAL HIGH (ref <150)

## 2017-03-22 LAB — TSH: TSH: 2.58 m[IU]/L (ref 0.40–4.50)

## 2017-03-22 LAB — INSULIN, RANDOM: INSULIN: 3.9 u[IU]/mL (ref 2.0–19.6)

## 2017-03-22 LAB — PSA: PSA: 1 ng/mL (ref <=4.0)

## 2017-03-22 LAB — VITAMIN B12: Vitamin B-12: 426 pg/mL (ref 200–1100)

## 2017-03-22 LAB — VITAMIN D 25 HYDROXY (VIT D DEFICIENCY, FRACTURES): VIT D 25 HYDROXY: 51 ng/mL (ref 30–100)

## 2017-03-23 ENCOUNTER — Encounter: Payer: Self-pay | Admitting: *Deleted

## 2017-03-23 LAB — TB SKIN TEST
Induration: 0 mm
TB Skin Test: NEGATIVE

## 2017-09-19 ENCOUNTER — Ambulatory Visit: Payer: Self-pay | Admitting: Adult Health

## 2017-09-20 NOTE — Progress Notes (Signed)
FOLLOW UP  Assessment and Plan:   Hypertension Well controlled off of medications at this time Monitor blood pressure at home; patient to call if consistently greater than 130/80 Continue DASH diet.   Reminder to go to the ER if any CP, SOB, nausea, dizziness, severe HA, changes vision/speech, left arm numbness and tingling and jaw pain.  Cholesterol Currently mildly elevated; working on lifestyle  Continue low cholesterol diet and exercise.  Check lipid panel.   Other abnormal glucose Recent A1Cs at goal Discussed diet/exercise, weight management  Defer A1C; check CMP  BMI 26 Long discussion about weight loss, diet, and exercise Recommended diet heavy in fruits and veggies and low in animal meats, cheeses, and dairy products, appropriate calorie intake Discussed ideal weight for height  Will follow up in 3 months  Vitamin D Def Approaching goal at last visit; he has not increase dose due to intolerance continue supplementation  Defer Vit D level  Right shoulder pain (intermittent) ? Impingement, discussed will try prednisone taper, follow up ortho if not resolving.   Continue diet and meds as discussed. Further disposition pending results of labs. Discussed med's effects and SE's.   Over 30 minutes of exam, counseling, chart review, and critical decision making was performed.   Future Appointments  Date Time Provider [place]  04/05/2018  9:00 AM [name], MD [facility] None    ----------------------------------------------------------------------------------------------------------------------  HPI 62 y.o. male  presents for 3 month follow up on hypertension, cholesterol, glucose management, weight and vitamin D deficiency. Patient has history of Ulcerative colitis, s/p total colectomy in 2000 and consequently he has ongoing frequent BM's up to 10-15 x/day and consistency ranges from liquid to partially formed.  He is prescribed xanax for intermittent  anxiety; recently he reports taking 0.25 mg 2-3 tabs over 2 weeks.   Today he endorses some intermittent lower back achiness, knot in his shoulder with intermittent radiating parasthesias x 2 weeks, aggravated at work, taking meloxicam which helps but would like to do a steroid taper as his stomach is somewhat upset. Seeing Dr. Vallarie Mare.   BMI is Body mass index is 26.19 kg/m., he has been working on diet - he admits lapsing during vacation -  (very physically active job) [facility] Readings from Last 3 Encounters:  09/21/17 171 lb (77.6 kg)  03/21/17 171 lb 9.6 oz (77.8 kg)  01/03/17 172 lb (78 kg)   Today their BP is BP: 110/64  He does not workout. He denies chest pain, shortness of breath, dizziness.   He is not on cholesterol medication and denies myalgias. His cholesterol is not at goal. The cholesterol last visit was:   Lab Results  Component Value Date   CHOL 238 (H) 03/21/2017   HDL 71 03/21/2017   LDLCALC 118 (H) 03/21/2017   TRIG 341 (H) 03/21/2017   CHOLHDL 3.4 03/21/2017    He has been working on diet and exercise for glucose management, and denies foot ulcerations, increased appetite, nausea, paresthesia of the feet, polydipsia, polyuria, visual disturbances, vomiting and weight loss. Last A1C in the office was:  Lab Results  Component Value Date   HGBA1C 5.5 03/21/2017   Patient is on Vitamin D supplement.   Lab Results  Component Value Date   VD25OH 51 03/21/2017       Current Medications:  Current Outpatient Medications on File Prior to Visit  Medication Sig  . ALPRAZolam (XANAX) 0.25 MG tablet Take 1/2 to 1 tablet 2 to 3 x /day as needed  for Anxiety  . B Complex-C (B-COMPLEX WITH VITAMIN C) tablet Take 1 tablet by mouth daily.  . Cholecalciferol (VITAMIN D PO) Take 200 Units by mouth daily.   . cyclobenzaprine (FLEXERIL) 5 MG tablet Take 1 tablet (5 mg total) by mouth 3 (three) times daily as needed for muscle spasms.  . diphenoxylate-atropine (LOMOTIL) 2.5-0.025 MG  tablet TAKE 1 TABLET BY MOUTH FOUR TIMES DAILY AS NEEDED FOR DIARRHEA OR LOOSE STOOLS  . Ibuprofen (ADVIL) 200 MG CAPS Take by mouth as needed.  . meloxicam (MOBIC) 7.5 MG tablet TAKE 1 TABLET(7.5 MG) BY MOUTH DAILY   No current facility-administered medications on file prior to visit.      Allergies:  Allergies  Allergen Reactions  . Codeine Nausea And Vomiting  . Norco [Hydrocodone-Acetaminophen]   . Penicillins Nausea And Vomiting     Medical History:  Past Medical History:  Diagnosis Date  . DJD (degenerative joint disease)   . Elevated hemoglobin A1c   . Hyperlipidemia   . Hypertension    Family history- Reviewed and unchanged Social history- Reviewed and unchanged   Review of Systems:  Review of Systems  Constitutional: Negative for malaise/fatigue and weight loss.  HENT: Negative for hearing loss and tinnitus.   Eyes: Negative for blurred vision and double vision.  Respiratory: Negative for cough, shortness of breath and wheezing.   Cardiovascular: Negative for chest pain, palpitations, orthopnea, claudication and leg swelling.  Gastrointestinal: Negative for abdominal pain, blood in stool, constipation, diarrhea, heartburn, melena, nausea and vomiting.  Genitourinary: Negative.   Musculoskeletal: Positive for back pain (mild intermittent low back achiness) and joint pain (intermittent right shoulder tenderness and radiating pain). Negative for myalgias.  Skin: Negative for rash.  Neurological: Negative for dizziness, tingling, sensory change, weakness and headaches.  Endo/Heme/Allergies: Negative for polydipsia.  Psychiatric/Behavioral: Negative.   All other systems reviewed and are negative.    Physical Exam: BP 110/64   Pulse 84   Temp 97.7 F (36.5 C)   Ht 5' 7.75" (1.721 m)   Wt 171 lb (77.6 kg)   SpO2 95%   BMI 26.19 kg/m  Wt Readings from Last 3 Encounters:  09/21/17 171 lb (77.6 kg)  03/21/17 171 lb 9.6 oz (77.8 kg)  01/03/17 172 lb (78 kg)    General Appearance: Well nourished, in no apparent distress. Eyes: PERRLA, EOMs, conjunctiva no swelling or erythema Sinuses: No Frontal/maxillary tenderness ENT/Mouth: Ext aud canals clear, TMs without erythema, bulging. No erythema, swelling, or exudate on post pharynx.  Tonsils not swollen or erythematous. Hearing normal.  Neck: Supple, thyroid normal.  Respiratory: Respiratory effort normal, BS equal bilaterally without rales, rhonchi, wheezing or stridor.  Cardio: RRR with no MRGs. Brisk peripheral pulses without edema.  Abdomen: Soft, + BS.  Non tender, no guarding, rebound, hernias, masses. Lymphatics: Non tender without lymphadenopathy.  Musculoskeletal: Full ROM, 5/5 strength, Normal gait, mildly tender through deltoid without palpable abnormality, effusion Skin: Warm, dry without rashes, lesions, ecchymosis.  Neuro: Cranial nerves intact. No cerebellar symptoms.  Psych: Awake and oriented X 3, normal affect, Insight and Judgment appropriate.    Izora Ribas, NP 3:19 PM [facility]

## 2017-09-21 ENCOUNTER — Ambulatory Visit (INDEPENDENT_AMBULATORY_CARE_PROVIDER_SITE_OTHER): Payer: BC Managed Care – PPO | Admitting: Adult Health

## 2017-09-21 ENCOUNTER — Encounter: Payer: Self-pay | Admitting: Adult Health

## 2017-09-21 VITALS — BP 110/64 | HR 84 | Temp 97.7°F | Ht 67.75 in | Wt 171.0 lb

## 2017-09-21 DIAGNOSIS — Z6826 Body mass index (BMI) 26.0-26.9, adult: Secondary | ICD-10-CM

## 2017-09-21 DIAGNOSIS — E782 Mixed hyperlipidemia: Secondary | ICD-10-CM | POA: Diagnosis not present

## 2017-09-21 DIAGNOSIS — R030 Elevated blood-pressure reading, without diagnosis of hypertension: Secondary | ICD-10-CM

## 2017-09-21 DIAGNOSIS — E559 Vitamin D deficiency, unspecified: Secondary | ICD-10-CM | POA: Diagnosis not present

## 2017-09-21 DIAGNOSIS — Z79899 Other long term (current) drug therapy: Secondary | ICD-10-CM

## 2017-09-21 DIAGNOSIS — K5190 Ulcerative colitis, unspecified, without complications: Secondary | ICD-10-CM

## 2017-09-21 DIAGNOSIS — R7309 Other abnormal glucose: Secondary | ICD-10-CM

## 2017-09-21 MED ORDER — PREDNISONE 20 MG PO TABS: ORAL_TABLET | ORAL | 0 refills | Status: AC

## 2017-09-21 NOTE — Patient Instructions (Signed)
Aim for 5-10 servings of fruits and vegetables daily  65-80+ fluid ounces of water or unsweet tea for healthy kidneys  Limit to max 1 drink of alcohol per day; avoid smoking/tobacco  Limit animal fats in diet for cholesterol and heart health - choose grass fed whenever available  Avoid highly processed foods, and foods high in saturated/trans fats  Aim for low stress - take time to unwind and care for your mental health  Aim for 150 min of moderate intensity exercise weekly for heart health, and weights twice weekly for bone health  Aim for 7-9 hours of sleep daily    Preventing High Cholesterol Cholesterol is a waxy, fat-like substance that your body needs in small amounts. Your liver makes all the cholesterol that your body needs. Having high cholesterol (hypercholesterolemia) increases your risk for heart disease and stroke. Extra (excess) cholesterol comes from the food you eat, such as animal-based fat (saturated fat) from meat and some dairy products. High cholesterol can often be prevented with diet and lifestyle changes. If you already have high cholesterol, you can control it with diet and lifestyle changes, as well as medicine. What nutrition changes can be made?  Eat less saturated fat. Foods that contain saturated fat include red meat and some dairy products.  Avoid processed meats, like bacon and lunch meats.  Avoid trans fats, which are found in margarine and some baked goods.  Avoid foods and beverages that have added sugars.  Eat more fruits, vegetables, and whole grains.  Choose healthy sources of protein, such as fish, poultry, and nuts.  Choose healthy sources of fat, such as: ? Nuts. ? Vegetable oils, especially olive oil. ? Fish that have healthy fats (omega-3 fatty acids), such as mackerel or salmon. What lifestyle changes can be made?  Lose weight if you are overweight. Losing 5-10 lb (2.3-4.5 kg) can help prevent or control high cholesterol and  reduce your risk for diabetes and high blood pressure. Ask your health care provider to help you with a diet and exercise plan to safely lose weight.  Get enough exercise. Do at least 150 minutes of moderate-intensity exercise each week. ? You could do this in short exercise sessions several times a day, or you could do longer exercise sessions a few times a week. For example, you could take a brisk 10-minute walk or bike ride, 3 times a day, for 5 days a week.  Do not smoke. If you need help quitting, ask your health care provider.  Limit your alcohol intake. If you drink alcohol, limit alcohol intake to no more than 1 drink a day for nonpregnant women and 2 drinks a day for men. One drink equals 12 oz of beer, 5 oz of wine, or 1 oz of hard liquor. Why are these changes important? If you have high cholesterol, deposits (plaques) may build up on the walls of your blood vessels. Plaques make the arteries narrower and stiffer, which can restrict or block blood flow and cause blood clots to form. This greatly increases your risk for heart attack and stroke. Making diet and lifestyle changes can reduce your risk for these life-threatening conditions. What can I do to lower my risk?  Manage your risk factors for high cholesterol. Talk with your health care provider about all of your risk factors and how to lower your risk.  Manage other conditions that you have, such as diabetes or high blood pressure (hypertension).  Have your cholesterol checked at regular intervals.  Keep  all follow-up visits as told by your health care provider. This is important. How is this treated? In addition to diet and lifestyle changes, your health care provider may recommend medicines to help lower cholesterol, such as a medicine to reduce the amount of cholesterol made in your liver. You may need medicine if:  Diet and lifestyle changes do not lower your cholesterol enough.  You have high cholesterol and other risk  factors for heart disease or stroke.  Take over-the-counter and prescription medicines only as told by your health care provider. Where to find more information:  American Heart Association: ThisTune.com.pt.jsp  National Heart, Lung, and Blood Institute: FrenchToiletries.com.cy Summary  High cholesterol increases your risk for heart disease and stroke. By keeping your cholesterol level low, you can reduce your risk for these conditions.  Diet and lifestyle changes are the most important steps in preventing high cholesterol.  Work with your health care provider to manage your risk factors, and have your blood tested regularly. This information is not intended to replace advice given to you by your health care provider. Make sure you discuss any questions you have with your health care provider. Document Released: 02/02/2015 Document Revised: 09/27/2015 Document Reviewed: 09/27/2015 Elsevier Interactive Patient Education  [employer].

## 2017-09-22 LAB — CBC WITH DIFFERENTIAL/PLATELET
BASOS ABS: 47 {cells}/uL (ref 0–200)
Basophils Relative: 0.6 %
Eosinophils Absolute: 158 cells/uL (ref 15–500)
Eosinophils Relative: 2 %
HEMATOCRIT: 41.5 % (ref 38.5–50.0)
Hemoglobin: 14.1 g/dL (ref 13.2–17.1)
LYMPHS ABS: 1872 {cells}/uL (ref 850–3900)
MCH: 30.9 pg (ref 27.0–33.0)
MCHC: 34 g/dL (ref 32.0–36.0)
MCV: 90.8 fL (ref 80.0–100.0)
MPV: 10.3 fL (ref 7.5–12.5)
Monocytes Relative: 9.4 %
NEUTROS PCT: 64.3 %
Neutro Abs: 5080 cells/uL (ref 1500–7800)
Platelets: 302 10*3/uL (ref 140–400)
RBC: 4.57 10*6/uL (ref 4.20–5.80)
RDW: 13.1 % (ref 11.0–15.0)
TOTAL LYMPHOCYTE: 23.7 %
WBC: 7.9 10*3/uL (ref 3.8–10.8)
WBCMIX: 743 {cells}/uL (ref 200–950)

## 2017-09-22 LAB — COMPLETE METABOLIC PANEL WITH GFR
AG RATIO: 1.5 (calc) (ref 1.0–2.5)
ALKALINE PHOSPHATASE (APISO): 66 U/L (ref 40–115)
ALT: 26 U/L (ref 9–46)
AST: 19 U/L (ref 10–35)
Albumin: 4.4 g/dL (ref 3.6–5.1)
BILIRUBIN TOTAL: 0.4 mg/dL (ref 0.2–1.2)
BUN: 20 mg/dL (ref 7–25)
CHLORIDE: 103 mmol/L (ref 98–110)
CO2: 29 mmol/L (ref 20–32)
Calcium: 10.1 mg/dL (ref 8.6–10.3)
Creat: 1.04 mg/dL (ref 0.70–1.25)
GFR, EST NON AFRICAN AMERICAN: 77 mL/min/{1.73_m2} (ref >=60)
GFR, Est African American: 89 mL/min/{1.73_m2} (ref >=60)
GLOBULIN: 2.9 g/dL (ref 1.9–3.7)
Glucose, Bld: 99 mg/dL (ref 65–99)
POTASSIUM: 4 mmol/L (ref 3.5–5.3)
SODIUM: 140 mmol/L (ref 135–146)
Total Protein: 7.3 g/dL (ref 6.1–8.1)

## 2017-09-22 LAB — TSH: TSH: 1.8 mIU/L (ref 0.40–4.50)

## 2017-09-22 LAB — LIPID PANEL
CHOLESTEROL: 214 mg/dL — AB (ref <200)
HDL: 65 mg/dL (ref >40)
LDL Cholesterol (Calc): 120 mg/dL (calc) — ABNORMAL HIGH
NON-HDL CHOLESTEROL (CALC): 149 mg/dL — AB (ref <130)
Total CHOL/HDL Ratio: 3.3 (calc) (ref <5.0)
Triglycerides: 170 mg/dL — ABNORMAL HIGH (ref <150)

## 2017-11-03 ENCOUNTER — Ambulatory Visit: Payer: BC Managed Care – PPO | Admitting: Physician Assistant

## 2017-11-03 ENCOUNTER — Encounter: Payer: Self-pay | Admitting: Physician Assistant

## 2017-11-03 VITALS — BP 130/80 | HR 82 | Temp 97.5°F | Ht 67.75 in | Wt 171.0 lb

## 2017-11-03 DIAGNOSIS — H9201 Otalgia, right ear: Secondary | ICD-10-CM | POA: Diagnosis not present

## 2017-11-03 MED ORDER — PREDNISONE 20 MG PO TABS: ORAL_TABLET | ORAL | 0 refills | Status: DC

## 2017-11-03 NOTE — Patient Instructions (Addendum)
Your ears and sinuses are connected by the eustachian tube. When your sinuses are inflamed, this can close off the tube and cause fluid to collect in your middle ear. This can then cause dizziness, popping, clicking, ringing, and echoing in your ears. This is often NOT an infection and does NOT require antibiotics, it is caused by inflammation so the treatments help the inflammation. This can take a long time to get better so please be patient.   Here are things you can do to help with this: - Try the Flonase or Nasonex. Remember to spray each nostril twice towards the outer part of your eye.  Do not sniff but instead pinch your nose and tilt your head back to help the medicine get into your sinuses.  The best time to do this is at bedtime.Stop if you get blurred vision or nose bleeds.   -While drinking fluids, pinch and hold nose close and swallow, to help open eustachian tubes to drain fluid behind ear drums.  -Please pick one of the over the counter allergy medications below and take it once daily for allergies.  It will also help with fluid behind ear drums. Claritin or loratadine cheapest but likely the weakest  Zyrtec or certizine at night because it can make you sleepy The strongest is allegra or fexafinadine  Cheapest at [facility], [employer], [employer]  -can use decongestant over the counter, please do not use if you have high blood pressure or certain heart conditions.   if worsening HA, changes vision/speech, imbalance, weakness go to the ER

## 2017-11-03 NOTE — Progress Notes (Signed)
   Subjective:    Patient ID: Dillon [name], male    DOB: October 27, 1955, 62 y.o.   MRN: [id number]  HPI 62 y.o. WM presents with right ear pain.  Nomally has problem with right ear, flying to [place] this week so wants it looked at. No fever, chills.  Daughter and grandkids 2 kids are living with them, 1 and 4. (has 6 total). He is on xanax 1-2 x a week, not every day.   Blood pressure 130/80, pulse 82, temperature (!) 97.5 F (36.4 C), height 5' 7.75" (1.721 m), weight 171 lb (77.6 kg), SpO2 97 %.  Medications Current Outpatient Medications on File Prior to Visit  Medication Sig  . ALPRAZolam (XANAX) 0.25 MG tablet Take 1/2 to 1 tablet 2 to 3 x /day as needed for Anxiety  . B Complex-C (B-COMPLEX WITH VITAMIN C) tablet Take 1 tablet by mouth daily.  . Cholecalciferol (VITAMIN D PO) Take 200 Units by mouth daily.   . cyclobenzaprine (FLEXERIL) 5 MG tablet Take 1 tablet (5 mg total) by mouth 3 (three) times daily as needed for muscle spasms.  . diphenoxylate-atropine (LOMOTIL) 2.5-0.025 MG tablet TAKE 1 TABLET BY MOUTH FOUR TIMES DAILY AS NEEDED FOR DIARRHEA OR LOOSE STOOLS  . Ibuprofen (ADVIL) 200 MG CAPS Take by mouth as needed.  . meloxicam (MOBIC) 7.5 MG tablet TAKE 1 TABLET(7.5 MG) BY MOUTH DAILY   No current facility-administered medications on file prior to visit.     Problem list He has DJD (degenerative joint disease); Elevated hemoglobin A1c; Ulcerative colitis ([place]); Mixed hyperlipidemia; Elevated blood pressure reading without diagnosis of hypertension; Vitamin D deficiency; Medication management; and BMI 26.0-26.9,adult on their problem list.   Review of Systems     Objective:   Physical Exam  Constitutional: He is oriented to person, place, and time. He appears well-developed and well-nourished.  HENT:  Head: Normocephalic and atraumatic.  Right Ear: Hearing normal. No tenderness. Tympanic membrane is not injected, not perforated and not erythematous. A middle ear  effusion is present.  Left Ear: Hearing normal. No tenderness. Tympanic membrane is not injected, not perforated and not erythematous. A middle ear effusion is present.  Nose: Right sinus exhibits no maxillary sinus tenderness. Left sinus exhibits no maxillary sinus tenderness.  Mouth/Throat: Uvula is midline and mucous membranes are normal. Posterior oropharyngeal erythema present. No oropharyngeal exudate, posterior oropharyngeal edema or tonsillar abscesses.  Eyes: Pupils are equal, round, and reactive to light. Conjunctivae are normal.  Neck: Normal range of motion. Neck supple.  Cardiovascular: Normal rate and regular rhythm.  Pulmonary/Chest: Effort normal and breath sounds normal.  Abdominal: Soft. Bowel sounds are normal.  Musculoskeletal: Normal range of motion.  Lymphadenopathy:    He has no cervical adenopathy.  Neurological: He is alert and oriented to person, place, and time.  Skin: Skin is warm and dry. No rash noted.         Assessment & Plan:  Rein was seen today for ear problem.  Diagnoses and all orders for this visit:  Right ear pain -Allergy pill, flonase, prednisone, autoinflation, explained no need for ABX at this time.  - if not better 2-4 weeks will refer to ENT -     predniSONE (DELTASONE) 20 MG tablet; 2 tablets daily for 3 days, 1 tablet daily for 4 days.

## 2018-04-04 NOTE — Progress Notes (Signed)
[facility]   [name], M.D.     Uvaldo Bristle. [name], P.A.-C [name], [employer]                [address] [place] [address] Telephone [phone] Telefax [phone] Annual  Screening/Preventative Visit  & Comprehensive Evaluation & Examination     This very nice 63 y.o. mwm presents for a Screening /Preventative Visit & comprehensive evaluation and management of multiple medical co-morbidities.  Patient has been followed for HTN, HLD, Prediabetes and Vitamin D Deficiency.  In 2000, he underwent total colectomy for Ulcerative Colitis for frequent BM's up to 10-15 /day partially to poorly formed.  Patient also has hx/o GERD controlled by diet.      Patient has hx/o labile HTN followed expectantly since 2013. Patient's BP has been controlled at home.  Today's BP is at goal - 132/84. Patient denies any cardiac symptoms as chest pain, palpitations, shortness of breath, dizziness or ankle swelling.     Patient's hyperlipidemia is controlled with diet and medications. Patient denies myalgias or other medication SE's. Last lipids were not at goal: Lab Results  Component Value Date   CHOL 214 (H) 09/21/2017   HDL 65 09/21/2017   LDLCALC 120 (H) 09/21/2017   TRIG 170 (H) 09/21/2017   CHOLHDL 3.3 09/21/2017      Patient has hx/o prediabetes (A1c 5.7% / 2013 and 5.8% / 2016)  and patient denies reactive hypoglycemic symptoms, visual blurring, diabetic polys or paresthesias. Last A1c was Normal & at goal: Lab Results  Component Value Date   HGBA1C 5.5 03/21/2017       Finally, patient has history of Vitamin D Deficiency  ("37" / 2016) and last vitamin D was not at goal: Lab Results  Component Value Date   VD25OH 51 03/21/2017   Current Outpatient Medications on File Prior to Visit  Medication Sig  . B Complex-C (B-COMPLEX WITH VITAMIN C) tablet Take 1 tablet by mouth daily.  .  Cholecalciferol (VITAMIN D PO) Take 200 Units by mouth daily.   . cyclobenzaprine (FLEXERIL) 5 MG tablet Take 1 tablet (5 mg total) by mouth 3 (three) times daily as needed for muscle spasms.  . diphenoxylate-atropine (LOMOTIL) 2.5-0.025 MG tablet TAKE 1 TABLET BY MOUTH FOUR TIMES DAILY AS NEEDED FOR DIARRHEA OR LOOSE STOOLS  . Ibuprofen (ADVIL) 200 MG CAPS Take by mouth as needed.  . meloxicam (MOBIC) 7.5 MG tablet TAKE 1 TABLET(7.5 MG) BY MOUTH DAILY   No current facility-administered medications on file prior to visit.    Allergies  Allergen Reactions  . Codeine Nausea And Vomiting  . Norco [Hydrocodone-Acetaminophen]   . Penicillins Nausea And Vomiting   Past Medical History:  Diagnosis Date  . DJD (degenerative joint disease)   . Elevated hemoglobin A1c   . Hyperlipidemia   . Hypertension    Health Maintenance  Topic Date Due  . Hepatitis C Screening  1955/08/09  . HIV Screening  11/26/1970  . COLONOSCOPY  11/25/2005  . INFLUENZA VACCINE  09/01/2017  . TETANUS/TDAP  02/07/2023   Immunization History  Administered Date(s) Administered  . Influenza Split 11/02/2013, 11/07/2014  . Influenza-Unspecified 11/23/2015, 11/17/2016  . PPD Test 11/02/2013, 11/07/2014, 02/16/2016, 03/21/2017, 04/05/2018  . Pneumococcal Polysaccharide-23 02/16/2016  . Tdap 02/06/2013   Total Colectomy w/colostony in 2000  & reversal in 2001 Past  Surgical History:  Procedure Laterality Date  . COLON SURGERY  2000, reversal 2001   coloectomy, with reversal and now a J pouch  . ELBOW LIGAMENT RECONSTRUCTION Right July 2014   Dr Apolonio Schneiders  . ELBOW LIGAMENT RECONSTRUCTION Left Sept 2016   Dr Apolonio Schneiders  . ELBOW SURGERY Right 2014  . HAND SURGERY Right 1975  . HAND SURGERY Right 1975   Family History  Problem Relation Age of Onset  . Diabetes Mother   . Arthritis Mother        Rheumatoid  . Heart disease Father   . Gout Father   . Cancer Father        melanoma  . Cancer Sister        Breast    Social History   Socioeconomic History  . Marital status: Married    Spouse name: Mechele Claude  . Number of children: 1 son & 1 daughter  . Years of education: Not on file  . Highest education level: Not on file  Occupational History  . Printer for [employer]  Tobacco Use  . Smoking status: Never Smoker  . Smokeless tobacco: Never Used  Substance and Sexual Activity  . Alcohol use: Yes    Comment: occa smixed drink  . Drug use: No  . Sexual activity: Yes    ROS Constitutional: Denies fever, chills, weight loss/gain, headaches, insomnia,  night sweats or change in appetite. Does c/o fatigue. Eyes: Denies redness, blurred vision, diplopia, discharge, itchy or watery eyes.  ENT: Denies discharge, congestion, post nasal drip, epistaxis, sore throat, earache, hearing loss, dental pain, Tinnitus, Vertigo, Sinus pain or snoring.  Cardio: Denies chest pain, palpitations, irregular heartbeat, syncope, dyspnea, diaphoresis, orthopnea, PND, claudication or edema Respiratory: denies cough, dyspnea, DOE, pleurisy, hoarseness, laryngitis or wheezing.  Gastrointestinal: Denies dysphagia, heartburn, reflux, water brash, pain, cramps, nausea, vomiting, bloating,  hematemesis, melena, hematochezia, jaundice or hemorrhoids Genitourinary: Denies dysuria, frequency, discharge, hematuria or flank pain.  Has urgency, nocturia x 2-3 & occasional hesitancy. Musculoskeletal: Denies arthralgia, myalgia, stiffness, Jt. Swelling, pain, limp or strain/sprain. Denies Falls. Skin: Denies puritis, rash, hives, warts, acne, eczema or change in skin lesion Neuro: No weakness, tremor, incoordination, spasms, paresthesia or pain Psychiatric: Denies confusion, memory loss or sensory loss. Denies Depression. Endocrine: Denies change in weight, skin, hair change, nocturia, and paresthesia, diabetic polys, visual blurring or hyper / hypo glycemic episodes.  Heme/Lymph: No excessive bleeding, bruising or enlarged  lymph nodes.  Physical Exam  BP 132/84   Pulse 84   Temp (!) 97 F (36.1 C)   Resp 16   Ht 5' 7.75" (1.721 m)   Wt 169 lb 12.8 oz (77 kg)   BMI 26.01 kg/m   General Appearance: Well nourished and well groomed and in no apparent distress.  Eyes: PERRLA, EOMs, conjunctiva no swelling or erythema, normal fundi and vessels. Sinuses: No frontal/maxillary tenderness ENT/Mouth: EACs patent / TMs  nl. Nares clear without erythema, swelling, mucoid exudates. Oral hygiene is good. No erythema, swelling, or exudate. Tongue normal, non-obstructing. Tonsils not swollen or erythematous. Hearing normal.  Neck: Supple, thyroid not palpable. No bruits, nodes or JVD. Respiratory: Respiratory effort normal.  BS equal and clear bilateral without rales, rhonci, wheezing or stridor. Cardio: Heart sounds are normal with regular rate and rhythm and no murmurs, rubs or gallops. Peripheral pulses are normal and equal bilaterally without edema. No aortic or femoral bruits. Chest: symmetric with normal excursions and percussion.  Abdomen: Soft, with Nl bowel sounds.  Nontender, no guarding, rebound, hernias, masses, or organomegaly.  Lymphatics: Non tender without lymphadenopathy.  Musculoskeletal: Full ROM all peripheral extremities, joint stability, 5/5 strength, and normal gait. Skin: Warm and dry without rashes, lesions, cyanosis, clubbing or  ecchymosis.  Neuro: Cranial nerves intact, reflexes equal bilaterally. Normal muscle tone, no cerebellar symptoms. Sensation intact.  Pysch: Alert and oriented X 3 with normal affect, insight and judgment appropriate.   Assessment and Plan  1. Annual Preventative/Screening Exam   2. Labile hypertension  - EKG 12-Lead - [place], RETROPERITNL ABD,  LTD - Urinalysis, Routine w reflex microscopic - Microalbumin / creatinine urine ratio - CBC with Differential/Platelet - COMPLETE METABOLIC PANEL WITH GFR - Magnesium - TSH  3. Hyperlipidemia, mixed  - EKG 12-Lead -  [place], RETROPERITNL ABD,  LTD - Lipid panel - TSH  4. Abnormal glucose  - EKG 12-Lead - [place], RETROPERITNL ABD,  LTD - Hemoglobin A1c - Insulin, random  5. Vitamin D deficiency  - VITAMIN D 25 Hydroxyl  6. Prediabetes  - EKG 12-Lead - [place], RETROPERITNL ABD,  LTD - Hemoglobin A1c - Insulin, random  7. Gastroesophageal reflux disease  - CBC with Differential/Platelet  8. Screening for ischemic heart disease  - EKG 12-Lead  9. FHx: heart disease  - EKG 12-Lead - [place], RETROPERITNL ABD,  LTD  10. Screening for AAA (aortic abdominal aneurysm)  - [place], RETROPERITNL ABD,  LTD  11. BPH with obstruction/lower urinary tract symptoms  - PSA  12. Prostate cancer screening  - PSA  13. Screening examination for pulmonary tuberculosis  - TB Skin Test  14. Fatigue  - Iron,Total/Total Iron Binding Cap - Vitamin B12 - Testosterone - CBC with Differential/Platelet  15. Medication management  - Urinalysis, Routine w reflex microscopic - Microalbumin / creatinine urine ratio - CBC with Differential/Platelet - COMPLETE METABOLIC PANEL WITH GFR - Magnesium - Lipid panel - TSH - Hemoglobin A1c - Insulin, random - VITAMIN D 25 Hydroxy  16. Screening for cancer of the rectum  - POC Hemoccult Bld/Stl         Patient was counseled in prudent diet, weight control to achieve/maintain BMI less than 25, BP monitoring, regular exercise and medications as discussed.  Discussed med effects and SE's. Routine screening labs and tests as requested with regular follow-up as recommended. Over 40 minutes of exam, counseling, chart review and high complex critical decision making was performed

## 2018-04-05 ENCOUNTER — Encounter: Payer: Self-pay | Admitting: Internal Medicine

## 2018-04-05 ENCOUNTER — Ambulatory Visit: Payer: BC Managed Care – PPO | Admitting: Internal Medicine

## 2018-04-05 VITALS — BP 132/84 | HR 84 | Temp 97.0°F | Resp 16 | Ht 67.75 in | Wt 169.8 lb

## 2018-04-05 DIAGNOSIS — I10 Essential (primary) hypertension: Secondary | ICD-10-CM

## 2018-04-05 DIAGNOSIS — Z13220 Encounter for screening for lipoid disorders: Secondary | ICD-10-CM

## 2018-04-05 DIAGNOSIS — Z130 Encounter for screening for diseases of the blood and blood-forming organs and certain disorders involving the immune mechanism: Secondary | ICD-10-CM | POA: Diagnosis not present

## 2018-04-05 DIAGNOSIS — E559 Vitamin D deficiency, unspecified: Secondary | ICD-10-CM

## 2018-04-05 DIAGNOSIS — R350 Frequency of micturition: Secondary | ICD-10-CM

## 2018-04-05 DIAGNOSIS — Z1212 Encounter for screening for malignant neoplasm of rectum: Secondary | ICD-10-CM

## 2018-04-05 DIAGNOSIS — Z136 Encounter for screening for cardiovascular disorders: Secondary | ICD-10-CM

## 2018-04-05 DIAGNOSIS — Z131 Encounter for screening for diabetes mellitus: Secondary | ICD-10-CM | POA: Diagnosis not present

## 2018-04-05 DIAGNOSIS — Z1389 Encounter for screening for other disorder: Secondary | ICD-10-CM

## 2018-04-05 DIAGNOSIS — R7303 Prediabetes: Secondary | ICD-10-CM

## 2018-04-05 DIAGNOSIS — Z0001 Encounter for general adult medical examination with abnormal findings: Secondary | ICD-10-CM

## 2018-04-05 DIAGNOSIS — E782 Mixed hyperlipidemia: Secondary | ICD-10-CM

## 2018-04-05 DIAGNOSIS — Z1329 Encounter for screening for other suspected endocrine disorder: Secondary | ICD-10-CM

## 2018-04-05 DIAGNOSIS — Z8249 Family history of ischemic heart disease and other diseases of the circulatory system: Secondary | ICD-10-CM

## 2018-04-05 DIAGNOSIS — Z125 Encounter for screening for malignant neoplasm of prostate: Secondary | ICD-10-CM

## 2018-04-05 DIAGNOSIS — F419 Anxiety disorder, unspecified: Secondary | ICD-10-CM

## 2018-04-05 DIAGNOSIS — N138 Other obstructive and reflux uropathy: Secondary | ICD-10-CM

## 2018-04-05 DIAGNOSIS — N401 Enlarged prostate with lower urinary tract symptoms: Secondary | ICD-10-CM | POA: Diagnosis not present

## 2018-04-05 DIAGNOSIS — Z79899 Other long term (current) drug therapy: Secondary | ICD-10-CM | POA: Diagnosis not present

## 2018-04-05 DIAGNOSIS — Z0000 Encounter for general adult medical examination without abnormal findings: Secondary | ICD-10-CM

## 2018-04-05 DIAGNOSIS — R0989 Other specified symptoms and signs involving the circulatory and respiratory systems: Secondary | ICD-10-CM

## 2018-04-05 DIAGNOSIS — Z111 Encounter for screening for respiratory tuberculosis: Secondary | ICD-10-CM | POA: Diagnosis not present

## 2018-04-05 DIAGNOSIS — R5383 Other fatigue: Secondary | ICD-10-CM

## 2018-04-05 DIAGNOSIS — K219 Gastro-esophageal reflux disease without esophagitis: Secondary | ICD-10-CM

## 2018-04-05 DIAGNOSIS — R7309 Other abnormal glucose: Secondary | ICD-10-CM

## 2018-04-05 MED ORDER — ESCITALOPRAM OXALATE 10 MG PO TABS: ORAL_TABLET | ORAL | 3 refills | Status: DC

## 2018-04-05 MED ORDER — ALPRAZOLAM 0.5 MG PO TABS: ORAL_TABLET | ORAL | 0 refills | Status: DC

## 2018-04-05 NOTE — Patient Instructions (Signed)

## 2018-04-06 ENCOUNTER — Other Ambulatory Visit: Payer: Self-pay | Admitting: Internal Medicine

## 2018-04-06 DIAGNOSIS — E782 Mixed hyperlipidemia: Secondary | ICD-10-CM

## 2018-04-06 LAB — COMPLETE METABOLIC PANEL WITH GFR
AG Ratio: 1.8 (calc) (ref 1.0–2.5)
ALT: 42 U/L (ref 9–46)
AST: 24 U/L (ref 10–35)
Albumin: 4.9 g/dL (ref 3.6–5.1)
Alkaline phosphatase (APISO): 66 U/L (ref 35–144)
BILIRUBIN TOTAL: 0.7 mg/dL (ref 0.2–1.2)
BUN: 12 mg/dL (ref 7–25)
CO2: 28 mmol/L (ref 20–32)
Calcium: 9.8 mg/dL (ref 8.6–10.3)
Chloride: 101 mmol/L (ref 98–110)
Creat: 0.92 mg/dL (ref 0.70–1.25)
GFR, Est African American: 103 mL/min/{1.73_m2} (ref >=60)
GFR, Est Non African American: 89 mL/min/{1.73_m2} (ref >=60)
Globulin: 2.7 g/dL (calc) (ref 1.9–3.7)
Glucose, Bld: 99 mg/dL (ref 65–99)
POTASSIUM: 4.2 mmol/L (ref 3.5–5.3)
Sodium: 140 mmol/L (ref 135–146)
Total Protein: 7.6 g/dL (ref 6.1–8.1)

## 2018-04-06 LAB — VITAMIN D 25 HYDROXY (VIT D DEFICIENCY, FRACTURES): Vit D, 25-Hydroxy: 44 ng/mL (ref 30–100)

## 2018-04-06 LAB — CBC WITH DIFFERENTIAL/PLATELET
ABSOLUTE MONOCYTES: 503 {cells}/uL (ref 200–950)
Basophils Absolute: 60 cells/uL (ref 0–200)
Basophils Relative: 0.9 %
Eosinophils Absolute: 40 cells/uL (ref 15–500)
Eosinophils Relative: 0.6 %
HCT: 45 % (ref 38.5–50.0)
Hemoglobin: 15.5 g/dL (ref 13.2–17.1)
Lymphs Abs: 1233 cells/uL (ref 850–3900)
MCH: 31.6 pg (ref 27.0–33.0)
MCHC: 34.4 g/dL (ref 32.0–36.0)
MCV: 91.6 fL (ref 80.0–100.0)
MPV: 10.6 fL (ref 7.5–12.5)
Monocytes Relative: 7.5 %
Neutro Abs: 4864 cells/uL (ref 1500–7800)
Neutrophils Relative %: 72.6 %
Platelets: 342 10*3/uL (ref 140–400)
RBC: 4.91 10*6/uL (ref 4.20–5.80)
RDW: 12.9 % (ref 11.0–15.0)
Total Lymphocyte: 18.4 %
WBC: 6.7 10*3/uL (ref 3.8–10.8)

## 2018-04-06 LAB — MAGNESIUM: Magnesium: 2.1 mg/dL (ref 1.5–2.5)

## 2018-04-06 LAB — URINALYSIS, ROUTINE W REFLEX MICROSCOPIC
Bacteria, UA: NONE SEEN /HPF
Bilirubin Urine: NEGATIVE
Glucose, UA: NEGATIVE
Hgb urine dipstick: NEGATIVE
Hyaline Cast: NONE SEEN /LPF
LEUKOCYTE UA: NEGATIVE
Nitrite: NEGATIVE
RBC / HPF: NONE SEEN /HPF (ref 0–2)
Specific Gravity, Urine: 1.027 (ref 1.001–1.03)
Squamous Epithelial / HPF: NONE SEEN /HPF (ref <=5)
WBC, UA: NONE SEEN /HPF (ref 0–5)
pH: <=5 (ref 5.0–8.0)

## 2018-04-06 LAB — TESTOSTERONE: Testosterone: 161 ng/dL — ABNORMAL LOW (ref 250–827)

## 2018-04-06 LAB — INSULIN, RANDOM: Insulin: 1.5 u[IU]/mL

## 2018-04-06 LAB — MICROALBUMIN / CREATININE URINE RATIO
CREATININE, URINE: 426 mg/dL — AB (ref 20–320)
Microalb Creat Ratio: 4 mcg/mg creat (ref <30)
Microalb, Ur: 1.6 mg/dL

## 2018-04-06 LAB — LIPID PANEL
Cholesterol: 223 mg/dL — ABNORMAL HIGH (ref <200)
HDL: 66 mg/dL (ref >=40)
LDL Cholesterol (Calc): 128 mg/dL (calc) — ABNORMAL HIGH
NON-HDL CHOLESTEROL (CALC): 157 mg/dL — AB (ref <130)
Total CHOL/HDL Ratio: 3.4 (calc) (ref <5.0)
Triglycerides: 171 mg/dL — ABNORMAL HIGH (ref <150)

## 2018-04-06 LAB — IRON, TOTAL/TOTAL IRON BINDING CAP
%SAT: 21 % (calc) (ref 20–48)
IRON: 97 ug/dL (ref 50–180)
TIBC: 461 mcg/dL (calc) — ABNORMAL HIGH (ref 250–425)

## 2018-04-06 LAB — HEMOGLOBIN A1C
Hgb A1c MFr Bld: 5.5 % of total Hgb (ref <5.7)
Mean Plasma Glucose: 111 (calc)
eAG (mmol/L): 6.2 (calc)

## 2018-04-06 LAB — VITAMIN B12: Vitamin B-12: 419 pg/mL (ref 200–1100)

## 2018-04-06 LAB — PSA: PSA: 0.9 ng/mL (ref <=4.0)

## 2018-04-06 LAB — TSH: TSH: 1.33 mIU/L (ref 0.40–4.50)

## 2018-04-06 MED ORDER — ROSUVASTATIN CALCIUM 40 MG PO TABS: ORAL_TABLET | ORAL | 1 refills | Status: DC

## 2018-04-07 LAB — TB SKIN TEST
Induration: 0 mm
TB Skin Test: NEGATIVE

## 2018-04-26 ENCOUNTER — Ambulatory Visit: Payer: Self-pay | Admitting: Internal Medicine

## 2018-07-10 NOTE — Progress Notes (Addendum)
3 MONTH FOLLOW UP  Assessment and Plan:  Dillon [name] was seen today for follow-up.  Diagnoses and all orders for this visit:  Labile hypertension Doing well No medications at this time Monitor blood pressure at home; call if consistently over 130/80 Continue DASH diet.   Reminder to go to the ER if any CP, SOB, nausea, dizziness, severe HA, changes vision/speech, left arm numbness and tingling and jaw pain. -     CBC with Differential/Platelet -     COMPLETE METABOLIC PANEL WITH GFR -     Magnesium  Hyperlipidemia, mixed Continue medications: Crestor 24m nightly Continue low cholesterol diet and exercise.  Check lipid panel.  -     Lipid panel  Gastroesophageal reflux disease without esophagitis Doing well managed by diet and monitoring No medications at this time  BPH with obstruction/lower urinary tract symptoms Wakes once a night, no changes to this No medications at this time Continue to monitor PSA yearly  Ulcerative colitis without complications, unspecified location ([facility] Doing well, monitors diet Has Lomotil PRN Has not needed this once in past three months  Actinic keratosis of left side of forehead Cryo surgery, see procedure After care and monitoring discussed with patient  Chronic anxiety Taking lexapro daily doing well with this Has alprazolam PRN -continue medications, stress management techniques discussed, increase water, good sleep hygiene discussed, increase exercise, and increase veggies.   Poor Sleep/Witness apneic spells Reports falling asleep without difficulties Will do referral for sleep study.  Vitamin D deficiency Continue supplementation -     VITAMIN D 25 Hydroxy (Vit-D Deficiency)  Abnormal glucose Discussed dietary and exercise modifications -     Hemoglobin A1c -     Insulin, random  BMI 26.0-26.9,adult Discussed dietary and exercise modifications -     Hemoglobin A1c -     Insulin, random  Medication management -     CBC  with Differential/Platelet -     COMPLETE METABOLIC PANEL WITH GFR -     Magnesium -     Lipid panel -     TSH -     Hemoglobin A1c -     Insulin, random -     VITAMIN D 25 Hydroxy (Vit-D Deficiency, Fractures)  Screening for HIV (human immunodeficiency virus) -     HIV Antibody (routine testing w rflx), once      Continue diet and meds as discussed. Further disposition pending results of labs. Discussed med's effects and SE's.   Over 30 minutes of exam, counseling, chart review, and critical decision making was performed.    Future Appointments  Date Time Provider DRio Oso 07/11/2018  3:00 PM MGarnet Sierras NP [facility] None  10/10/2018  3:30 PM CLiane Comber NP [facility] None  04/25/2019  9:00 AM MUnk Pinto MD [facility] None    ----------------------------------------------------------------------------------------------------------------------  HPI 63y.o. male  presents for 3 month follow up on HTN, HLD, prediabetes, Depression, weight and vitamin D deficiency and GERD diet controlled. Patient has history of Ulcerative colitis, s/p total colectomy in 2000 and consequently he has ongoing frequent BM's up to 8-10 x/day and consistency ranges from liquid to partially formed.  He is prescribed xanax for intermittent anxiety; recently he reports taking 0.25 mg 1-3 tabs over 2 weeks.   He reports that wife has witness episodes where he stops breathing at night.  Also reports that he snores very loudly.  He reports sleeping through the night but does not feel rested in the morning.  He  has never had a sleep evaluation.  BMI is There is no height or weight on file to calculate BMI., he has been working on diet and his job is physically strenuous.  Wt Readings from Last 3 Encounters:  04/05/18 169 lb 12.8 oz (77 kg)  11/03/17 171 lb (77.6 kg)  09/21/17 171 lb (77.6 kg)   He has had HTN since 2013.  Today their BP is    He does not do purposeful exercise.  He  denies chest pain, shortness of breath, dizziness.   He is on cholesterol medication and denies myalgias. His cholesterol is at goal. The cholesterol last visit was:   Lab Results  Component Value Date   CHOL 223 (H) 04/05/2018   HDL 66 04/05/2018   LDLCALC 128 (H) 04/05/2018   TRIG 171 (H) 04/05/2018   CHOLHDL 3.4 04/05/2018    History of prediabetes (A1c 5.7%, 2013 and 5.8%, 2016).  He has been working on diet and for glucose management, and denies foot ulcerations, increased appetite, nausea, paresthesia of the feet, polydipsia, polyuria, visual disturbances, vomiting and weight loss. Last A1C in the office was:  Lab Results  Component Value Date   HGBA1C 5.5 04/05/2018   Patient has Vitamin D Deficiency (37, 2016) and on Vitamin D supplementation. Not to goal last visit. Lab Results  Component Value Date   VD25OH 44 04/05/2018       Current Medications:  Current Outpatient Medications on File Prior to Visit  Medication Sig  . ALPRAZolam (XANAX) 0.5 MG tablet Take 1/2 to 1 tablet 2 to 3 x /day as needed for Anxiety  . B Complex-C (B-COMPLEX WITH VITAMIN C) tablet Take 1 tablet by mouth daily.  . Cholecalciferol (VITAMIN D PO) Take 200 Units by mouth daily.   . cyclobenzaprine (FLEXERIL) 5 MG tablet Take 1 tablet (5 mg total) by mouth 3 (three) times daily as needed for muscle spasms.  . diphenoxylate-atropine (LOMOTIL) 2.5-0.025 MG tablet TAKE 1 TABLET BY MOUTH FOUR TIMES DAILY AS NEEDED FOR DIARRHEA OR LOOSE STOOLS  . escitalopram (LEXAPRO) 10 MG tablet Take 1 tablet daily for Mood  . Ibuprofen (ADVIL) 200 MG CAPS Take by mouth as needed.  . meloxicam (MOBIC) 7.5 MG tablet TAKE 1 TABLET(7.5 MG) BY MOUTH DAILY  . rosuvastatin (CRESTOR) 40 MG tablet Take 1/2 to 1 tablet daily or as directed for Cholesterol   No current facility-administered medications on file prior to visit.      Allergies:  Allergies  Allergen Reactions  . Codeine Nausea And Vomiting  . Norco  [Hydrocodone-Acetaminophen]   . Penicillins Nausea And Vomiting     Medical History:  Past Medical History:  Diagnosis Date  . DJD (degenerative joint disease)   . Elevated hemoglobin A1c   . Hyperlipidemia   . Hypertension    Family history- Reviewed and unchanged Social history- Reviewed and unchanged  Past Surgical History:  Procedure Laterality Date  . COLON SURGERY  2000, reversal 2001   coloectomy, with reversal and now a J pouch  . ELBOW LIGAMENT RECONSTRUCTION Right July 2014   Dr Apolonio Schneiders  . ELBOW LIGAMENT RECONSTRUCTION Left Sept 2016   Dr Apolonio Schneiders  . ELBOW SURGERY Right 2014  . HAND SURGERY Right 1975  . HAND SURGERY Right 1975   Total Colectomy w/colostony in 2000  & reversal in 2001  Immunization History  Administered Date(s) Administered  . Influenza Split 11/02/2013, 11/07/2014  . Influenza-Unspecified 11/23/2015, 11/17/2016  . PPD Test 11/02/2013, 11/07/2014, 02/16/2016,  03/21/2017, 04/05/2018  . Pneumococcal Polysaccharide-23 02/16/2016  . Tdap 02/06/2013    Names of Other Physician/Practitioners you currently use: 1. [facility] here for primary care 2. Eye exam 2020 3. Dental exam Due, canceled related to [place]. Will reschedule when possible. Patient Care Team: [name], MD as PCP - General (Internal Medicine)    Screening Tests: Immunization History  Administered Date(s) Administered  . Influenza Split 11/02/2013, 11/07/2014  . Influenza-Unspecified 11/23/2015, 11/17/2016  . PPD Test 11/02/2013, 11/07/2014, 02/16/2016, 03/21/2017, 04/05/2018  . Pneumococcal Polysaccharide-23 02/16/2016  . Tdap 02/06/2013    Preventative care: Last colonoscopy: 2007,  colectomy Hep C Screening: Dr Claiborne Billings 2014, pt reported?  Vaccinations: TD or Tdap: 2015  Influenza: Due for 2020 Pneumococcal: 2018 Prevnar13: Due at 63yo Shingles/Zostavax: Discussed Shingrix with patient    Review of Systems:  Review of  Systems  Constitutional: Negative for chills, diaphoresis, fever, malaise/fatigue and weight loss.  HENT: Negative for congestion, ear discharge, ear pain, hearing loss, nosebleeds, sinus pain, sore throat and tinnitus.   Eyes: Negative for blurred vision, double vision, photophobia, pain, discharge and redness.  Respiratory: Negative for cough, hemoptysis, sputum production, shortness of breath, wheezing and stridor.   Cardiovascular: Negative for chest pain, palpitations, orthopnea, claudication, leg swelling and PND.  Gastrointestinal: Negative for abdominal pain, blood in stool, constipation, diarrhea, heartburn, melena, nausea and vomiting.  Genitourinary: Negative for dysuria, flank pain, frequency, hematuria and urgency.  Musculoskeletal: Negative for back pain, falls, joint pain, myalgias and neck pain.  Skin: Negative for itching and rash.  Neurological: Negative for dizziness, tingling, tremors, sensory change, speech change, focal weakness, seizures, loss of consciousness, weakness and headaches.  Endo/Heme/Allergies: Negative for environmental allergies and polydipsia. Does not bruise/bleed easily.  Psychiatric/Behavioral: Negative for depression, hallucinations, memory loss, substance abuse and suicidal ideas. The patient is not nervous/anxious and does not have insomnia.      Physical Exam: There were no vitals taken for this visit. Wt Readings from Last 3 Encounters:  04/05/18 169 lb 12.8 oz (77 kg)  11/03/17 171 lb (77.6 kg)  09/21/17 171 lb (77.6 kg)   General Appearance: Well nourished, in no apparent distress. Eyes: PERRLA, EOMs, conjunctiva no swelling or erythema Sinuses: No Frontal/maxillary tenderness ENT/Mouth: Ext aud canals clear, TMs without erythema, bulging. No erythema, swelling, or exudate on post pharynx.  Tonsils not swollen or erythematous. Hearing normal.  Neck: Supple, thyroid normal.  Respiratory: Respiratory effort normal, BS equal bilaterally without  rales, rhonchi, wheezing or stridor.  Cardio: RRR with no MRGs. Brisk peripheral pulses without edema.  Abdomen: Soft, + BS.  Non tender, no guarding, rebound, hernias, masses. Lymphatics: Non tender without lymphadenopathy.  Musculoskeletal: Full ROM, 5/5 strength, Normal gait, mildly tender through deltoid without palpable abnormality, effusion Skin: Warm, dry without rashes, ecchymosis.  Rough, tender lesion, <0.5cm, irregular borders. Neuro: Cranial nerves intact. No cerebellar symptoms.  Psych: Awake and oriented X 3, normal affect, Insight and Judgment appropriate.    Procedure Details   The risks, benefits, indications, potential complications, and alternatives were explained to the patient and informed consent obtained.  Liquid nitrogen was use in a triple freeze and thaw technique. The patient tolerated the procedure well.   Condition: Stable  Complications:  None  Diagnosis: Actinic Keratosis Seb. Keratoses,  irritated  Procedure code:  [id number]  Plan: 1. Patient educated that the area will begin to heal in approximately a week.  2. Warning signs of infection were reviewed.  3. Recommended that the patient use OTC acetaminophen as needed for pain.      Garnet Sierras, NP 10:01 PM [facility]

## 2018-07-11 ENCOUNTER — Encounter: Payer: Self-pay | Admitting: Adult Health Nurse Practitioner

## 2018-07-11 ENCOUNTER — Ambulatory Visit: Payer: BC Managed Care – PPO | Admitting: Adult Health Nurse Practitioner

## 2018-07-11 ENCOUNTER — Other Ambulatory Visit: Payer: Self-pay

## 2018-07-11 VITALS — BP 118/74 | HR 66 | Temp 97.5°F | Ht 67.75 in | Wt 169.2 lb

## 2018-07-11 DIAGNOSIS — Z6826 Body mass index (BMI) 26.0-26.9, adult: Secondary | ICD-10-CM

## 2018-07-11 DIAGNOSIS — Z114 Encounter for screening for human immunodeficiency virus [HIV]: Secondary | ICD-10-CM

## 2018-07-11 DIAGNOSIS — E559 Vitamin D deficiency, unspecified: Secondary | ICD-10-CM | POA: Diagnosis not present

## 2018-07-11 DIAGNOSIS — L570 Actinic keratosis: Secondary | ICD-10-CM

## 2018-07-11 DIAGNOSIS — R7309 Other abnormal glucose: Secondary | ICD-10-CM

## 2018-07-11 DIAGNOSIS — K5190 Ulcerative colitis, unspecified, without complications: Secondary | ICD-10-CM

## 2018-07-11 DIAGNOSIS — N401 Enlarged prostate with lower urinary tract symptoms: Secondary | ICD-10-CM | POA: Diagnosis not present

## 2018-07-11 DIAGNOSIS — E781 Pure hyperglyceridemia: Secondary | ICD-10-CM

## 2018-07-11 DIAGNOSIS — R0989 Other specified symptoms and signs involving the circulatory and respiratory systems: Secondary | ICD-10-CM | POA: Diagnosis not present

## 2018-07-11 DIAGNOSIS — E782 Mixed hyperlipidemia: Secondary | ICD-10-CM

## 2018-07-11 DIAGNOSIS — K219 Gastro-esophageal reflux disease without esophagitis: Secondary | ICD-10-CM

## 2018-07-11 DIAGNOSIS — Z79899 Other long term (current) drug therapy: Secondary | ICD-10-CM

## 2018-07-11 DIAGNOSIS — N138 Other obstructive and reflux uropathy: Secondary | ICD-10-CM

## 2018-07-11 DIAGNOSIS — F419 Anxiety disorder, unspecified: Secondary | ICD-10-CM

## 2018-07-11 DIAGNOSIS — Z72820 Sleep deprivation: Secondary | ICD-10-CM

## 2018-07-11 DIAGNOSIS — R0681 Apnea, not elsewhere classified: Secondary | ICD-10-CM

## 2018-07-11 NOTE — Patient Instructions (Signed)
Ask insurance and pharmacy about shingrix - new vaccine   Can go to [email or web address] for more information  Shingrix Vaccination  Two vaccines are licensed and recommended to prevent shingles in the [place] Zoster vaccine live (ZVL, Zostavax) has been in use since 2006. Recombinant zoster vaccine (RZV, Shingrix), has been in use since 2017 and is recommended by ACIP as the preferred shingles vaccine.  What Everyone Should Know about Shingles Vaccine (Shingrix) One of the Recommended Vaccines by Disease Shingles vaccination is the only way to protect against shingles and postherpetic neuralgia (PHN), the most common complication from shingles. CDC recommends that healthy adults 50 years and older get two doses of the shingles vaccine called Shingrix (recombinant zoster vaccine), separated by 2 to 6 months, to prevent shingles and the complications from the disease. Your doctor or pharmacist can give you Shingrix as a shot in your upper arm. Shingrix provides strong protection against shingles and PHN. Two doses of Shingrix is more than 90% effective at preventing shingles and PHN. Protection stays above 85% for at least the first four years after you get vaccinated. Shingrix is the preferred vaccine, over Zostavax (zoster vaccine live), a shingles vaccine in use since 2006. Zostavax may still be used to prevent shingles in healthy adults 60 years and older. For example, you could use Zostavax if a person is allergic to Shingrix, prefers Zostavax, or requests immediate vaccination and Shingrix is unavailable. Who Should Get Shingrix? Healthy adults 50 years and older should get two doses of Shingrix, separated by 2 to 6 months. You should get Shingrix even if in the past you . had shingles  . received Zostavax  . are not sure if you had chickenpox There is no maximum age for getting Shingrix. If you had shingles in the past, you can get  Shingrix to help prevent future occurrences of the disease. There is no specific length of time that you need to wait after having shingles before you can receive Shingrix, but generally you should make sure the shingles rash has gone away before getting vaccinated. You can get Shingrix whether or not you remember having had chickenpox in the past. Studies show that more than 99% of Americans 40 years and older have had chickenpox, even if they don't remember having the disease. Chickenpox and shingles are related because they are caused by the same virus (varicella zoster virus). After a person recovers from chickenpox, the virus stays dormant (inactive) in the body. It can reactivate years later and cause shingles. If you had Zostavax in the recent past, you should wait at least eight weeks before getting Shingrix. Talk to your healthcare provider to determine the best time to get Shingrix. Shingrix is available in [employer] and pharmacies. To find doctor's offices or pharmacies near you that offer the vaccine, visit HealthMap Vaccine FinderExternal. If you have questions about Shingrix, talk with your healthcare provider. Vaccine for Those 19 Years and Older  Shingrix reduces the risk of shingles and PHN by more than 90% in people 65 and older. CDC recommends the vaccine for healthy adults 31 and older.  Who Should Not Get Shingrix? You should not get Shingrix if you: . have ever had a severe allergic reaction to any component of the vaccine or after a dose of Shingrix  . tested negative for immunity to varicella zoster virus. If you test negative, you should get chickenpox vaccine.  . currently have shingles  . currently are pregnant or breastfeeding.  Women who are pregnant or breastfeeding should wait to get Shingrix.  Marland Kitchen receive specific antiviral drugs (acyclovir, famciclovir, or valacyclovir) 24 hours before vaccination (avoid use of these antiviral drugs for 14 days after vaccination)-  zoster vaccine live only If you have a minor acute (starts suddenly) illness, such as a cold, you may get Shingrix. But if you have a moderate or severe acute illness, you should usually wait until you recover before getting the vaccine. This includes anyone with a temperature of 101.84F or higher. The side effects of the Shingrix are temporary, and usually last 2 to 3 days. While you may experience pain for a few days after getting Shingrix, the pain will be less severe than having shingles and the complications from the disease. How Well Does Shingrix Work? Two doses of Shingrix provides strong protection against shingles and postherpetic neuralgia (PHN), the most common complication of shingles. . In adults 36 to 63 years old who got two doses, Shingrix was 97% effective in preventing shingles; among adults 70 years and older, Shingrix was 91% effective.  . In adults 58 to 63 years old who got two doses, Shingrix was 91% effective in preventing PHN; among adults 70 years and older, Shingrix was 89% effective. Shingrix protection remained high (more than 85%) in people 70 years and older throughout the four years following vaccination. Since your risk of shingles and PHN increases as you get older, it is important to have strong protection against shingles in your older years. Top of Page  What Are the Possible Side Effects of Shingrix? Studies show that Shingrix is safe. The vaccine helps your body create a strong defense against shingles. As a result, you are likely to have temporary side effects from getting the shots. The side effects may affect your ability to do normal daily activities for 2 to 3 days. Most people got a sore arm with mild or moderate pain after getting Shingrix, and some also had redness and swelling where they got the shot. Some people felt tired, had muscle pain, a headache, shivering, fever, stomach pain, or nausea. About 1 out of 6 people who got Shingrix experienced side  effects that prevented them from doing regular activities. Symptoms went away on their own in about 2 to 3 days. Side effects were more common in younger people. You might have a reaction to the first or second dose of Shingrix, or both doses. If you experience side effects, you may choose to take over-the-counter pain medicine such as ibuprofen or acetaminophen. If you experience side effects from Shingrix, you should report them to the Vaccine Adverse Event Reporting System (VAERS). Your doctor might file this report, or you can do it yourself through the VAERS websiteExternal, or by calling [phone]. If you have any questions about side effects from Shingrix, talk with your doctor. The shingles vaccine does not contain thimerosal (a preservative containing mercury). Top of Page  When Should I See a Doctor Because of the Side Effects I Experience From Shingrix? In clinical trials, Shingrix was not associated with serious adverse events. In fact, serious side effects from vaccines are extremely rare. For example, for every 1 million doses of a vaccine given, only one or two people may have a severe allergic reaction. Signs of an allergic reaction happen within minutes or hours after vaccination and include hives, swelling of the face and throat, difficulty breathing, a fast heartbeat, dizziness, or weakness. If you experience these or any other life-threatening symptoms, see a  doctor right away. Shingrix causes a strong response in your immune system, so it may produce short-term side effects more intense than you are used to from other vaccines. These side effects can be uncomfortable, but they are expected and usually go away on their own in 2 or 3 days. Top of Page  How Can I Pay For Shingrix? There are several ways shingles vaccine may be paid for: Medicare . Medicare Part D plans cover the shingles vaccine, but there may be a cost to you depending on your plan. There may be a copay for the  vaccine, or you may need to pay in full then get reimbursed for a certain amount.  . Medicare Part B does not cover the shingles vaccine. Medicaid . Medicaid may or may not cover the vaccine. Contact your insurer to find out. Private health insurance . Many private health insurance plans will cover the vaccine, but there may be a cost to you depending on your plan. Contact your insurer to find out. Vaccine assistance programs . Some pharmaceutical companies provide vaccines to eligible adults who cannot afford them. You may want to check with the vaccine manufacturer, [employer], about Shingrix. If you do not currently have health insurance, learn more about affordable health coverage optionsExternal. To find doctor's offices or pharmacies near you that offer the vaccine, visit HealthMap Vaccine FinderExternal.

## 2018-07-12 LAB — COMPLETE METABOLIC PANEL WITH GFR
AG Ratio: 1.7 (calc) (ref 1.0–2.5)
ALT: 35 U/L (ref 9–46)
AST: 34 U/L (ref 10–35)
Albumin: 4.4 g/dL (ref 3.6–5.1)
Alkaline phosphatase (APISO): 80 U/L (ref 35–144)
BUN: 16 mg/dL (ref 7–25)
CO2: 28 mmol/L (ref 20–32)
Calcium: 9.5 mg/dL (ref 8.6–10.3)
Chloride: 101 mmol/L (ref 98–110)
Creat: 0.85 mg/dL (ref 0.70–1.25)
GFR, Est African American: 108 mL/min/{1.73_m2} (ref >=60)
GFR, Est Non African American: 93 mL/min/{1.73_m2} (ref >=60)
Globulin: 2.6 g/dL (calc) (ref 1.9–3.7)
Glucose, Bld: 98 mg/dL (ref 65–99)
Potassium: 4.3 mmol/L (ref 3.5–5.3)
Sodium: 138 mmol/L (ref 135–146)
Total Bilirubin: 0.5 mg/dL (ref 0.2–1.2)
Total Protein: 7 g/dL (ref 6.1–8.1)

## 2018-07-12 LAB — CBC WITH DIFFERENTIAL/PLATELET
Absolute Monocytes: 616 cells/uL (ref 200–950)
Basophils Absolute: 53 cells/uL (ref 0–200)
Basophils Relative: 0.7 %
Eosinophils Absolute: 137 cells/uL (ref 15–500)
Eosinophils Relative: 1.8 %
HCT: 41.3 % (ref 38.5–50.0)
Hemoglobin: 14 g/dL (ref 13.2–17.1)
Lymphs Abs: 1573 cells/uL (ref 850–3900)
MCH: 31.4 pg (ref 27.0–33.0)
MCHC: 33.9 g/dL (ref 32.0–36.0)
MCV: 92.6 fL (ref 80.0–100.0)
MPV: 9.9 fL (ref 7.5–12.5)
Monocytes Relative: 8.1 %
Neutro Abs: 5221 cells/uL (ref 1500–7800)
Neutrophils Relative %: 68.7 %
Platelets: 301 10*3/uL (ref 140–400)
RBC: 4.46 10*6/uL (ref 4.20–5.80)
RDW: 13.3 % (ref 11.0–15.0)
Total Lymphocyte: 20.7 %
WBC: 7.6 10*3/uL (ref 3.8–10.8)

## 2018-07-12 LAB — MAGNESIUM: Magnesium: 1.8 mg/dL (ref 1.5–2.5)

## 2018-07-12 LAB — LIPID PANEL
Cholesterol: 201 mg/dL — ABNORMAL HIGH (ref <200)
HDL: 74 mg/dL (ref >=40)
Non-HDL Cholesterol (Calc): 127 mg/dL (calc) (ref <130)
Total CHOL/HDL Ratio: 2.7 (calc) (ref <5.0)
Triglycerides: 401 mg/dL — ABNORMAL HIGH (ref <150)

## 2018-07-12 LAB — HEMOGLOBIN A1C
Hgb A1c MFr Bld: 5.5 % of total Hgb (ref <5.7)
Mean Plasma Glucose: 111 (calc)
eAG (mmol/L): 6.2 (calc)

## 2018-07-12 LAB — TSH: TSH: 2.24 mIU/L (ref 0.40–4.50)

## 2018-07-12 LAB — VITAMIN D 25 HYDROXY (VIT D DEFICIENCY, FRACTURES): Vit D, 25-Hydroxy: 49 ng/mL (ref 30–100)

## 2018-07-12 LAB — HIV ANTIBODY (ROUTINE TESTING W REFLEX): HIV 1&2 Ab, 4th Generation: NONREACTIVE

## 2018-07-12 LAB — INSULIN, RANDOM: Insulin: 3.7 u[IU]/mL

## 2018-07-13 ENCOUNTER — Other Ambulatory Visit: Payer: Self-pay | Admitting: Adult Health Nurse Practitioner

## 2018-07-13 MED ORDER — VASCEPA 1 G PO CAPS: 2.0000 | ORAL_CAPSULE | Freq: Two times a day (BID) | ORAL | 0 refills | Status: DC

## 2018-07-17 ENCOUNTER — Other Ambulatory Visit: Payer: Self-pay | Admitting: Internal Medicine

## 2018-07-17 DIAGNOSIS — E782 Mixed hyperlipidemia: Secondary | ICD-10-CM

## 2018-07-17 MED ORDER — VASCEPA 1 G PO CAPS: ORAL_CAPSULE | ORAL | 3 refills | Status: DC

## 2018-07-19 NOTE — Addendum Note (Signed)
Addended byGarnet Sierras A on: 07/19/2018 10:23 PM   Modules accepted: Orders

## 2018-08-16 ENCOUNTER — Telehealth: Payer: Self-pay | Admitting: *Deleted

## 2018-08-16 ENCOUNTER — Other Ambulatory Visit: Payer: Self-pay | Admitting: Internal Medicine

## 2018-08-16 DIAGNOSIS — R197 Diarrhea, unspecified: Secondary | ICD-10-CM

## 2018-08-16 MED ORDER — DIPHENOXYLATE-ATROPINE 2.5-0.025 MG PO TABS: ORAL_TABLET | ORAL | 0 refills | Status: DC

## 2018-08-16 NOTE — Telephone Encounter (Signed)
Patient called and states his wife has shingles, and asked if he has had the vaccine. The patient was informed, he has not had the vaccine and it is available at his pharmacy. He was also informed, the virus is not airborne and can only catch from the fluid from the blisters.

## 2018-09-20 NOTE — Progress Notes (Signed)
Assessment and Plan:  Dillon [name] was seen today for rash.  Diagnoses and all orders for this visit:   Rash and nonspecific skin eruption -     predniSONE (DELTASONE) 20 MG tablet; 1 pill 3 x a day for 3 days, 1 pill 2 x a day x 3 days, 1 pill a day x 5 days with food Continue to monitor  Elevated blood pressure w/o diagnosis of HTN. Controlled today Discussed DASH diet, exercise and monitor at home. Call if greater than 130/80.      Further disposition pending results of labs. Discussed med's effects and SE's.   Over 30 minutes of interview exam, counseling, chart review, and critical decision making was performed. Patient agrees with plan of care.  Future Appointments  Date Time Provider [place]  10/10/2018  3:30 PM [name], NP [facility] None  04/25/2019  9:00 AM [name], MD [facility] None    ------------------------------------------------------------------------------------------------------------------   HPI 63 y.o.male presents for rash.  He reports two weeks ago he was out on the boat and got a rash on the right shoulder. He reports it is itchy and it burns and feels raw.  He started vasepa on 07/14/18 and was concerned this was causing it.  He stopped taking the medication and it seemed to get better.  Was using cortisone but that made it burn.  He stopped and then switched to regular moisturizing lotion, unsure of the brand.  He did not take any other products.    Past Medical History:  Diagnosis Date  . DJD (degenerative joint disease)   . Elevated hemoglobin A1c   . Hyperlipidemia   . Hypertension      Allergies  Allergen Reactions  . Codeine Nausea And Vomiting  . Norco [Hydrocodone-Acetaminophen]   . Penicillins Nausea And Vomiting    Current Outpatient Medications on File Prior to Visit  Medication Sig  . ALPRAZolam (XANAX) 0.5 MG tablet Take 1/2 to 1 tablet 2 to 3 x /day as needed for Anxiety  . B Complex-C (B-COMPLEX WITH VITAMIN  C) tablet Take 1 tablet by mouth daily.  . Cholecalciferol (VITAMIN D PO) Take 200 Units by mouth daily.   . cyclobenzaprine (FLEXERIL) 5 MG tablet Take 1 tablet (5 mg total) by mouth 3 (three) times daily as needed for muscle spasms.  . diphenoxylate-atropine (LOMOTIL) 2.5-0.025 MG tablet Take 1 to 2 tablets up to 4 x /day for Diarrhea (max 8 tablets /day) (Patient taking differently: as needed. Take 1 to 2 tablets up to 4 x /day for Diarrhea (max 8 tablets /day))  . escitalopram (LEXAPRO) 10 MG tablet Take 1 tablet daily for Mood  . Ibuprofen (ADVIL) 200 MG CAPS Take by mouth as needed.  . meloxicam (MOBIC) 7.5 MG tablet TAKE 1 TABLET(7.5 MG) BY MOUTH DAILY (Patient taking differently: as needed. )  . rosuvastatin (CRESTOR) 40 MG tablet Take 1/2 to 1 tablet daily or as directed for Cholesterol  . VASCEPA 1 g CAPS Take 2 capsules 2 x /day with a meal for Triglycerides (Blood Fats) (Patient not taking: Reported on 09/21/2018)   No current facility-administered medications on file prior to visit.     ROS: all negative except above.   Physical Exam:  BP 118/72   Pulse 77   Temp (!) 96.8 F (36 C)   Wt 169 lb (76.7 kg)   SpO2 98%   BMI 25.89 kg/m   General Appearance: Well nourished, in no apparent distress. Eyes: PERRLA, EOMs, conjunctiva no  swelling or erythema Sinuses: No Frontal/maxillary tenderness ENT/Mouth: Ext aud canals clear, TMs without erythema, bulging. No erythema, swelling, or exudate on post pharynx.  Tonsils not swollen or erythematous. Hearing normal.  Neck: Supple, thyroid normal.  Respiratory: Respiratory effort normal, BS equal bilaterally without rales, rhonchi, wheezing or stridor.  Cardio: RRR with no MRGs. Brisk peripheral pulses without edema.  Abdomen: Soft, + BS.  Non tender, no guarding, rebound, hernias, masses. Lymphatics: Non tender without lymphadenopathy.  Musculoskeletal: Full ROM, 5/5 strength, normal gait.  Skin: Warm, dry.  Single erythematous red  raised rash to left shoulder.  Midline clavicle to humerus head, left extending down 10cm.  Single red raised areas to abdomin, right and left side, approximately 5 total.  Neuro: Cranial nerves intact. Normal muscle tone, no cerebellar symptoms. Sensation intact.  Psych: Awake and oriented X 3, normal affect, Insight and Judgment appropriate.     Garnet Sierras, NP 10:00 AM [facility]

## 2018-09-21 ENCOUNTER — Ambulatory Visit: Payer: BC Managed Care – PPO | Admitting: Adult Health

## 2018-09-21 ENCOUNTER — Encounter: Payer: Self-pay | Admitting: Adult Health Nurse Practitioner

## 2018-09-21 ENCOUNTER — Other Ambulatory Visit: Payer: Self-pay

## 2018-09-21 ENCOUNTER — Ambulatory Visit: Payer: BC Managed Care – PPO | Admitting: Adult Health Nurse Practitioner

## 2018-09-21 VITALS — BP 118/72 | HR 77 | Temp 96.8°F | Wt 169.0 lb

## 2018-09-21 DIAGNOSIS — R21 Rash and other nonspecific skin eruption: Secondary | ICD-10-CM | POA: Diagnosis not present

## 2018-09-21 DIAGNOSIS — R030 Elevated blood-pressure reading, without diagnosis of hypertension: Secondary | ICD-10-CM | POA: Diagnosis not present

## 2018-09-21 MED ORDER — PREDNISONE 20 MG PO TABS: ORAL_TABLET | ORAL | 0 refills | Status: AC

## 2018-09-21 NOTE — Patient Instructions (Signed)
   We have sent in Prednisone for you to the pharmacy.  You may take benadryl or Zyrtec for the itching.  These can make you sleepy.  Do not drive or operate machinery until you know how this medication affect you.   You can also use clear aloe or moisturizing lotion to the area to help sooth.   Monitor the rash.  Please contact office if this changes or you have any new or worsening symptoms.

## 2018-09-25 ENCOUNTER — Encounter: Payer: Self-pay | Admitting: Adult Health Nurse Practitioner

## 2018-09-25 ENCOUNTER — Other Ambulatory Visit: Payer: Self-pay | Admitting: Internal Medicine

## 2018-10-08 NOTE — Progress Notes (Signed)
FOLLOW UP  Assessment and Plan:   Hypertension Well controlled off of medications at this time Monitor blood pressure at home; patient to call if consistently greater than 130/80 Continue DASH diet.   Reminder to go to the ER if any CP, SOB, nausea, dizziness, severe HA, changes vision/speech, left arm numbness and tingling and jaw pain.  Cholesterol Total cholesterol essentially at goal with rosuvastatin 40 mg daily;  Triglycerides were atypically elevated last visit, had ? reaction with vascepa, stopped, currently on lifestyle only, can add fenofibrate Continue low cholesterol diet and exercise.  Check lipid panel.   Other abnormal glucose  Recent A1Cs at goal Discussed diet/exercise, weight management  Defer A1C to CPE; check CMP for glucose, monitor weight  BMI 26 Long discussion about weight loss, diet, and exercise Recommended diet heavy in fruits and veggies and low in animal meats, cheeses, and dairy products, appropriate calorie intake Discussed ideal weight for height  Will follow up in 3 months  Vitamin D Def Approaching goal at last visit; he has not increase dose due to intolerance continue supplementation  Defer Vit D level  Ulcerative colitis Doing well, monitors diet Has Lomotil PRN  Continue diet and meds as discussed. Further disposition pending results of labs. Discussed med's effects and SE's.   Over 30 minutes of exam, counseling, chart review, and critical decision making was performed.   Future Appointments  Date Time Provider [place]  04/25/2019  9:00 AM [name], MD [facility] None    ----------------------------------------------------------------------------------------------------------------------  HPI 63 y.o. male  presents for 3 month follow up on hypertension, cholesterol, glucose management, weigh, anxiety and vitamin D deficiency.   Patient has history of Ulcerative colitis, s/p total colectomy in 2000 and  consequently he has ongoing frequent BM's up to 10-15 x/day and consistency ranges from liquid to partially formed, takes lomotil which is beneficial.   He is prescribed lexapro 10 mg daily and PRN xanax for intermittent anxiety; recently he reports taking xanax 0.25 mg 2-3 tabs every month and feels well controlled.   BMI is Body mass index is 26.16 kg/m., he has been working on diet - he admits lapsing during pandemic -  (very physically active job)  [employer] from Last 3 Encounters:  10/10/18 170 lb 12.8 oz (77.5 kg)  09/21/18 169 lb (76.7 kg)  07/11/18 169 lb 3.2 oz (76.7 kg)   He has history of borderline hypertensive values though has remained fairly controlled off of medication; today his BP is BP: 128/82  He does not workout. He denies chest pain, shortness of breath, dizziness.   He is on cholesterol medication (rosuvastatin 40 mg daily, vascepa - ? Had rash, unsure, stopped taking, has never tried Fenofibrate) and denies myalgias. His cholesterol is not at goal. He drinks 2-3 cocktails nightly, does admit to increase ice cream The cholesterol last visit was:   Lab Results  Component Value Date   CHOL 201 (H) 07/11/2018   HDL 74 07/11/2018   [place]  07/11/2018     Comment:     . LDL cholesterol not calculated. Triglyceride levels greater than 400 mg/dL invalidate calculated LDL results. . Reference range: <100 . Desirable range <100 mg/dL for primary prevention;   <70 mg/dL for patients with CHD or diabetic patients  with > or = 2 CHD risk factors. Marland Kitchen LDL-C is now calculated using the Martin-Hopkins  calculation, which is a validated novel method providing  better accuracy than the Friedewald equation in the  estimation  of LDL-C.  Cresenciano Genre et al. Annamaria Helling. 3818;299(37): 2061-2068  (http://education.[employer].com/faq/FAQ164)    TRIG 401 (H) 07/11/2018   CHOLHDL 2.7 07/11/2018    He has been working on diet and exercise for glucose management, and denies foot  ulcerations, increased appetite, nausea, paresthesia of the feet, polydipsia, polyuria, visual disturbances, vomiting and weight loss. Last A1C in the office was:  Lab Results  Component Value Date   HGBA1C 5.5 07/11/2018   Patient is on Vitamin D supplement, taking 5000 IU, hasn't tolerated higher doses.  Lab Results  Component Value Date   VD25OH 49 07/11/2018       Current Medications:  Current Outpatient Medications on File Prior to Visit  Medication Sig  . ALPRAZolam (XANAX) 0.5 MG tablet Take 1/2 to 1 tablet 2 to 3 x /day as needed for Anxiety  . B Complex-C (B-COMPLEX WITH VITAMIN C) tablet Take 1 tablet by mouth daily.  . Cholecalciferol (VITAMIN D PO) Take 200 Units by mouth daily.   . cyclobenzaprine (FLEXERIL) 5 MG tablet Take 1 tablet (5 mg total) by mouth 3 (three) times daily as needed for muscle spasms.  . diphenoxylate-atropine (LOMOTIL) 2.5-0.025 MG tablet Take 1 to 2 tablets up to 4 x /day for Diarrhea (max 8 tablets /day) (Patient taking differently: as needed. Take 1 to 2 tablets up to 4 x /day for Diarrhea (max 8 tablets /day))  . escitalopram (LEXAPRO) 10 MG tablet Take 1 tablet daily for Mood  . Ibuprofen (ADVIL) 200 MG CAPS Take by mouth as needed.  . meloxicam (MOBIC) 7.5 MG tablet TAKE 1 TABLET(7.5 MG) BY MOUTH DAILY (Patient taking differently: as needed. )  . rosuvastatin (CRESTOR) 40 MG tablet Take 1/2 to 1 tablet daily or as directed for Cholesterol   No current facility-administered medications on file prior to visit.      Allergies:  Allergies  Allergen Reactions  . Codeine Nausea And Vomiting  . Norco [Hydrocodone-Acetaminophen]   . Penicillins Nausea And Vomiting     Medical History:  Past Medical History:  Diagnosis Date  . DJD (degenerative joint disease)   . Elevated hemoglobin A1c   . Hyperlipidemia   . Hypertension    Family history- Reviewed and unchanged Social history- Reviewed and unchanged   Review of Systems:  Review of  Systems  Constitutional: Negative for malaise/fatigue and weight loss.  HENT: Negative for hearing loss and tinnitus.   Eyes: Negative for blurred vision and double vision.  Respiratory: Negative for cough, shortness of breath and wheezing.   Cardiovascular: Negative for chest pain, palpitations, orthopnea, claudication and leg swelling.  Gastrointestinal: Positive for diarrhea (chronic following colectomy; unchanged). Negative for abdominal pain, blood in stool, constipation, heartburn, melena, nausea and vomiting.  Genitourinary: Negative.   Musculoskeletal: Negative for back pain, joint pain and myalgias.  Skin: Negative for rash.  Neurological: Negative for dizziness, tingling, sensory change, weakness and headaches.  Endo/Heme/Allergies: Negative for polydipsia.  Psychiatric/Behavioral: Negative.   All other systems reviewed and are negative.    Physical Exam: BP 128/82   Pulse 76   Temp 97.6 F (36.4 C)   Resp 16   Ht 5' 7.75" (1.721 m)   Wt 170 lb 12.8 oz (77.5 kg)   BMI 26.16 kg/m  Wt Readings from Last 3 Encounters:  10/10/18 170 lb 12.8 oz (77.5 kg)  09/21/18 169 lb (76.7 kg)  07/11/18 169 lb 3.2 oz (76.7 kg)   General Appearance: Well nourished, in no apparent distress. Eyes: PERRLA,  EOMs, conjunctiva no swelling or erythema Sinuses: No Frontal/maxillary tenderness ENT/Mouth: Ext aud canals clear, TMs without erythema, bulging. No erythema, swelling, or exudate on post pharynx.  Tonsils not swollen or erythematous. Hearing normal.  Neck: Supple, thyroid normal.  Respiratory: Respiratory effort normal, BS equal bilaterally without rales, rhonchi, wheezing or stridor.  Cardio: RRR with no MRGs. Brisk peripheral pulses without edema.  Abdomen: Soft, + BS.  Non tender, no guarding, rebound, hernias, masses. Lymphatics: Non tender without lymphadenopathy.  Musculoskeletal: Full ROM, 5/5 strength, Normal gait,  Skin: Warm, dry without rashes, lesions, ecchymosis.   Neuro: Cranial nerves intact. No cerebellar symptoms.  Psych: Awake and oriented X 3, normal affect, Insight and Judgment appropriate.    Izora Ribas, NP 4:02 PM [facility]

## 2018-10-10 ENCOUNTER — Other Ambulatory Visit: Payer: Self-pay | Admitting: Adult Health

## 2018-10-10 ENCOUNTER — Other Ambulatory Visit: Payer: Self-pay

## 2018-10-10 ENCOUNTER — Encounter: Payer: Self-pay | Admitting: Adult Health

## 2018-10-10 ENCOUNTER — Ambulatory Visit: Payer: BC Managed Care – PPO | Admitting: Adult Health

## 2018-10-10 VITALS — BP 128/82 | HR 76 | Temp 97.6°F | Resp 16 | Ht 67.75 in | Wt 170.8 lb

## 2018-10-10 DIAGNOSIS — Z6826 Body mass index (BMI) 26.0-26.9, adult: Secondary | ICD-10-CM

## 2018-10-10 DIAGNOSIS — Z87898 Personal history of other specified conditions: Secondary | ICD-10-CM | POA: Diagnosis not present

## 2018-10-10 DIAGNOSIS — K5190 Ulcerative colitis, unspecified, without complications: Secondary | ICD-10-CM

## 2018-10-10 DIAGNOSIS — Z79899 Other long term (current) drug therapy: Secondary | ICD-10-CM

## 2018-10-10 DIAGNOSIS — E559 Vitamin D deficiency, unspecified: Secondary | ICD-10-CM

## 2018-10-10 DIAGNOSIS — R030 Elevated blood-pressure reading, without diagnosis of hypertension: Secondary | ICD-10-CM | POA: Diagnosis not present

## 2018-10-10 DIAGNOSIS — E782 Mixed hyperlipidemia: Secondary | ICD-10-CM | POA: Diagnosis not present

## 2018-10-10 NOTE — Patient Instructions (Addendum)
High Triglycerides Eating Plan Triglycerides are a type of fat in the blood. High levels of triglycerides can increase your risk of heart disease and stroke. If your triglyceride levels are high, choosing the right foods can help lower your triglycerides and keep your heart healthy. Work with your health care provider or a diet and nutrition specialist (dietitian) to develop an eating plan that is right for you. What are tips for following this plan? General guidelines   Lose weight, if you are overweight. For most people, losing 5-10 lbs (2-5 kg) helps lower triglyceride levels. A weight-loss plan may include. ? 30 minutes of exercise at least 5 days a week. ? Reducing the amount of calories, sugar, and fat you eat.  Eat a wide variety of fresh fruits, vegetables, and whole grains. These foods are high in fiber.  Eat foods that contain healthy fats, such as fatty fish, nuts, seeds, and olive oil.  Avoid foods that are high in added sugar, added salt (sodium), saturated fat, and trans fat.  Avoid low-fiber, refined carbohydrates such as white bread, crackers, noodles, and white rice.  Avoid foods with partially hydrogenated oils (trans fats), such as fried foods or stick margarine.  Limit alcohol intake to no more than 1 drink a day for nonpregnant women and 2 drinks a day for men. One drink equals 12 oz of beer, 5 oz of wine, or 1 oz of hard liquor. Your health care provider may recommend that you drink less depending on your overall health. Reading food labels  Check food labels for the amount of saturated fat. Choose foods with no or very little saturated fat.  Check food labels for the amount of trans fat. Choose foods with no trans fat.  Check food labels for the amount of cholesterol. Choose foods low in cholesterol. Ask your dietitian how much cholesterol you should have each day.  Check food labels for the amount of sodium. Choose foods with less than 140 milligrams (mg)  per serving. Shopping  Buy dairy products labeled as nonfat (skim) or low-fat (1%).  Avoid buying processed or prepackaged foods. These are often high in added sugar, sodium, and fat. Cooking  Choose healthy fats when cooking, such as olive oil or canola oil.  Cook foods using lower fat methods, such as baking, broiling, boiling, or grilling.  Make your own sauces, dressings, and marinades when possible, instead of buying them. Store-bought sauces, dressings, and marinades are often high in sodium and sugar. Meal planning  Eat more home-cooked food and less restaurant, buffet, and fast food.  Eat fatty fish at least 2 times each week. Examples of fatty fish include salmon, trout, mackerel, tuna, and herring.  If you eat whole eggs, do not eat more than 3 egg yolks per week. What foods are recommended? The items listed may not be a complete list. Talk with your dietitian about what dietary choices are best for you. Grains Whole wheat or whole grain breads, crackers, cereals, and pasta. Unsweetened oatmeal. Bulgur. Barley. Quinoa. Brown rice. Whole wheat flour tortillas. Vegetables Fresh or frozen vegetables. Low-sodium canned vegetables. Fruits All fresh, canned (in natural juice), or frozen fruits. Meats and other protein foods Skinless chicken or [place]. Ground chicken or [place]. Lean cuts of pork, trimmed of fat. Fish and seafood, especially salmon, trout, and herring. Egg whites. Dried beans, peas, or lentils. Unsalted nuts or seeds. Unsalted canned beans. Natural peanut or almond butter. Dairy Low-fat dairy products. Skim or low-fat (1%) milk.  Reduced fat (2%) and low-sodium cheese. Low-fat ricotta cheese. Low-fat cottage cheese. Plain, low-fat yogurt. Fats and oils Tub margarine without trans fats. Light or reduced-fat mayonnaise. Light or reduced-fat salad dressings. Avocado. Safflower, olive, sunflower, soybean, and canola oils. What foods are not recommended? The items  listed may not be a complete list. Talk with your dietitian about what dietary choices are best for you. Grains White bread. White (regular) pasta. White rice. Cornbread. Bagels. Pastries. Crackers that contain trans fat. Vegetables Creamed or fried vegetables. Vegetables in a cheese sauce. Fruits Sweetened dried fruit. Canned fruit in syrup. Fruit juice. Meats and other protein foods Fatty cuts of meat. Ribs. Chicken wings. Berniece Salines. Sausage. Bologna. Salami. Chitterlings. Fatback. Hot dogs. Bratwurst. Packaged lunch meats. Dairy Whole or reduced-fat (2%) milk. Half-and-half. Cream cheese. Full-fat or sweetened yogurt. Full-fat cheese. Nondairy creamers. Whipped toppings. Processed cheese or cheese spreads. Cheese curds. Beverages Alcohol. Sweetened drinks, such as soda, lemonade, fruit drinks, or punches. Fats and oils Butter. Stick margarine. Lard. Shortening. Ghee. Bacon fat. Tropical oils, such as coconut, palm kernel, or palm oils. Sweets and desserts Corn syrup. Sugars. Honey. Molasses. Candy. Jam and jelly. Syrup. Sweetened cereals. Cookies. Pies. Cakes. Donuts. Muffins. Ice cream. Condiments Store-bought sauces, dressings, and marinades that are high in sugar, such as ketchup and barbecue sauce. Summary  High levels of triglycerides can increase the risk of heart disease and stroke. Choosing the right foods can help lower your triglycerides.  Eat plenty of fresh fruits, vegetables, and whole grains. Choose low-fat dairy and lean meats. Eat fatty fish at least twice a week.  Avoid processed and prepackaged foods with added sugar, sodium, saturated fat, and trans fat.  If you need suggestions or have questions about what types of food are good for you, talk with your health care provider or a dietitian. This information is not intended to replace advice given to you by your health care provider. Make sure you discuss any questions you have with your health care provider. Document  Released: 11/06/2003 Document Revised: 12/31/2016 Document Reviewed: 03/23/2016 Elsevier Patient Education  2020 [place] ethyl capsules What is this medicine? ICOSAPENT ETHYL (eye KOE sa pent eth il) contains essential fats. It is used to treat high triglyceride levels. Diet and lifestyle changes are often used with this drug. This medicine may be used for other purposes; ask your health care provider or pharmacist if you have questions. COMMON BRAND NAME(S): VASCEPA What should I tell my health care provider before I take this medicine? They need to know if you have any of these conditions:  bleeding disorders  liver disease  an unusual or allergic reaction to icosapent ethyl, fish, shellfish, other medicines, foods, dyes, or preservatives  history of irregular heartbeat  pregnant or trying to get pregnant  breast-feeding How should I use this medicine? Take this medicine by mouth with a glass of water. Follow the directions on the prescription label. Take this medicine with food. Do not cut, crush, dissolve, or chew this medicine. Take your medicine at regular intervals. Do not take it more often than directed. Do not stop taking except on your doctor's advice. Talk to your pediatrician regarding the use of this medicine in children. Special care may be needed. Overdosage: If you think you have taken too much of this medicine contact a poison control center or emergency room at once. NOTE: This medicine is only for you. Do not share this medicine with others. What if I  miss a dose? If you miss a dose, take it as soon as you can. If it is almost time for your next dose, take only that dose. Do not take double or extra doses. What may interact with this medicine? This medicine may interact with the following medications:  aspirin and aspirin-like medicines  beta-blockers like metoprolol and propranolol  certain medicines that treat or prevent blood clots  like warfarin, enoxaparin, dalteparin, apixaban, dabigatran, and rivaroxaban  diuretics  male hormones, like estrogens and birth control pills This list may not describe all possible interactions. Give your health care provider a list of all the medicines, herbs, non-prescription drugs, or dietary supplements you use. Also tell them if you smoke, drink alcohol, or use illegal drugs. Some items may interact with your medicine. What should I watch for while using this medicine? You may need blood work done while you are taking this medicine. Follow a good diet and exercise plan. Taking this medicine does not replace a healthy lifestyle. Some foods that have omega-3 fatty acids naturally are fatty fish like albacore tuna, halibut, herring, mackerel, lake trout, salmon, and sardines. If you are scheduled for any medical or dental procedure, tell your healthcare provider that you are taking this medicine. You may need to stop taking this medicine before the procedure. What side effects may I notice from receiving this medicine? Side effects that you should report to your doctor or health care professional as soon as possible:  allergic reactions like skin rash, itching or hives, swelling of the face, lips, or tongue  breathing problems  unusual bleeding or bruising  fast, irregular heartbeat Side effects that usually do not require medical attention (report to your doctor or health care professional if they continue or are bothersome):  muscle or joint pain  sore throat  swelling of the ankles, feet, hands  constipation  Gout This list may not describe all possible side effects. Call your doctor for medical advice about side effects. You may report side effects to FDA at 1-800-FDA-1088. Where should I keep my medicine? Keep out of the reach of children. Store at room temperature between 15 and 30 degrees C (59 and 86 degrees F). Throw away any unused medicine after the expiration date.  NOTE: This sheet is a summary. It may not cover all possible information. If you have questions about this medicine, talk to your doctor, pharmacist, or health care provider.  2020 Elsevier/Gold Standard (2018-01-17 18:35:46)     Fenofibrate capsules What is this medicine? FENOFIBRATE (fen oh FYE brate) capsules can help lower blood fats and cholesterol for people who are at risk of getting inflammation of the pancreas (pancreatitis) from having very high amounts of fats in their blood. This medicine is only for patients whose blood fats are not controlled by diet. This medicine may be used for other purposes; ask your health care provider or pharmacist if you have questions. COMMON BRAND NAME(S): Lipofen What should I tell my health care provider before I take this medicine? They need to know if you have any of these conditions:  gallbladder disease  heart disease  kidney disease  liver disease  an unusual or allergic reaction to fenofibrate, gemfibrozil, other medicines, foods, dyes, or preservatives  pregnant or trying to get pregnant  breast-feeding How should I use this medicine? Take this medicine by mouth with a glass of water. Follow the directions on the prescription label. Take with food. Take your medicine at regular intervals. Do not take  it more often than directed. Do not stop taking except on your doctor's advice. Talk to your pediatrician regarding the use of this medicine in children. Special care may be needed. Overdosage: If you think you have taken too much of this medicine contact a poison control center or emergency room at once. NOTE: This medicine is only for you. Do not share this medicine with others. What if I miss a dose? If you miss a dose, take it as soon as you can. If it is almost time for your next dose, take only that dose. Do not take double or extra doses. What may interact with this medicine? This medicine may interact with the following  medications:  bile acid resins like cholestyramine, colesevelam, and colestipol  certain medicines for cholesterol like atorvastatin, lovastatin, and simvastatin  certain medicines for diabetes, like glipizide or glyburide  certain medicines that suppress the body's immune response like cyclosporine and tacrolimus  certain medicines that treat or prevent blood clots like warfarin  colchicine  ezetimibe  supplements like red yeast rice This list may not describe all possible interactions. Give your health care provider a list of all the medicines, herbs, non-prescription drugs, or dietary supplements you use. Also tell them if you smoke, drink alcohol, or use illegal drugs. Some items may interact with your medicine. What should I watch for while using this medicine? Visit your doctor or healthcare provider for regular checks on your progress. Your blood fat levels and other tests will be measured from time to time. This medicine may cause serious skin reactions. They can happen weeks to months after starting the medicine. Contact your healthcare provider right away if you notice fevers or flu-like symptoms with a rash. The rash may be red or purple and then turn into blisters or peeling of the skin. Or, you might notice a red rash with swelling of the face, lips, or lymph nodes in your neck or under your arms. This medicine is only part of a total cholesterol-lowering program. Your healthcare provider or dietician can suggest a low-cholesterol and low-fat diet that will reduce your risk of getting heart and blood vessel disease. Avoid alcohol and smoking, and keep a proper exercise schedule. If you are diabetic, close regulation and monitoring of your blood sugars can help your blood fat levels. This medicine may change the way your diabetic medicine works and sometimes will require that your dosages be adjusted. Check with your doctor or healthcare provider. This medicine can make you more  sensitive to the sun. Keep out of the sun. If you cannot avoid being in the sun, wear protective clothing and use sunscreen. Do not use sun lamps or tanning beds/booths. You may get drowsy or dizzy. Do not drive, use machinery, or do anything that needs mental alertness until you know how this drug affects you. Do not stand or sit up quickly, especially if you are an older patient. This reduces the risk of dizzy or fainting spells. This medicine may cause a decrease in vitamin B12. You should make sure that you get enough vitamin B12 while you are taking this medicine. Discuss the foods you eat and the vitamins you take with your healthcare provider. What side effects may I notice from receiving this medicine? Side effects that you should report to your doctor or health care professional as soon as possible:  allergic reactions like skin rash, itching or hives, swelling of the face, lips, or tongue  blurred vision  bruising  rash,  fever, and swollen lymph nodes  redness, blistering, peeling, or loosening of the skin, including inside the mouth  signs and symptoms of infection like fever or chills, cough, or sore throat  signs and symptoms of muscle injury like dark urine, trouble passing urine or change in the amount of urine, unusually weak or tired, muscle pain, or side or back pain  stomach pain  yellowing of the eyes or skin Side effects that usually do not require medical attention (report to your doctor or health care professional if they continue or are bothersome):  constipation  diarrhea  dizziness  headache  nausea, vomiting This list may not describe all possible side effects. Call your doctor for medical advice about side effects. You may report side effects to FDA at 1-800-FDA-1088. Where should I keep my medicine? Keep out of the reach of children. Store at room temperature between 15 and 30 degrees C (59 and 86 degrees F). Keep container tightly closed. Throw away  any unused medicine after the expiration date. NOTE: This sheet is a summary. It may not cover all possible information. If you have questions about this medicine, talk to your doctor, pharmacist, or health care provider.  2020 Elsevier/Gold Standard (2018-04-20 14:27:01)

## 2018-10-11 ENCOUNTER — Other Ambulatory Visit: Payer: Self-pay | Admitting: Adult Health

## 2018-10-11 DIAGNOSIS — R748 Abnormal levels of other serum enzymes: Secondary | ICD-10-CM

## 2018-10-11 LAB — LIPID PANEL
Cholesterol: 139 mg/dL (ref <200)
HDL: 89 mg/dL (ref >=40)
LDL Cholesterol (Calc): 33 mg/dL (calc)
Non-HDL Cholesterol (Calc): 50 mg/dL (calc) (ref <130)
Total CHOL/HDL Ratio: 1.6 (calc) (ref <5.0)
Triglycerides: 85 mg/dL (ref <150)

## 2018-10-11 LAB — COMPLETE METABOLIC PANEL WITH GFR
AG Ratio: 1.7 (calc) (ref 1.0–2.5)
ALT: 52 U/L — ABNORMAL HIGH (ref 9–46)
AST: 52 U/L — ABNORMAL HIGH (ref 10–35)
Albumin: 4.3 g/dL (ref 3.6–5.1)
Alkaline phosphatase (APISO): 70 U/L (ref 35–144)
BUN: 15 mg/dL (ref 7–25)
CO2: 26 mmol/L (ref 20–32)
Calcium: 9.1 mg/dL (ref 8.6–10.3)
Chloride: 106 mmol/L (ref 98–110)
Creat: 0.82 mg/dL (ref 0.70–1.25)
GFR, Est African American: 110 mL/min/{1.73_m2} (ref >=60)
GFR, Est Non African American: 95 mL/min/{1.73_m2} (ref >=60)
Globulin: 2.5 g/dL (calc) (ref 1.9–3.7)
Glucose, Bld: 100 mg/dL — ABNORMAL HIGH (ref 65–99)
Potassium: 3.8 mmol/L (ref 3.5–5.3)
Sodium: 140 mmol/L (ref 135–146)
Total Bilirubin: 0.5 mg/dL (ref 0.2–1.2)
Total Protein: 6.8 g/dL (ref 6.1–8.1)

## 2019-01-10 ENCOUNTER — Other Ambulatory Visit: Payer: BC Managed Care – PPO

## 2019-01-10 ENCOUNTER — Other Ambulatory Visit: Payer: Self-pay

## 2019-01-10 DIAGNOSIS — R748 Abnormal levels of other serum enzymes: Secondary | ICD-10-CM

## 2019-01-11 LAB — HEPATIC FUNCTION PANEL
AG Ratio: 1.7 (calc) (ref 1.0–2.5)
ALT: 23 U/L (ref 9–46)
AST: 35 U/L (ref 10–35)
Albumin: 4.4 g/dL (ref 3.6–5.1)
Alkaline phosphatase (APISO): 81 U/L (ref 35–144)
Bilirubin, Direct: 0.1 mg/dL (ref 0.0–0.2)
Globulin: 2.6 g/dL (calc) (ref 1.9–3.7)
Indirect Bilirubin: 0.4 mg/dL (calc) (ref 0.2–1.2)
Total Bilirubin: 0.5 mg/dL (ref 0.2–1.2)
Total Protein: 7 g/dL (ref 6.1–8.1)

## 2019-03-31 ENCOUNTER — Ambulatory Visit: Payer: BC Managed Care – PPO | Attending: Internal Medicine

## 2019-03-31 DIAGNOSIS — Z23 Encounter for immunization: Secondary | ICD-10-CM

## 2019-03-31 NOTE — Progress Notes (Signed)
   Covid-19 Vaccination [facility]  Name:  DONNEY CARAVEO    MRN: [id number] DOB: 1955/10/10  03/31/2019  Mr. Fulop was observed post Covid-19 immunization for 15 minutes without incidence. He was provided with Vaccine Information Sheet and instruction to access the V-Safe system.   Mr. Stapel was instructed to call 911 with any severe reactions post vaccine: Marland Kitchen Difficulty breathing  . Swelling of your face and throat  . A fast heartbeat  . A bad rash all over your body  . Dizziness and weakness    Immunizations Administered    Name Date Dose VIS Date Route   Pfizer COVID-19 Vaccine 03/31/2019 12:10 PM 0.3 mL 01/12/2019 Intramuscular   Manufacturer: [place]   Lot: [id number]   [place]: [id number]

## 2019-04-19 ENCOUNTER — Other Ambulatory Visit: Payer: Self-pay

## 2019-04-19 ENCOUNTER — Encounter: Payer: Self-pay | Admitting: Physician Assistant

## 2019-04-19 ENCOUNTER — Ambulatory Visit: Payer: BC Managed Care – PPO | Admitting: Physician Assistant

## 2019-04-19 VITALS — BP 130/80 | HR 88 | Temp 97.1°F | Wt 170.0 lb

## 2019-04-19 DIAGNOSIS — R197 Diarrhea, unspecified: Secondary | ICD-10-CM | POA: Diagnosis not present

## 2019-04-19 DIAGNOSIS — R109 Unspecified abdominal pain: Secondary | ICD-10-CM | POA: Diagnosis not present

## 2019-04-19 MED ORDER — CIPROFLOXACIN HCL 500 MG PO TABS: 500.0000 mg | ORAL_TABLET | Freq: Two times a day (BID) | ORAL | 0 refills | Status: DC

## 2019-04-19 MED ORDER — DICYCLOMINE HCL 20 MG PO TABS: 20.0000 mg | ORAL_TABLET | Freq: Four times a day (QID) | ORAL | 1 refills | Status: DC | PRN

## 2019-04-19 MED ORDER — METRONIDAZOLE 500 MG PO TABS: 500.0000 mg | ORAL_TABLET | Freq: Three times a day (TID) | ORAL | 0 refills | Status: DC

## 2019-04-19 NOTE — Progress Notes (Signed)
THIS ENCOUNTER IS A VIRTUAL VISIT DUE TO COVID-19 - PATIENT WAS NOT SEEN IN THE OFFICE.  PATIENT HAS CONSENTED TO VIRTUAL VISIT / TELEMEDICINE VISIT   Virtual Visit via telephone Note  I connected with Greg Cutter on 04/19/2019 by telephone.  I verified that I am speaking with the correct person using two identifiers.    I discussed the limitations of evaluation and management by telemedicine and the availability of in person appointments. The patient expressed understanding and agreed to proceed.  History of Present Illness: 64 y.o. WM with history of UC s/p total colectomy in 2000 with reversal in 2001 with subsequent diarrhea on imodium normally calls with diarrhea and low grade temp. Came home from work Monday, felt find, then woke up Tuesday with fever, chills, nausea, severe diarrhea, fells bloated, unable to eat but has been drinking fluids.  Feels very tired.  Has history of diverticulitis in the past.  He has not seen a GI doctor since his surgery.   Patient denies blood in stool, recent antibiotic use, recent travel, significant abdominal pain, unintentional weight loss.  No weight loss but has had some night sweats last few night. .   Patient denies daycare exposure, immunocompromise, recent antibiotic treatment, unusual food and untreated water.  Last Colonoscopy: unknown AB imaging:none  He  has a past surgical history that includes Hand surgery (Right, 1975); Elbow surgery (Right, 2014); Colon surgery (2000, reversal 2001); Elbow ligament reconstruction (Right, July 2014); Elbow ligament reconstruction (Left, Sept 2016); and Hand surgery (Right, 1975).   Medications   Current Outpatient Medications (Cardiovascular):  .  rosuvastatin (CRESTOR) 40 MG tablet, Take 1/2 to 1 tablet daily or as directed for Cholesterol   Current Outpatient Medications (Analgesics):  Marland Kitchen  Ibuprofen (ADVIL) 200 MG CAPS, Take by mouth as needed. .  meloxicam (MOBIC) 7.5 MG tablet, TAKE 1  TABLET(7.5 MG) BY MOUTH DAILY (Patient taking differently: as needed. )   Current Outpatient Medications (Other):  Marland Kitchen  ALPRAZolam (XANAX) 0.5 MG tablet, Take 1/2 to 1 tablet 2 to 3 x /day as needed for Anxiety .  B Complex-C (B-COMPLEX WITH VITAMIN C) tablet, Take 1 tablet by mouth daily. .  Cholecalciferol (VITAMIN D PO), Take 200 Units by mouth daily.  .  cyclobenzaprine (FLEXERIL) 5 MG tablet, Take 1 tablet (5 mg total) by mouth 3 (three) times daily as needed for muscle spasms. .  diphenoxylate-atropine (LOMOTIL) 2.5-0.025 MG tablet, Take 1 to 2 tablets up to 4 x /day for Diarrhea (max 8 tablets /day) (Patient taking differently: as needed. Take 1 to 2 tablets up to 4 x /day for Diarrhea (max 8 tablets /day)) .  escitalopram (LEXAPRO) 10 MG tablet, Take 1 tablet daily for Mood  Problem list He has DJD (degenerative joint disease); History of prediabetes; Ulcerative colitis ([place]); Mixed hyperlipidemia; Elevated blood pressure reading without diagnosis of hypertension; Vitamin D deficiency; Medication management; and BMI 26.0-26.9,adult on their problem list.   Observations/Objective: General Appearance:Well sounding, in no apparent distress.  ENT/Mouth: No hoarseness, No cough for duration of visit.  Respiratory: completing full sentences without distress, without audible wheeze Neuro: Awake and oriented X 3,  Psych:  Insight and Judgment appropriate.   No pain with hopping up and down. No pain with pushing on his own AB.   Assessment and Plan: Jadavion was seen today for acute visit, fever, bloated and diarrhea.  Diagnoses and all orders for this visit:  Diarrhea, unspecified type -     dicyclomine (  BENTYL) 20 MG tablet; Take 1 tablet (20 mg total) by mouth every 6 (six) hours as needed (diarrhea). -     Ambulatory referral to Gastroenterology -     ciprofloxacin (CIPRO) 500 MG tablet; Take 1 tablet (500 mg total) by mouth 2 (two) times daily for 7 days. -     metroNIDAZOLE (FLAGYL)  500 MG tablet; Take 1 tablet (500 mg total) by mouth 3 (three) times daily for 7 days.  Abdominal pain, unspecified abdominal location -     CT ABDOMEN PELVIS W CONTRAST; Future -     Ambulatory referral to Gastroenterology -     ciprofloxacin (CIPRO) 500 MG tablet; Take 1 tablet (500 mg total) by mouth 2 (two) times daily for 7 days.   Close follow up next week with CPE Please go to the ER if you have any severe AB pain, unable to hold down food/water, blood in stool or vomit, chest pain, shortness of breath, or any worsening symptoms.     Follow Up Instructions:  I discussed the assessment and treatment plan with the patient. The patient was provided an opportunity to ask questions and all were answered. The patient agreed with the plan and demonstrated an understanding of the instructions.   The patient was advised to call back or seek an in-person evaluation if the symptoms worsen or if the condition fails to improve as anticipated.  I provided 15 minutes of non-face-to-face time during this encounter.   Vicie Mutters, PA-C

## 2019-04-24 ENCOUNTER — Encounter: Payer: Self-pay | Admitting: Internal Medicine

## 2019-04-24 NOTE — Patient Instructions (Signed)

## 2019-04-24 NOTE — Progress Notes (Signed)
Annual  Screening/Preventative Visit  & Comprehensive Evaluation & Examination     This very nice 64 y.o.  MWM presents for a Screening /Preventative Visit & comprehensive evaluation and management of multiple medical co-morbidities.  Patient has been followed for labile HTN, HLD, Prediabetes and Vitamin D Deficiency.     Patient has Hx/o Ulcerative Colitis and in 2000 underwent total Colectomy and has 10-15 loose BM's daily. Patient was treated 5 days ago empirically  ? SIBO with Cipro & Flagyl for a worsening diarrhea and reports that he is doing much much better .      Patient is followed expectantly for labile HTN circa 2013.  Patient's BP has been controlled and today's BP: 116/74. Patient denies any cardiac symptoms as chest pain, palpitations, shortness of breath, dizziness or ankle swelling.     Patient's hyperlipidemia is controlled with diet and Rosuvastatin. Patient denies myalgias or other medication SE's. Last lipids were at goal:  Lab Results  Component Value Date   CHOL 139 10/10/2018   HDL 89 10/10/2018   LDLCALC 33 10/10/2018   TRIG 85 10/10/2018   CHOLHDL 1.6 10/10/2018       Patient has hx/o prediabetes (A1c 5.7% / 2013 and 5.8% / 2016)and patient denies reactive hypoglycemic symptoms, visual blurring, diabetic polys or paresthesias. Last A1c was Normal & at goal:  Lab Results  Component Value Date   HGBA1C 5.5 07/11/2018        Finally, patient has history of Vitamin D Deficiency ("37" / 2016)and last vitamin D was not at goal (70-100):  Lab Results  Component Value Date   VD25OH 49 07/11/2018    Current Outpatient Medications on File Prior to Visit  Medication Sig  . ALPRAZolam (XANAX) 0.5 MG tablet Take 1/2 to 1 tablet 2 to 3 x /day as needed for Anxiety  . B Complex-C (B-COMPLEX WITH VITAMIN C) tablet Take 1 tablet by mouth daily.  . Cholecalciferol (VITAMIN D PO) Take 200 Units by mouth daily.   . ciprofloxacin (CIPRO) 500 MG tablet Take 1 tablet  (500 mg total) by mouth 2 (two) times daily for 7 days.  . cyclobenzaprine (FLEXERIL) 5 MG tablet Take 1 tablet (5 mg total) by mouth 3 (three) times daily as needed for muscle spasms.  Marland Kitchen dicyclomine (BENTYL) 20 MG tablet Take 1 tablet (20 mg total) by mouth every 6 (six) hours as needed (diarrhea).  . diphenoxylate-atropine (LOMOTIL) 2.5-0.025 MG tablet Take 1 to 2 tablets up to 4 x /day for Diarrhea (max 8 tablets /day) (Patient taking differently: as needed. Take 1 to 2 tablets up to 4 x /day for Diarrhea (max 8 tablets /day))  . escitalopram (LEXAPRO) 10 MG tablet Take 1 tablet daily for Mood  . Ibuprofen (ADVIL) 200 MG CAPS Take by mouth as needed.  . meloxicam (MOBIC) 7.5 MG tablet TAKE 1 TABLET(7.5 MG) BY MOUTH DAILY (Patient taking differently: as needed. )  . metroNIDAZOLE (FLAGYL) 500 MG tablet Take 1 tablet (500 mg total) by mouth 3 (three) times daily for 7 days.   No current facility-administered medications on file prior to visit.   Allergies  Allergen Reactions  . Codeine Nausea And Vomiting  . Norco [Hydrocodone-Acetaminophen]   . Penicillins Nausea And Vomiting   Past Medical History:  Diagnosis Date  . DJD (degenerative joint disease)   . Elevated hemoglobin A1c   . Hyperlipidemia   . Hypertension    Health Maintenance  Topic Date Due  . COLONOSCOPY  Never done  . TETANUS/TDAP  02/07/2023  . INFLUENZA VACCINE  Completed  . Hepatitis C Screening  Completed  . HIV Screening  Completed   Immunization History  Administered Date(s) Administered  . Influenza Inj Mdck Quad Pf 11/13/2018  . Influenza Split 11/02/2013, 11/07/2014  . Influenza-Unspecified 11/23/2015, 11/17/2016  . PFIZER SARS-COV-2 Vaccination 03/31/2019, 04/25/2019  . PPD Test 11/02/2013, 11/07/2014, 02/16/2016, 03/21/2017, 04/05/2018, 04/25/2019  . Pneumococcal Polysaccharide-23 02/16/2016  . Tdap 02/06/2013  . Zoster Recombinat (Shingrix) 11/13/2018, 02/01/2019   Last Colon -   Past Surgical  History:  Procedure Laterality Date  . COLON SURGERY  2000, reversal 2001   coloectomy, with reversal and now a J pouch  . ELBOW LIGAMENT RECONSTRUCTION Right July 2014   Dr Apolonio Schneiders  . ELBOW LIGAMENT RECONSTRUCTION Left Sept 2016   Dr Apolonio Schneiders  . ELBOW SURGERY Right 2014  . HAND SURGERY Right 1975  . HAND SURGERY Right 1975   Family History  Problem Relation Age of Onset  . Diabetes Mother   . Arthritis Mother        Rheumatoid  . Heart disease Father   . Gout Father   . Cancer Father        melanoma  . Cancer Sister        Breast   Social History   Socioeconomic History  . Marital status: Married    Spouse name: Mechele Claude  . Number of children: 1 son & 1 daughter  Occupational History  . Printer for [employer]  Tobacco Use  . Smoking status: Never Smoker  . Smokeless tobacco: Never Used  Substance and Sexual Activity  . Alcohol use: Yes    Comment: occa smixed drink  . Drug use: No  . Sexual activity: Yes   ROS Constitutional: Denies fever, chills, weight loss/gain, headaches, insomnia,  night sweats or change in appetite. Does c/o fatigue. Eyes: Denies redness, blurred vision, diplopia, discharge, itchy or watery eyes.  ENT: Denies discharge, congestion, post nasal drip, epistaxis, sore throat, earache, hearing loss, dental pain, Tinnitus, Vertigo, Sinus pain or snoring.  Cardio: Denies chest pain, palpitations, irregular heartbeat, syncope, dyspnea, diaphoresis, orthopnea, PND, claudication or edema Respiratory: denies cough, dyspnea, DOE, pleurisy, hoarseness, laryngitis or wheezing.  Gastrointestinal: Denies dysphagia, heartburn, reflux, water brash, pain, cramps, nausea, vomiting, bloating, diarrhea, constipation, hematemesis, melena, hematochezia, jaundice or hemorrhoids Genitourinary: Denies dysuria, frequency, urgency, nocturia, hesitancy, discharge, hematuria or flank pain Musculoskeletal: Denies arthralgia, myalgia, stiffness, Jt. Swelling, pain, limp  or strain/sprain. Denies Falls. Skin: Denies puritis, rash, hives, warts, acne, eczema or change in skin lesion Neuro: No weakness, tremor, incoordination, spasms, paresthesia or pain Psychiatric: Denies confusion, memory loss or sensory loss. Denies Depression. Endocrine: Denies change in weight, skin, hair change, nocturia, and paresthesia, diabetic polys, visual blurring or hyper / hypo glycemic episodes.  Heme/Lymph: No excessive bleeding, bruising or enlarged lymph nodes.  Physical Exam  BP 116/74   Pulse 76   Temp (!) 97.5 F (36.4 C)   Resp 16   Ht 5' 7.75" (1.721 m)   Wt 170 lb 6.4 oz (77.3 kg)   BMI 26.10 kg/m   General Appearance: Well nourished and well groomed and in no apparent distress.  Eyes: PERRLA, EOMs, conjunctiva no swelling or erythema, normal fundi and vessels. Sinuses: No frontal/maxillary tenderness ENT/Mouth: EACs patent / TMs  nl. Nares clear without erythema, swelling, mucoid exudates. Oral hygiene is good. No erythema, swelling, or exudate. Tongue normal, non-obstructing. Tonsils not swollen or erythematous. Hearing  normal.  Neck: Supple, thyroid not palpable. No bruits, nodes or JVD. Respiratory: Respiratory effort normal.  BS equal and clear bilateral without rales, rhonci, wheezing or stridor. Cardio: Heart sounds are normal with regular rate and rhythm and no murmurs, rubs or gallops. Peripheral pulses are normal and equal bilaterally without edema. No aortic or femoral bruits. Chest: symmetric with normal excursions and percussion.  Abdomen: Soft, with Nl bowel sounds. Nontender, no guarding, rebound, hernias, masses, or organomegaly.  Lymphatics: Non tender without lymphadenopathy.  Musculoskeletal: Full ROM all peripheral extremities, joint stability, 5/5 strength, and normal gait. Skin: Warm and dry without rashes, lesions, cyanosis, clubbing or  ecchymosis.  Neuro: Cranial nerves intact, reflexes equal bilaterally. Normal muscle tone, no cerebellar  symptoms. Sensation intact.  Pysch: Alert and oriented X 3 with normal affect, insight and judgment appropriate.   Assessment and Plan  1. Annual Preventative/Screening Exam   2. Labile hypertension  - EKG 12-Lead - [place], RETROPERITNL ABD,  LTD - Urinalysis, Routine w reflex microscopic - Microalbumin / creatinine urine ratio - CBC with Differential/Platelet - COMPLETE METABOLIC PANEL WITH GFR - Magnesium - TSH  3. Hyperlipidemia, mixed  - EKG 12-Lead - [place], RETROPERITNL ABD,  LTD - Lipid panel - TSH  4. Abnormal glucose  - EKG 12-Lead - [place], RETROPERITNL ABD,  LTD - Hemoglobin A1c - Insulin, random  5. Vitamin D deficiency  - VITAMIN D 25 Hydroxy  6. Prediabetes  - EKG 12-Lead - [place], RETROPERITNL ABD,  LTD - Hemoglobin A1c - Insulin, random  7. Gastroesophageal reflux disease  - CBC with Differential/Platelet  8. BPH with obstruction/lower urinary tract symptoms  - PSA - Testosterone  9. History of ulcerative colitis   10. Prostate cancer screening  - PSA - Testosterone  11. Screening for colorectal cancer  - POC Hemoccult Bld/Stl  12. Screening for ischemic heart disease  - EKG 12-Lead  13. FHx: heart disease  - EKG 12-Lead - [place], RETROPERITNL ABD,  LTD  14. Screening for AAA (aortic abdominal aneurysm)  - [place], RETROPERITNL ABD,  LTD  15. Fatigue, unspecified type  - Iron,Total/Total Iron Binding Cap - Vitamin B12 - CBC with Differential/Platelet - TSH  16. Medication management  - Urinalysis, Routine w reflex microscopic - Microalbumin / creatinine urine ratio - CBC with Differential/Platelet - COMPLETE METABOLIC PANEL WITH GFR - Magnesium - Lipid panel - TSH - Hemoglobin A1c - Insulin, random - VITAMIN D 25 Hydroxy  17. Screening examination for pulmonary tuberculosis  - PPD       Patient was counseled in prudent diet, weight control to achieve/maintain BMI less than 25, BP monitoring, regular exercise and medications  as discussed.  Discussed med effects and SE's. Routine screening labs and tests as requested with regular follow-up as recommended. Over 40 minutes of exam, counseling, chart review and high complex critical decision making was performed   Kirtland Bouchard, MD

## 2019-04-25 ENCOUNTER — Other Ambulatory Visit: Payer: Self-pay

## 2019-04-25 ENCOUNTER — Ambulatory Visit (INDEPENDENT_AMBULATORY_CARE_PROVIDER_SITE_OTHER): Payer: BC Managed Care – PPO | Admitting: Internal Medicine

## 2019-04-25 ENCOUNTER — Ambulatory Visit: Payer: BC Managed Care – PPO | Attending: Internal Medicine

## 2019-04-25 VITALS — BP 116/74 | HR 76 | Temp 97.5°F | Resp 16 | Ht 67.75 in | Wt 170.4 lb

## 2019-04-25 DIAGNOSIS — N401 Enlarged prostate with lower urinary tract symptoms: Secondary | ICD-10-CM | POA: Diagnosis not present

## 2019-04-25 DIAGNOSIS — Z125 Encounter for screening for malignant neoplasm of prostate: Secondary | ICD-10-CM

## 2019-04-25 DIAGNOSIS — R350 Frequency of micturition: Secondary | ICD-10-CM | POA: Diagnosis not present

## 2019-04-25 DIAGNOSIS — Z0000 Encounter for general adult medical examination without abnormal findings: Secondary | ICD-10-CM

## 2019-04-25 DIAGNOSIS — Z1211 Encounter for screening for malignant neoplasm of colon: Secondary | ICD-10-CM

## 2019-04-25 DIAGNOSIS — N138 Other obstructive and reflux uropathy: Secondary | ICD-10-CM

## 2019-04-25 DIAGNOSIS — I10 Essential (primary) hypertension: Secondary | ICD-10-CM | POA: Diagnosis not present

## 2019-04-25 DIAGNOSIS — Z13220 Encounter for screening for lipoid disorders: Secondary | ICD-10-CM

## 2019-04-25 DIAGNOSIS — R5383 Other fatigue: Secondary | ICD-10-CM

## 2019-04-25 DIAGNOSIS — Z136 Encounter for screening for cardiovascular disorders: Secondary | ICD-10-CM

## 2019-04-25 DIAGNOSIS — Z1389 Encounter for screening for other disorder: Secondary | ICD-10-CM | POA: Diagnosis not present

## 2019-04-25 DIAGNOSIS — Z1329 Encounter for screening for other suspected endocrine disorder: Secondary | ICD-10-CM | POA: Diagnosis not present

## 2019-04-25 DIAGNOSIS — Z130 Encounter for screening for diseases of the blood and blood-forming organs and certain disorders involving the immune mechanism: Secondary | ICD-10-CM

## 2019-04-25 DIAGNOSIS — Z1212 Encounter for screening for malignant neoplasm of rectum: Secondary | ICD-10-CM

## 2019-04-25 DIAGNOSIS — Z111 Encounter for screening for respiratory tuberculosis: Secondary | ICD-10-CM

## 2019-04-25 DIAGNOSIS — R7303 Prediabetes: Secondary | ICD-10-CM

## 2019-04-25 DIAGNOSIS — Z0001 Encounter for general adult medical examination with abnormal findings: Secondary | ICD-10-CM

## 2019-04-25 DIAGNOSIS — Z23 Encounter for immunization: Secondary | ICD-10-CM

## 2019-04-25 DIAGNOSIS — Z8719 Personal history of other diseases of the digestive system: Secondary | ICD-10-CM

## 2019-04-25 DIAGNOSIS — K219 Gastro-esophageal reflux disease without esophagitis: Secondary | ICD-10-CM

## 2019-04-25 DIAGNOSIS — Z79899 Other long term (current) drug therapy: Secondary | ICD-10-CM | POA: Diagnosis not present

## 2019-04-25 DIAGNOSIS — Z131 Encounter for screening for diabetes mellitus: Secondary | ICD-10-CM

## 2019-04-25 DIAGNOSIS — E559 Vitamin D deficiency, unspecified: Secondary | ICD-10-CM | POA: Diagnosis not present

## 2019-04-25 DIAGNOSIS — R0989 Other specified symptoms and signs involving the circulatory and respiratory systems: Secondary | ICD-10-CM | POA: Diagnosis not present

## 2019-04-25 DIAGNOSIS — R7309 Other abnormal glucose: Secondary | ICD-10-CM

## 2019-04-25 DIAGNOSIS — Z8249 Family history of ischemic heart disease and other diseases of the circulatory system: Secondary | ICD-10-CM | POA: Diagnosis not present

## 2019-04-25 DIAGNOSIS — E782 Mixed hyperlipidemia: Secondary | ICD-10-CM

## 2019-04-25 NOTE — Progress Notes (Signed)
   Covid-19 Vaccination Clinic  Name:  Dillon [name]    MRN: [id number] DOB: Feb 05, 1955  04/25/2019  Dillon [name] was observed post Covid-19 immunization for 15 minutes without incident. He was provided with Vaccine Information Sheet and instruction to access the V-Safe system.   Dillon [name] was instructed to call 911 with any severe reactions post vaccine: Marland Kitchen Difficulty breathing  . Swelling of face and throat  . A fast heartbeat  . A bad rash all over body  . Dizziness and weakness   Immunizations Administered    Name Date Dose VIS Date Route   Pfizer COVID-19 Vaccine 04/25/2019 12:14 PM 0.3 mL 01/12/2019 Intramuscular   Manufacturer: [place]   Lot: [id number]   [place]: [id number]

## 2019-04-26 LAB — IRON, TOTAL/TOTAL IRON BINDING CAP
%SAT: 20 % (calc) (ref 20–48)
Iron: 78 ug/dL (ref 50–180)
TIBC: 398 mcg/dL (calc) (ref 250–425)

## 2019-04-26 LAB — URINALYSIS, ROUTINE W REFLEX MICROSCOPIC
Bacteria, UA: NONE SEEN /HPF
Bilirubin Urine: NEGATIVE
Glucose, UA: NEGATIVE
Hgb urine dipstick: NEGATIVE
Hyaline Cast: NONE SEEN /LPF
Ketones, ur: NEGATIVE
Nitrite: NEGATIVE
Protein, ur: NEGATIVE
Specific Gravity, Urine: 1.022 (ref 1.001–1.03)
Squamous Epithelial / HPF: NONE SEEN /HPF (ref <=5)
WBC, UA: NONE SEEN /HPF (ref 0–5)
pH: 6 (ref 5.0–8.0)

## 2019-04-26 LAB — CBC WITH DIFFERENTIAL/PLATELET
Absolute Monocytes: 752 cells/uL (ref 200–950)
Basophils Absolute: 58 cells/uL (ref 0–200)
Basophils Relative: 0.8 %
Eosinophils Absolute: 131 cells/uL (ref 15–500)
Eosinophils Relative: 1.8 %
HCT: 42.6 % (ref 38.5–50.0)
Hemoglobin: 14.4 g/dL (ref 13.2–17.1)
Lymphs Abs: 1935 cells/uL (ref 850–3900)
MCH: 31.4 pg (ref 27.0–33.0)
MCHC: 33.8 g/dL (ref 32.0–36.0)
MCV: 92.8 fL (ref 80.0–100.0)
MPV: 10.4 fL (ref 7.5–12.5)
Monocytes Relative: 10.3 %
Neutro Abs: 4424 cells/uL (ref 1500–7800)
Neutrophils Relative %: 60.6 %
Platelets: 443 10*3/uL — ABNORMAL HIGH (ref 140–400)
RBC: 4.59 10*6/uL (ref 4.20–5.80)
RDW: 13.2 % (ref 11.0–15.0)
Total Lymphocyte: 26.5 %
WBC: 7.3 10*3/uL (ref 3.8–10.8)

## 2019-04-26 LAB — LIPID PANEL
Cholesterol: 153 mg/dL (ref <200)
HDL: 45 mg/dL (ref >=40)
LDL Cholesterol (Calc): 84 mg/dL (calc)
Non-HDL Cholesterol (Calc): 108 mg/dL (calc) (ref <130)
Total CHOL/HDL Ratio: 3.4 (calc) (ref <5.0)
Triglycerides: 139 mg/dL (ref <150)

## 2019-04-26 LAB — MICROALBUMIN / CREATININE URINE RATIO
Creatinine, Urine: 237 mg/dL (ref 20–320)
Microalb Creat Ratio: 1 mcg/mg creat (ref <30)
Microalb, Ur: 0.3 mg/dL

## 2019-04-26 LAB — COMPLETE METABOLIC PANEL WITH GFR
AG Ratio: 2 (calc) (ref 1.0–2.5)
ALT: 40 U/L (ref 9–46)
AST: 25 U/L (ref 10–35)
Albumin: 4.5 g/dL (ref 3.6–5.1)
Alkaline phosphatase (APISO): 64 U/L (ref 35–144)
BUN: 9 mg/dL (ref 7–25)
CO2: 26 mmol/L (ref 20–32)
Calcium: 9.6 mg/dL (ref 8.6–10.3)
Chloride: 101 mmol/L (ref 98–110)
Creat: 0.73 mg/dL (ref 0.70–1.25)
GFR, Est African American: 114 mL/min/{1.73_m2} (ref >=60)
GFR, Est Non African American: 99 mL/min/{1.73_m2} (ref >=60)
Globulin: 2.3 g/dL (calc) (ref 1.9–3.7)
Glucose, Bld: 103 mg/dL — ABNORMAL HIGH (ref 65–99)
Potassium: 4.2 mmol/L (ref 3.5–5.3)
Sodium: 134 mmol/L — ABNORMAL LOW (ref 135–146)
Total Bilirubin: 0.4 mg/dL (ref 0.2–1.2)
Total Protein: 6.8 g/dL (ref 6.1–8.1)

## 2019-04-26 LAB — PSA: PSA: 0.7 ng/mL (ref <=4.0)

## 2019-04-26 LAB — HEMOGLOBIN A1C
Hgb A1c MFr Bld: 5.6 % of total Hgb (ref <5.7)
Mean Plasma Glucose: 114 (calc)
eAG (mmol/L): 6.3 (calc)

## 2019-04-26 LAB — INSULIN, RANDOM: Insulin: 9.5 u[IU]/mL

## 2019-04-26 LAB — TESTOSTERONE: Testosterone: 258 ng/dL (ref 250–827)

## 2019-04-26 LAB — VITAMIN D 25 HYDROXY (VIT D DEFICIENCY, FRACTURES): Vit D, 25-Hydroxy: 45 ng/mL (ref 30–100)

## 2019-04-26 LAB — VITAMIN B12: Vitamin B-12: 1443 pg/mL — ABNORMAL HIGH (ref 200–1100)

## 2019-04-26 LAB — MAGNESIUM: Magnesium: 2.1 mg/dL (ref 1.5–2.5)

## 2019-04-26 LAB — TSH: TSH: 1.22 mIU/L (ref 0.40–4.50)

## 2019-07-27 NOTE — Progress Notes (Signed)
FOLLOW UP  Assessment and Plan:   Hypertension Well controlled off of medications at this time Monitor blood pressure at home; patient to call if consistently greater than 130/80 Continue DASH diet.   Reminder to go to the ER if any CP, SOB, nausea, dizziness, severe HA, changes vision/speech, left arm numbness and tingling and jaw pain.  Cholesterol Total cholesterol at goal ? Off of rosuvastatin last visit -  Continue low cholesterol diet and exercise.  Check lipid panel.   Other abnormal glucose  Recent A1Cs at goal Discussed diet/exercise, weight management  Defer A1C to CPE; check CMP for glucose, monitor weight  BMI 26 Long discussion about weight loss, diet, and exercise Recommended diet heavy in fruits and veggies and low in animal meats, cheeses, and dairy products, appropriate calorie intake Discussed ideal weight for height  Will follow up in 3 months  Vitamin D Def Approaching goal at last visit; he has not increase dose due to intolerance continue supplementation  Defer Vit D level  Ulcerative colitis Doing well, monitors diet Has Lomotil PRN  Anxiety with depression  Patient pref will retry lexapro 10 mg though ? SE, "felt tingly"  Failed several SSRI previously, buspar Consider trying effexor, vraylar, trintellix if lexapro is intolerable  Stress management techniques discussed, increase water, good sleep hygiene discussed, increase exercise, and increase veggies.      Continue diet and meds as discussed. Further disposition pending results of labs. Discussed med's effects and SE's.   Over 30 minutes of exam, counseling, chart review, and critical decision making was performed.   Future Appointments  Date Time Provider [place]  10/30/2019  4:00 PM [name], MD [facility] None  05/01/2020  9:00 AM [name], MD [facility] None     ----------------------------------------------------------------------------------------------------------------------  HPI 64 y.o. male  presents for 3 month follow up on hypertension, cholesterol, glucose management, weigh, anxiety and vitamin D deficiency.   Patient has history of Ulcerative colitis, s/p total colectomy in 2000 and consequently he has ongoing frequent BM's up to 10-15 x/day and consistency ranges from liquid to partially formed, recently completed abx which seems to have improved sx, having 8-10. Hasn't been needing bentyl, takes lomotil PRN on trips.   He was prescribed lexapro 10 mg daily and PRN xanax for intermittent anxiety; recently stopped taking lexapro due to ? SE, "tingly and fuzzy headed" - has had similar with zoloft, celexa though reports lexapro has been best tolerated - would like to retry.  He reports taking xanax 0.25 mg 0-3 tabs/week   BMI is Body mass index is 26.5 kg/m., he has been working on diet - he admits lapsing during pandemic - irregular -  (very physically active job)  [facility] Readings from Last 3 Encounters:  07/30/19 173 lb (78.5 kg)  04/25/19 170 lb 6.4 oz (77.3 kg)  04/19/19 170 lb (77.1 kg)   He has history of borderline hypertensive values though has remained fairly controlled off of medication; does check BPs at home, around 120s/70s, today his BP is BP: 122/68  He does not workout. He denies chest pain, shortness of breath, dizziness.   He is not on cholesterol medication and denies myalgias. His cholesterol is at goal.  The cholesterol last visit was:   Lab Results  Component Value Date   CHOL 153 04/25/2019   HDL 45 04/25/2019   LDLCALC 84 04/25/2019   TRIG 139 04/25/2019   CHOLHDL 3.4 04/25/2019    He has been working on diet and  exercise for glucose management, and denies foot ulcerations, increased appetite, nausea, paresthesia of the feet, polydipsia, polyuria, visual disturbances, vomiting and weight loss. Last A1C in the  office was:  Lab Results  Component Value Date   HGBA1C 5.6 04/25/2019   Patient is on Vitamin D supplement, taking 5000 IU, hasn't tolerated higher doses ,takes as tolerated   Lab Results  Component Value Date   VD25OH 45 04/25/2019       Current Medications:  Current Outpatient Medications on File Prior to Visit  Medication Sig  . ALPRAZolam (XANAX) 0.5 MG tablet Take 1/2 to 1 tablet 2 to 3 x /day as needed for Anxiety  . B Complex-C (B-COMPLEX WITH VITAMIN C) tablet Take 1 tablet by mouth daily.  . Cholecalciferol (VITAMIN D PO) Take 200 Units by mouth daily.   . cyclobenzaprine (FLEXERIL) 5 MG tablet Take 1 tablet (5 mg total) by mouth 3 (three) times daily as needed for muscle spasms. (Patient not taking: Reported on 07/30/2019)  . dicyclomine (BENTYL) 20 MG tablet Take 1 tablet (20 mg total) by mouth every 6 (six) hours as needed (diarrhea). (Patient not taking: Reported on 07/30/2019)  . diphenoxylate-atropine (LOMOTIL) 2.5-0.025 MG tablet Take 1 to 2 tablets up to 4 x /day for Diarrhea (max 8 tablets /day) (Patient not taking: Reported on 07/30/2019)   No current facility-administered medications on file prior to visit.     Allergies:  Allergies  Allergen Reactions  . Codeine Nausea And Vomiting  . Norco [Hydrocodone-Acetaminophen]   . Penicillins Nausea And Vomiting     Medical History:  Past Medical History:  Diagnosis Date  . DJD (degenerative joint disease)   . Elevated hemoglobin A1c   . Hyperlipidemia   . Hypertension    Family history- Reviewed and unchanged Social history- Reviewed and unchanged   Review of Systems:  Review of Systems  Constitutional: Negative for malaise/fatigue and weight loss.  HENT: Negative for hearing loss and tinnitus.   Eyes: Negative for blurred vision and double vision.  Respiratory: Negative for cough, shortness of breath and wheezing.   Cardiovascular: Negative for chest pain, palpitations, orthopnea, claudication and leg  swelling.  Gastrointestinal: Positive for diarrhea (chronic following colectomy; improved). Negative for abdominal pain, blood in stool, constipation, heartburn, melena, nausea and vomiting.  Genitourinary: Negative.   Musculoskeletal: Negative for back pain, joint pain and myalgias.  Skin: Negative for rash.  Neurological: Negative for dizziness, tingling, sensory change, weakness and headaches.  Endo/Heme/Allergies: Negative for polydipsia.  Psychiatric/Behavioral: Positive for depression. Negative for substance abuse and suicidal ideas. The patient is nervous/anxious. The patient does not have insomnia.   All other systems reviewed and are negative.   Physical Exam: BP 122/68   Pulse 85   Temp (!) 97.5 F (36.4 C)   Ht 5' 7.75" (1.721 m)   Wt 173 lb (78.5 kg)   SpO2 98%   BMI 26.50 kg/m  Wt Readings from Last 3 Encounters:  07/30/19 173 lb (78.5 kg)  04/25/19 170 lb 6.4 oz (77.3 kg)  04/19/19 170 lb (77.1 kg)   General Appearance: Well nourished, in no apparent distress. Eyes: PERRLA, EOMs, conjunctiva no swelling or erythema Sinuses: No Frontal/maxillary tenderness ENT/Mouth: Ext aud canals clear, TMs without erythema, bulging. No erythema, swelling, or exudate on post pharynx.  Tonsils not swollen or erythematous. Hearing normal.  Neck: Supple, thyroid normal.  Respiratory: Respiratory effort normal, BS equal bilaterally without rales, rhonchi, wheezing or stridor.  Cardio: RRR with no MRGs.  Brisk peripheral pulses without edema.  Abdomen: Soft, + BS.  Non tender, no guarding, rebound, hernias, masses. Lymphatics: Non tender without lymphadenopathy.  Musculoskeletal: Full ROM, 5/5 strength, Normal gait,  Skin: Warm, dry without rashes, lesions, ecchymosis.  Neuro: Cranial nerves intact. No cerebellar symptoms.  Psych: Awake and oriented X 3, normal affect, Insight and Judgment appropriate.    Izora Ribas, NP 3:50 PM [facility]

## 2019-07-30 ENCOUNTER — Encounter: Payer: Self-pay | Admitting: Adult Health

## 2019-07-30 ENCOUNTER — Ambulatory Visit (INDEPENDENT_AMBULATORY_CARE_PROVIDER_SITE_OTHER): Payer: BC Managed Care – PPO | Admitting: Adult Health

## 2019-07-30 ENCOUNTER — Other Ambulatory Visit: Payer: Self-pay

## 2019-07-30 VITALS — BP 122/68 | HR 85 | Temp 97.5°F | Ht 67.75 in | Wt 173.0 lb

## 2019-07-30 DIAGNOSIS — R030 Elevated blood-pressure reading, without diagnosis of hypertension: Secondary | ICD-10-CM | POA: Diagnosis not present

## 2019-07-30 DIAGNOSIS — E782 Mixed hyperlipidemia: Secondary | ICD-10-CM

## 2019-07-30 DIAGNOSIS — F419 Anxiety disorder, unspecified: Secondary | ICD-10-CM | POA: Diagnosis not present

## 2019-07-30 DIAGNOSIS — Z79899 Other long term (current) drug therapy: Secondary | ICD-10-CM

## 2019-07-30 DIAGNOSIS — Z87898 Personal history of other specified conditions: Secondary | ICD-10-CM

## 2019-07-30 DIAGNOSIS — E559 Vitamin D deficiency, unspecified: Secondary | ICD-10-CM

## 2019-07-30 DIAGNOSIS — K5190 Ulcerative colitis, unspecified, without complications: Secondary | ICD-10-CM

## 2019-07-30 DIAGNOSIS — Z6826 Body mass index (BMI) 26.0-26.9, adult: Secondary | ICD-10-CM

## 2019-07-30 MED ORDER — ESCITALOPRAM OXALATE 10 MG PO TABS: ORAL_TABLET | ORAL | 3 refills | Status: DC

## 2019-07-30 MED ORDER — MELOXICAM 7.5 MG PO TABS: ORAL_TABLET | ORAL | 1 refills | Status: DC

## 2019-07-30 NOTE — Patient Instructions (Signed)
Restart lexapro 10 mg daily   Keep log if start having unusual symptoms  May consider effexor, trintellix, vraylar if having intolerable side effects     Living With Depression Everyone experiences occasional disappointment, sadness, and loss in their lives. When you are feeling down, blue, or sad for at least 2 weeks in a row, it may mean that you have depression. Depression can affect your thoughts and feelings, relationships, daily activities, and physical health. It is caused by changes in the way your brain functions. If you receive a diagnosis of depression, your health care provider will tell you which type of depression you have and what treatment options are available to you. If you are living with depression, there are ways to help you recover from it and also ways to prevent it from coming back. How to cope with lifestyle changes Coping with stress     Stress is your body's reaction to life changes and events, both good and bad. Stressful situations may include:  Getting married.  The death of a spouse.  Losing a job.  Retiring.  Having a baby. Stress can last just a few hours or it can be ongoing. Stress can play a major role in depression, so it is important to learn both how to cope with stress and how to think about it differently. Talk with your health care provider or a counselor if you would like to learn more about stress reduction. He or she may suggest some stress reduction techniques, such as:  Music therapy. This can include creating music or listening to music. Choose music that you enjoy and that inspires you.  Mindfulness-based meditation. This kind of meditation can be done while sitting or walking. It involves being aware of your normal breaths, rather than trying to control your breathing.  Centering prayer. This is a kind of meditation that involves focusing on a spiritual word or phrase. Choose a word, phrase, or sacred image that is meaningful to you  and that brings you peace.  Deep breathing. To do this, expand your stomach and inhale slowly through your nose. Hold your breath for 3-5 seconds, then exhale slowly, allowing your stomach muscles to relax.  Muscle relaxation. This involves intentionally tensing muscles then relaxing them. Choose a stress reduction technique that fits your lifestyle and personality. Stress reduction techniques take time and practice to develop. Set aside 5-15 minutes a day to do them. Therapists can offer training in these techniques. The training may be covered by some insurance plans. Other things you can do to manage stress include:  Keeping a stress diary. This can help you learn what triggers your stress and ways to control your response.  Understanding what your limits are and saying no to requests or events that lead to a schedule that is too full.  Thinking about how you respond to certain situations. You may not be able to control everything, but you can control how you react.  Adding humor to your life by watching funny films or TV shows.  Making time for activities that help you relax and not feeling guilty about spending your time this way.  Medicines Your health care provider may suggest certain medicines if he or she feels that they will help improve your condition. Avoid using alcohol and other substances that may prevent your medicines from working properly (may interact). It is also important to:  Talk with your pharmacist or health care provider about all the medicines that you take, their possible  side effects, and what medicines are safe to take together.  Make it your goal to take part in all treatment decisions (shared decision-making). This includes giving input on the side effects of medicines. It is best if shared decision-making with your health care provider is part of your total treatment plan. If your health care provider prescribes a medicine, you may not notice the full benefits  of it for 4-8 weeks. Most people who are treated for depression need to be on medicine for at least 6-12 months after they feel better. If you are taking medicines as part of your treatment, do not stop taking medicines without first talking to your health care provider. You may need to have the medicine slowly decreased (tapered) over time to decrease the risk of harmful side effects. Relationships Your health care provider may suggest family therapy along with individual therapy and drug therapy. While there may not be family problems that are causing you to feel depressed, it is still important to make sure your family learns as much as they can about your mental health. Having your family's support can help make your treatment successful. How to recognize changes in your condition Everyone has a different response to treatment for depression. Recovery from major depression happens when you have not had signs of major depression for two months. This may mean that you will start to:  Have more interest in doing activities.  Feel less hopeless than you did 2 months ago.  Have more energy.  Overeat less often, or have better or improving appetite.  Have better concentration. Your health care provider will work with you to decide the next steps in your recovery. It is also important to recognize when your condition is getting worse. Watch for these signs:  Having fatigue or low energy.  Eating too much or too little.  Sleeping too much or too little.  Feeling restless, agitated, or hopeless.  Having trouble concentrating or making decisions.  Having unexplained physical complaints.  Feeling irritable, angry, or aggressive. Get help as soon as you or your family members notice these symptoms coming back. How to get support and help from others How to talk with friends and family members about your condition  Talking to friends and family members about your condition can provide you  with one way to get support and guidance. Reach out to trusted friends or family members, explain your symptoms to them, and let them know that you are working with a health care provider to treat your depression. Financial resources Not all insurance plans cover mental health care, so it is important to check with your insurance carrier. If paying for co-pays or counseling services is a problem, search for a local or county mental health care center. They may be able to offer public mental health care services at low or no cost when you are not able to see a private health care provider. If you are taking medicine for depression, you may be able to get the generic form, which may be less expensive. Some makers of prescription medicines also offer help to patients who cannot afford the medicines they need. Follow these instructions at home:   Get the right amount and quality of sleep.  Cut down on using caffeine, tobacco, alcohol, and other potentially harmful substances.  Try to exercise, such as walking or lifting small weights.  Take over-the-counter and prescription medicines only as told by your health care provider.  Eat a healthy diet that includes  plenty of vegetables, fruits, whole grains, low-fat dairy products, and lean protein. Do not eat a lot of foods that are high in solid fats, added sugars, or salt.  Keep all follow-up visits as told by your health care provider. This is important. Contact a health care provider if:  You stop taking your antidepressant medicines, and you have any of these symptoms: ? Nausea. ? Headache. ? Feeling lightheaded. ? Chills and body aches. ? Not being able to sleep (insomnia).  You or your friends and family think your depression is getting worse. Get help right away if:  You have thoughts of hurting yourself or others. If you ever feel like you may hurt yourself or others, or have thoughts about taking your own life, get help right away.  You can go to your nearest emergency department or call:  Your local emergency services (911 in the [place]  A suicide crisis helpline, such as the [facility] at 912-386-1522. This is open 24-hours a day. Summary  If you are living with depression, there are ways to help you recover from it and also ways to prevent it from coming back.  Work with your health care team to create a management plan that includes counseling, stress management techniques, and healthy lifestyle habits. This information is not intended to replace advice given to you by your health care provider. Make sure you discuss any questions you have with your health care provider. Document Revised: 05/12/2018 Document Reviewed: 12/22/2015 Elsevier Patient Education  [place].

## 2019-07-31 ENCOUNTER — Other Ambulatory Visit: Payer: Self-pay | Admitting: Adult Health

## 2019-07-31 LAB — COMPLETE METABOLIC PANEL WITH GFR
AG Ratio: 1.8 (calc) (ref 1.0–2.5)
ALT: 25 U/L (ref 9–46)
AST: 20 U/L (ref 10–35)
Albumin: 4.7 g/dL (ref 3.6–5.1)
Alkaline phosphatase (APISO): 64 U/L (ref 35–144)
BUN: 14 mg/dL (ref 7–25)
CO2: 28 mmol/L (ref 20–32)
Calcium: 9.8 mg/dL (ref 8.6–10.3)
Chloride: 100 mmol/L (ref 98–110)
Creat: 0.87 mg/dL (ref 0.70–1.25)
GFR, Est African American: 106 mL/min/{1.73_m2} (ref >=60)
GFR, Est Non African American: 92 mL/min/{1.73_m2} (ref >=60)
Globulin: 2.6 g/dL (calc) (ref 1.9–3.7)
Glucose, Bld: 104 mg/dL — ABNORMAL HIGH (ref 65–99)
Potassium: 4.1 mmol/L (ref 3.5–5.3)
Sodium: 138 mmol/L (ref 135–146)
Total Bilirubin: 0.5 mg/dL (ref 0.2–1.2)
Total Protein: 7.3 g/dL (ref 6.1–8.1)

## 2019-07-31 LAB — CBC WITH DIFFERENTIAL/PLATELET
Absolute Monocytes: 666 cells/uL (ref 200–950)
Basophils Absolute: 72 cells/uL (ref 0–200)
Basophils Relative: 0.8 %
Eosinophils Absolute: 117 cells/uL (ref 15–500)
Eosinophils Relative: 1.3 %
HCT: 43.9 % (ref 38.5–50.0)
Hemoglobin: 14.6 g/dL (ref 13.2–17.1)
Lymphs Abs: 2052 cells/uL (ref 850–3900)
MCH: 31.1 pg (ref 27.0–33.0)
MCHC: 33.3 g/dL (ref 32.0–36.0)
MCV: 93.4 fL (ref 80.0–100.0)
MPV: 10.5 fL (ref 7.5–12.5)
Monocytes Relative: 7.4 %
Neutro Abs: 6093 cells/uL (ref 1500–7800)
Neutrophils Relative %: 67.7 %
Platelets: 315 10*3/uL (ref 140–400)
RBC: 4.7 10*6/uL (ref 4.20–5.80)
RDW: 13.1 % (ref 11.0–15.0)
Total Lymphocyte: 22.8 %
WBC: 9 10*3/uL (ref 3.8–10.8)

## 2019-07-31 LAB — LIPID PANEL
Cholesterol: 240 mg/dL — ABNORMAL HIGH (ref <200)
HDL: 71 mg/dL (ref >=40)
LDL Cholesterol (Calc): 115 mg/dL (calc) — ABNORMAL HIGH
Non-HDL Cholesterol (Calc): 169 mg/dL (calc) — ABNORMAL HIGH (ref <130)
Total CHOL/HDL Ratio: 3.4 (calc) (ref <5.0)
Triglycerides: 379 mg/dL — ABNORMAL HIGH (ref <150)

## 2019-07-31 LAB — TSH: TSH: 2.11 mIU/L (ref 0.40–4.50)

## 2019-07-31 LAB — MAGNESIUM: Magnesium: 1.7 mg/dL (ref 1.5–2.5)

## 2019-07-31 MED ORDER — ROSUVASTATIN CALCIUM 5 MG PO TABS
ORAL_TABLET | ORAL | 2 refills | Status: DC
Start: 2019-07-31 — End: 2020-05-01

## 2019-10-29 ENCOUNTER — Encounter: Payer: Self-pay | Admitting: Internal Medicine

## 2019-10-29 NOTE — Patient Instructions (Signed)

## 2019-10-29 NOTE — Progress Notes (Signed)
History of Present Illness:       This very nice 64 y.o.  MWM presents for 6  month follow up with HTN, HLD, Pre-Diabetes and Vitamin D Deficiency.  Patient has hx/o  UC s/p total colectomy in 2000 and subsequent  reversal in 2001 with consequent diarrhea.  Patient also relate depresse mood relating no benefit onvarious  other  SSRI agents.        Patient is followed expectantly for labile  HTN (2013)  & BP has been controlled at home. Today's BP is at goal -  114/78. Patient has had no complaints of any cardiac type chest pain, palpitations, dyspnea / orthopnea / PND, dizziness, claudication, or dependent edema.       Hyperlipidemia is controlled with diet &  Rosuvastatin. Patient denies myalgias or other med SE's. Last Lipids were not at goal:  Lab Results  Component Value Date   CHOL 240 (H) 07/30/2019   HDL 71 07/30/2019   LDLCALC 115 (H) 07/30/2019   TRIG 379 (H) 07/30/2019   CHOLHDL 3.4 07/30/2019    Also, the patient has history of PreDiabetes (A1c 5.7% /2013 and 5.8% /2016). Patient denies symptoms of reactive hypoglycemia, diabetic polys, paresthesias or visual blurring.  Last A1c was Normal & at goal:  Lab Results  Component Value Date   HGBA1C 5.6 04/25/2019           Further, the patient also has history of Vitamin D Deficiency ("37" /2016)  and supplements vitamin D without any suspected side-effects. Last vitamin D was still low (goal 70-100):  Lab Results  Component Value Date   VD25OH 45 04/25/2019    Current Outpatient Medications on File Prior to Visit  Medication Sig  . ALPRAZolam (XANAX) 0.5 MG tablet Take 1/2 to 1 tablet 2 to 3 x /day as needed for Anxiety  . B Complex-C tablet Take 1 tablet by mouth daily.  Marland Kitchen VITAMIN D Take 200 Units by mouth daily.   . LOMOTIL 2.5-0.025 MG tablet Take 1 to 2 tablets up to 4 x /day for Diarrhea   . meloxicam  7.5 MG tablet TAKE 1 TAB AS NEEDED FOR PAIN  . rosuvastatin 5 MG tablet Take 1 tab 3 days/week (MWF) for  cholesterol.    Allergies  Allergen Reactions  . Codeine Nausea And Vomiting  . Norco [Hydrocodone-Acetaminophen]   . Penicillins Nausea And Vomiting    PMHx:   Past Medical History:  Diagnosis Date  . DJD (degenerative joint disease)   . Elevated hemoglobin A1c   . Hyperlipidemia   . Hypertension     Immunization History  Administered Date(s) Administered  . Influenza Inj Mdck Quad Pf 11/13/2018  . Influenza Split 11/02/2013, 11/07/2014  . Influenza-Unspecified 11/23/2015, 11/17/2016  . PFIZER SARS-COV-2 Vaccination 03/31/2019, 04/25/2019  . PPD Test 11/02/2013, 11/07/2014, 02/16/2016, 03/21/2017, 04/05/2018, 04/25/2019  . Pneumococcal Polysaccharide-23 02/16/2016  . Tdap 02/06/2013  . Zoster Recombinat (Shingrix) 11/13/2018, 02/01/2019    Past Surgical History:  Procedure Laterality Date  . COLON SURGERY  2000, reversal 2001   coloectomy, with reversal and now a J pouch  . ELBOW LIGAMENT RECONSTRUCTION Right July 2014   Dr Apolonio Schneiders  . ELBOW LIGAMENT RECONSTRUCTION Left Sept 2016   Dr Apolonio Schneiders  . ELBOW SURGERY Right 2014  . HAND SURGERY Right 1975  . HAND SURGERY Right 1975    FHx:    Reviewed / unchanged  SHx:    Reviewed / unchanged  Systems Review:  Constitutional: Denies fever, chills, wt changes, headaches, insomnia, fatigue, night sweats, change in appetite. Eyes: Denies redness, blurred vision, diplopia, discharge, itchy, watery eyes.  ENT: Denies discharge, congestion, post nasal drip, epistaxis, sore throat, earache, hearing loss, dental pain, tinnitus, vertigo, sinus pain, snoring.  CV: Denies chest pain, palpitations, irregular heartbeat, syncope, dyspnea, diaphoresis, orthopnea, PND, claudication or edema. Respiratory: denies cough, dyspnea, DOE, pleurisy, hoarseness, laryngitis, wheezing.  Gastrointestinal: Denies dysphagia, odynophagia, heartburn, reflux, water brash, abdominal pain or cramps, nausea, vomiting, bloating, diarrhea, constipation,  hematemesis, melena, hematochezia  or hemorrhoids. Genitourinary: Denies dysuria, frequency, urgency, nocturia, hesitancy, discharge, hematuria or flank pain. Musculoskeletal: Denies arthralgias, myalgias, stiffness, jt. swelling, pain, limping or strain/sprain.  Skin: Denies pruritus, rash, hives, warts, acne, eczema or change in skin lesion(s). Neuro: No weakness, tremor, incoordination, spasms, paresthesia or pain. Psychiatric: Denies confusion, memory loss or sensory loss. Endo: Denies change in weight, skin or hair change.  Heme/Lymph: No excessive bleeding, bruising or enlarged lymph nodes.  Physical Exam  BP 114/78   Pulse 72   Temp (!) 97 F (36.1 C)   Resp 16   Ht 5' 7.75" (1.721 m)   Wt 171 lb 3.2 oz (77.7 kg)   SpO2 97%   BMI 26.22 kg/m   Appears  well nourished, well groomed  and in no distress.  Eyes: PERRLA, EOMs, conjunctiva no swelling or erythema. Sinuses: No frontal/maxillary tenderness ENT/Mouth: EAC's clear, TM's nl w/o erythema, bulging. Nares clear w/o erythema, swelling, exudates. Oropharynx clear without erythema or exudates. Oral hygiene is good. Tongue normal, non obstructing. Hearing intact.  Neck: Supple. Thyroid not palpable. Car 2+/2+ without bruits, nodes or JVD. Chest: Respirations nl with BS clear & equal w/o rales, rhonchi, wheezing or stridor.  Cor: Heart sounds normal w/ regular rate and rhythm without sig. murmurs, gallops, clicks or rubs. Peripheral pulses normal and equal  without edema.  Abdomen: Soft & bowel sounds normal. Non-tender w/o guarding, rebound, hernias, masses or organomegaly.  Lymphatics: Unremarkable.  Musculoskeletal: Full ROM all peripheral extremities, joint stability, 5/5 strength and normal gait.  Skin: Warm, dry without exposed rashes, lesions or ecchymosis apparent.  Neuro: Cranial nerves intact, reflexes equal bilaterally. Sensory-motor testing grossly intact. Tendon reflexes grossly intact.  Pysch: Alert & oriented x  3.  Insight and judgement nl & appropriate. No ideations.  Assessment and Plan:  1. Labile hypertension  - Continue medication, monitor blood pressure at home.  - Continue DASH diet.  Reminder to go to the ER if any CP,  SOB, nausea, dizziness, severe HA, changes vision/speech.  - CBC with Differential/Platelet - COMPLETE METABOLIC PANEL WITH GFR - Magnesium - TSH  2. Hyperlipidemia, mixed  - Continue diet/meds, exercise,& lifestyle modifications.  - Continue monitor periodic cholesterol/liver & renal functions   - Lipid panel - TSH  3. Abnormal glucose  - Continue diet, exercise  - Lifestyle modifications.  - Monitor appropriate labs.  - Hemoglobin A1c - Insulin, random  4. Vitamin D deficiency  - Continue supplementation.  - VITAMIN D 25 Hydroxy  5. Medication management  - CBC with Differential/Platelet - COMPLETE METABOLIC PANEL WITH GFR - Magnesium - Lipid panel - TSH - Hemoglobin A1c - Insulin, random - VITAMIN D 25 Hydroxy          Discussed  regular exercise, BP monitoring, weight control to achieve/maintain BMI less than 25 and discussed med and SE's. Recommended labs to assess and monitor clinical status with further disposition pending results of labs.  Patient given samples of Trintellix 5 mg x 1 week, and 10 mg x 1 wk and sent Rx for 10 mg  for 3 mo / 90 day supply. Discussed med& side -effects.I discussed the assessment and treatment plan with the patient. The patient was provided an opportunity to ask questions and all were answered. The patient agreed with the plan and demonstrated an understanding of the instructions.  I provided over 30 minutes of exam, counseling, chart review and  complex critical decision making.    Kirtland Bouchard, MD

## 2019-10-30 ENCOUNTER — Other Ambulatory Visit: Payer: Self-pay

## 2019-10-30 ENCOUNTER — Ambulatory Visit (INDEPENDENT_AMBULATORY_CARE_PROVIDER_SITE_OTHER): Payer: BC Managed Care – PPO | Admitting: Internal Medicine

## 2019-10-30 VITALS — BP 114/78 | HR 72 | Temp 97.0°F | Resp 16 | Ht 67.75 in | Wt 171.2 lb

## 2019-10-30 DIAGNOSIS — R0989 Other specified symptoms and signs involving the circulatory and respiratory systems: Secondary | ICD-10-CM

## 2019-10-30 DIAGNOSIS — Z79899 Other long term (current) drug therapy: Secondary | ICD-10-CM | POA: Diagnosis not present

## 2019-10-30 DIAGNOSIS — E559 Vitamin D deficiency, unspecified: Secondary | ICD-10-CM

## 2019-10-30 DIAGNOSIS — E782 Mixed hyperlipidemia: Secondary | ICD-10-CM

## 2019-10-30 DIAGNOSIS — R7309 Other abnormal glucose: Secondary | ICD-10-CM

## 2019-10-30 MED ORDER — VORTIOXETINE HBR 10 MG PO TABS: ORAL_TABLET | ORAL | 1 refills | Status: DC

## 2019-10-31 LAB — CBC WITH DIFFERENTIAL/PLATELET
Absolute Monocytes: 559 cells/uL (ref 200–950)
Basophils Absolute: 41 cells/uL (ref 0–200)
Basophils Relative: 0.5 %
Eosinophils Absolute: 203 cells/uL (ref 15–500)
Eosinophils Relative: 2.5 %
HCT: 40.6 % (ref 38.5–50.0)
Hemoglobin: 13.6 g/dL (ref 13.2–17.1)
Lymphs Abs: 2122 cells/uL (ref 850–3900)
MCH: 30.6 pg (ref 27.0–33.0)
MCHC: 33.5 g/dL (ref 32.0–36.0)
MCV: 91.4 fL (ref 80.0–100.0)
MPV: 10.5 fL (ref 7.5–12.5)
Monocytes Relative: 6.9 %
Neutro Abs: 5176 cells/uL (ref 1500–7800)
Neutrophils Relative %: 63.9 %
Platelets: 298 10*3/uL (ref 140–400)
RBC: 4.44 10*6/uL (ref 4.20–5.80)
RDW: 12.8 % (ref 11.0–15.0)
Total Lymphocyte: 26.2 %
WBC: 8.1 10*3/uL (ref 3.8–10.8)

## 2019-10-31 LAB — COMPLETE METABOLIC PANEL WITH GFR
AG Ratio: 1.8 (calc) (ref 1.0–2.5)
ALT: 23 U/L (ref 9–46)
AST: 17 U/L (ref 10–35)
Albumin: 4.4 g/dL (ref 3.6–5.1)
Alkaline phosphatase (APISO): 66 U/L (ref 35–144)
BUN: 16 mg/dL (ref 7–25)
CO2: 28 mmol/L (ref 20–32)
Calcium: 9.6 mg/dL (ref 8.6–10.3)
Chloride: 102 mmol/L (ref 98–110)
Creat: 0.91 mg/dL (ref 0.70–1.25)
GFR, Est African American: 104 mL/min/{1.73_m2} (ref >=60)
GFR, Est Non African American: 89 mL/min/{1.73_m2} (ref >=60)
Globulin: 2.5 g/dL (calc) (ref 1.9–3.7)
Glucose, Bld: 126 mg/dL — ABNORMAL HIGH (ref 65–99)
Potassium: 4.2 mmol/L (ref 3.5–5.3)
Sodium: 140 mmol/L (ref 135–146)
Total Bilirubin: 0.5 mg/dL (ref 0.2–1.2)
Total Protein: 6.9 g/dL (ref 6.1–8.1)

## 2019-10-31 LAB — HEMOGLOBIN A1C
Hgb A1c MFr Bld: 5.5 % of total Hgb (ref <5.7)
Mean Plasma Glucose: 111 (calc)
eAG (mmol/L): 6.2 (calc)

## 2019-10-31 LAB — LIPID PANEL
Cholesterol: 169 mg/dL (ref <200)
HDL: 63 mg/dL (ref >=40)
LDL Cholesterol (Calc): 73 mg/dL (calc)
Non-HDL Cholesterol (Calc): 106 mg/dL (calc) (ref <130)
Total CHOL/HDL Ratio: 2.7 (calc) (ref <5.0)
Triglycerides: 239 mg/dL — ABNORMAL HIGH (ref <150)

## 2019-10-31 LAB — MAGNESIUM: Magnesium: 1.9 mg/dL (ref 1.5–2.5)

## 2019-10-31 LAB — VITAMIN D 25 HYDROXY (VIT D DEFICIENCY, FRACTURES): Vit D, 25-Hydroxy: 38 ng/mL (ref 30–100)

## 2019-10-31 LAB — INSULIN, RANDOM: Insulin: 35.5 u[IU]/mL — ABNORMAL HIGH

## 2019-10-31 LAB — TSH: TSH: 1.62 mIU/L (ref 0.40–4.50)

## 2019-10-31 NOTE — Progress Notes (Signed)
==========================================================  -   Total Chol = 169 and LDL Chol = 73 - Both  Excellent   - Very low risk for Heart Attack  / Stroke =============================================================  - But  Triglycerides (    239   ) or fats in blood are too high  (goal is less than 150)    - Recommend avoid fried & greasy foods,  sweets / candy,   - Avoid white rice  (brown or wild rice or Quinoa is OK),   - Avoid white potatoes  (sweet potatoes are OK)   - Avoid anything made from white flour  - bagels, doughnuts, rolls, buns, biscuits, white and   wheat breads, pizza crust and traditional  pasta made of white flour & egg white  - (vegetarian pasta or spinach or wheat pasta is OK).    - Multi-grain bread is OK - like multi-grain flat bread or  sandwich thins.   - Avoid alcohol in excess.   - Exercise is also important. ==========================================================  - A1c - Normal - Great - No Diabetes ==========================================================  - Vitamin D = 38 Low (Ideal or goal is between 70-1000  - So recommend 5,000 units more Vit D /day ==========================================================  - All Else - CBC - Kidneys - Electrolytes - Liver  - Magnesium & Thyroid    - all  Normal / OK ====================================================

## 2020-01-28 NOTE — Progress Notes (Signed)
FOLLOW UP  Assessment and Plan:   Hypertension Well controlled off of medications at this time Monitor blood pressure at home; patient to call if consistently greater than 130/80 Continue DASH diet.   Reminder to go to the ER if any CP, SOB, nausea, dizziness, severe HA, changes vision/speech, left arm numbness and tingling and jaw pain.  Cholesterol Total cholesterol at goal  Discussed lifestyle for triglyceride elevation Continue low cholesterol diet and exercise.  Check lipid panel.   Other abnormal glucose  Recent A1Cs at goal Discussed diet/exercise, weight management  Defer A1C to CPE; check CMP for glucose, monitor weight  BMI 26 Long discussion about weight loss, diet, and exercise Recommended diet heavy in fruits and veggies and low in animal meats, cheeses, and dairy products, appropriate calorie intake Discussed ideal weight for height  Will follow up in 3 months  Vitamin D Def Approaching goal at last visit; he has not increase dose due to intolerance continue supplementation  Defer Vit D level  Ulcerative colitis Doing well, monitors diet Has Lomotil PRN  Anxiety with depression  Continue meds; off of daily agent, declines other trial at this time, using xanax sparingly Consider effexor or buspar if needed Stress management techniques discussed, increase water, good sleep hygiene discussed, increase exercise, and increase veggies.      Continue diet and meds as discussed. Further disposition pending results of labs. Discussed med's effects and SE's.   Over 30 minutes of exam, counseling, chart review, and critical decision making was performed.   Future Appointments  Date Time Provider [place]  05/01/2020  9:00 AM [name], MD [facility] None    ----------------------------------------------------------------------------------------------------------------------  HPI 64 y.o. male  presents for 3 month follow up on hypertension,  cholesterol, glucose management, weight, anxiety and vitamin D deficiency.   Patient has history of Ulcerative colitis, s/p total colectomy in 2000 and consequently he has ongoing frequent BM's up to 10-15 x/day and consistency ranges from liquid to partially formed, recently completed abx which seems to have improved sx, having 8-10. Hasn't been needing bentyl, takes lomotil PRN on trips.   He was prescribed trintellix and PRN xanax for intermittent anxiety;  stopped taking lexapro due to ? SE, "tingly and fuzzy headed" - has had similar with zoloft, celexa.  He reports taking xanax 0.25 mg 0-3 tabs/week. He reports has been off of trintellix since the summer, wife felt he was more snappy and agitated on this medication. Otherwise did tolerate fairly well, but states recently mood is table and declines a daily agent.   BMI is Body mass index is 26.19 kg/m., he has been working on diet - he admits lapsing during pandemic - irregular -  (very physically active job)  [facility] Readings from Last 3 Encounters:  01/29/20 171 lb (77.6 kg)  10/30/19 171 lb 3.2 oz (77.7 kg)  07/30/19 173 lb (78.5 kg)   He has history of borderline hypertensive values though has remained fairly controlled off of medication; does check BPs at home, around 120s/70s, today his BP is BP: 110/72  He does not workout. He denies chest pain, shortness of breath, dizziness.   He is on cholesterol medication (rosuvastatin 5 mg MWF) and denies myalgias. His cholesterol is at goal. Trigs remain elevated; he does report 2 alcoholic drinks most evenings. The cholesterol last visit was:   Lab Results  Component Value Date   CHOL 169 10/30/2019   HDL 63 10/30/2019   LDLCALC 73 10/30/2019   TRIG 239 (  H) 10/30/2019   CHOLHDL 2.7 10/30/2019    He has been working on diet and exercise for glucose management (hx of prediabetes), and denies foot ulcerations, increased appetite, nausea, paresthesia of the feet, polydipsia, polyuria, visual  disturbances, vomiting and weight loss. Last A1C in the office was:  Lab Results  Component Value Date   HGBA1C 5.5 10/30/2019   Patient is on Vitamin D supplement, taking 5000 IU, hasn't tolerated higher doses, takes as tolerated   Lab Results  Component Value Date   VD25OH 38 10/30/2019       Current Medications:  Current Outpatient Medications on File Prior to Visit  Medication Sig   ALPRAZolam (XANAX) 0.5 MG tablet Take 1/2 to 1 tablet 2 to 3 x /day as needed for Anxiety   B Complex-C (B-COMPLEX WITH VITAMIN C) tablet Take 1 tablet by mouth daily.   Cholecalciferol (VITAMIN D PO) Take 200 Units by mouth daily.    diphenoxylate-atropine (LOMOTIL) 2.5-0.025 MG tablet Take 1 to 2 tablets up to 4 x /day for Diarrhea (max 8 tablets /day)   meloxicam (MOBIC) 7.5 MG tablet TAKE 1 TABLET(7.5 MG) AS NEEDED FOR BACK PAIN   rosuvastatin (CRESTOR) 5 MG tablet Take 1 tab 3 days/week (MWF) for cholesterol.   vortioxetine HBr (TRINTELLIX) 10 MG TABS tablet Take    1 tablet     Daily      for Mood (Patient not taking: Reported on 01/29/2020)   No current facility-administered medications on file prior to visit.     Allergies:  Allergies  Allergen Reactions   Codeine Nausea And Vomiting   Norco [Hydrocodone-Acetaminophen]    Penicillins Nausea And Vomiting     Medical History:  Past Medical History:  Diagnosis Date   DJD (degenerative joint disease)    Elevated hemoglobin A1c    Hyperlipidemia    Hypertension    Family history- Reviewed and unchanged Social history- Reviewed and unchanged   Review of Systems:  Review of Systems  Constitutional: Negative for malaise/fatigue and weight loss.  HENT: Negative for hearing loss and tinnitus.   Eyes: Negative for blurred vision and double vision.  Respiratory: Negative for cough, shortness of breath and wheezing.   Cardiovascular: Negative for chest pain, palpitations, orthopnea, claudication and leg swelling.   Gastrointestinal: Positive for diarrhea (chronic following colectomy; improved). Negative for abdominal pain, blood in stool, constipation, heartburn, melena, nausea and vomiting.  Genitourinary: Negative.   Musculoskeletal: Negative for back pain, joint pain and myalgias.  Skin: Negative for rash.  Neurological: Negative for dizziness, tingling, sensory change, weakness and headaches.  Endo/Heme/Allergies: Negative for polydipsia.  Psychiatric/Behavioral: Positive for depression. Negative for substance abuse and suicidal ideas. The patient is nervous/anxious. The patient does not have insomnia.   All other systems reviewed and are negative.   Physical Exam: BP 110/72    Pulse 92    Temp (!) 97.1 F (36.2 C)    Resp 16    Ht 5' 7.75" (1.721 m)    Wt 171 lb (77.6 kg)    SpO2 98%    BMI 26.19 kg/m  Wt Readings from Last 3 Encounters:  01/29/20 171 lb (77.6 kg)  10/30/19 171 lb 3.2 oz (77.7 kg)  07/30/19 173 lb (78.5 kg)   General Appearance: Well nourished, in no apparent distress. Eyes: PERRLA, EOMs, conjunctiva no swelling or erythema Sinuses: No Frontal/maxillary tenderness ENT/Mouth: Ext aud canals clear, TMs without erythema, bulging. No erythema, swelling, or exudate on post pharynx.  Tonsils  not swollen or erythematous. Hearing normal.  Neck: Supple, thyroid normal.  Respiratory: Respiratory effort normal, BS equal bilaterally without rales, rhonchi, wheezing or stridor.  Cardio: RRR with no MRGs. Brisk peripheral pulses without edema.  Abdomen: Soft, + BS.  Non tender, no guarding, rebound, hernias, masses. Lymphatics: Non tender without lymphadenopathy.  Musculoskeletal: Full ROM, 5/5 strength, Normal gait,  Skin: Warm, dry without rashes, lesions, ecchymosis.  Neuro: Cranial nerves intact. No cerebellar symptoms.  Psych: Awake and oriented X 3, normal affect, Insight and Judgment appropriate.    Izora Ribas, NP 10:01 AM [facility]

## 2020-01-29 ENCOUNTER — Encounter: Payer: Self-pay | Admitting: Adult Health

## 2020-01-29 ENCOUNTER — Ambulatory Visit (INDEPENDENT_AMBULATORY_CARE_PROVIDER_SITE_OTHER): Payer: BC Managed Care – PPO | Admitting: Adult Health

## 2020-01-29 ENCOUNTER — Other Ambulatory Visit: Payer: Self-pay

## 2020-01-29 VITALS — BP 110/72 | HR 92 | Temp 97.1°F | Resp 16 | Ht 67.75 in | Wt 171.0 lb

## 2020-01-29 DIAGNOSIS — E559 Vitamin D deficiency, unspecified: Secondary | ICD-10-CM

## 2020-01-29 DIAGNOSIS — Z6826 Body mass index (BMI) 26.0-26.9, adult: Secondary | ICD-10-CM | POA: Diagnosis not present

## 2020-01-29 DIAGNOSIS — F419 Anxiety disorder, unspecified: Secondary | ICD-10-CM | POA: Diagnosis not present

## 2020-01-29 DIAGNOSIS — E782 Mixed hyperlipidemia: Secondary | ICD-10-CM

## 2020-01-29 DIAGNOSIS — Z79899 Other long term (current) drug therapy: Secondary | ICD-10-CM | POA: Diagnosis not present

## 2020-01-29 DIAGNOSIS — K5190 Ulcerative colitis, unspecified, without complications: Secondary | ICD-10-CM

## 2020-01-29 DIAGNOSIS — Z87898 Personal history of other specified conditions: Secondary | ICD-10-CM

## 2020-01-29 MED ORDER — ALPRAZOLAM 0.5 MG PO TABS: ORAL_TABLET | ORAL | 0 refills | Status: DC

## 2020-01-29 NOTE — Patient Instructions (Addendum)
"fiber fueled" - Will bulsiewicz     High Triglycerides Eating Plan Triglycerides are a type of fat in the blood. High levels of triglycerides can increase your risk of heart disease and stroke. If your triglyceride levels are high, choosing the right foods can help lower your triglycerides and keep your heart healthy. Work with your health care provider or a diet and nutrition specialist (dietitian) to develop an eating plan that is right for you. What are tips for following this plan? General guidelines   Lose weight, if you are overweight. For most people, losing 5-10 lbs (2-5 kg) helps lower triglyceride levels. A weight-loss plan may include. ? 30 minutes of exercise at least 5 days a week. ? Reducing the amount of calories, sugar, and fat you eat.  Eat a wide variety of fresh fruits, vegetables, and whole grains. These foods are high in fiber.  Eat foods that contain healthy fats, such as fatty fish, nuts, seeds, and olive oil.  Avoid foods that are high in added sugar, added salt (sodium), saturated fat, and trans fat.  Avoid low-fiber, refined carbohydrates such as white bread, crackers, noodles, and white rice.  Avoid foods with partially hydrogenated oils (trans fats), such as fried foods or stick margarine.  Limit alcohol intake to no more than 1 drink a day for nonpregnant women and 2 drinks a day for men. One drink equals 12 oz of beer, 5 oz of wine, or 1 oz of hard liquor. Your health care provider may recommend that you drink less depending on your overall health. Reading food labels  Check food labels for the amount of saturated fat. Choose foods with no or very little saturated fat.  Check food labels for the amount of trans fat. Choose foods with no trans fat.  Check food labels for the amount of cholesterol. Choose foods low in cholesterol. Ask your dietitian how much cholesterol you should have each day.  Check food labels for the amount of sodium. Choose foods  with less than 140 milligrams (mg) per serving. Shopping  Buy dairy products labeled as nonfat (skim) or low-fat (1%).  Avoid buying processed or prepackaged foods. These are often high in added sugar, sodium, and fat. Cooking  Choose healthy fats when cooking, such as olive oil or canola oil.  Cook foods using lower fat methods, such as baking, broiling, boiling, or grilling.  Make your own sauces, dressings, and marinades when possible, instead of buying them. Store-bought sauces, dressings, and marinades are often high in sodium and sugar. Meal planning  Eat more home-cooked food and less restaurant, buffet, and fast food.  Eat fatty fish at least 2 times each week. Examples of fatty fish include salmon, trout, mackerel, tuna, and herring.  If you eat whole eggs, do not eat more than 3 egg yolks per week. What foods are recommended? The items listed may not be a complete list. Talk with your dietitian about what dietary choices are best for you. Grains Whole wheat or whole grain breads, crackers, cereals, and pasta. Unsweetened oatmeal. Bulgur. Barley. Quinoa. Brown rice. Whole wheat flour tortillas. Vegetables Fresh or frozen vegetables. Low-sodium canned vegetables. Fruits All fresh, canned (in natural juice), or frozen fruits. Meats and other protein foods Skinless chicken or [place]. Ground chicken or [place]. Lean cuts of pork, trimmed of fat. Fish and seafood, especially salmon, trout, and herring. Egg whites. Dried beans, peas, or lentils. Unsalted nuts or seeds. Unsalted canned beans. Natural peanut or almond butter. Dairy Low-fat  dairy products. Skim or low-fat (1%) milk. Reduced fat (2%) and low-sodium cheese. Low-fat ricotta cheese. Low-fat cottage cheese. Plain, low-fat yogurt. Fats and oils Tub margarine without trans fats. Light or reduced-fat mayonnaise. Light or reduced-fat salad dressings. Avocado. Safflower, olive, sunflower, soybean, and canola oils. What foods  are not recommended? The items listed may not be a complete list. Talk with your dietitian about what dietary choices are best for you. Grains White bread. White (regular) pasta. White rice. Cornbread. Bagels. Pastries. Crackers that contain trans fat. Vegetables Creamed or fried vegetables. Vegetables in a cheese sauce. Fruits Sweetened dried fruit. Canned fruit in syrup. Fruit juice. Meats and other protein foods Fatty cuts of meat. Ribs. Chicken wings. Berniece Salines. Sausage. Bologna. Salami. Chitterlings. Fatback. Hot dogs. Bratwurst. Packaged lunch meats. Dairy Whole or reduced-fat (2%) milk. Half-and-half. Cream cheese. Full-fat or sweetened yogurt. Full-fat cheese. Nondairy creamers. Whipped toppings. Processed cheese or cheese spreads. Cheese curds. Beverages Alcohol. Sweetened drinks, such as soda, lemonade, fruit drinks, or punches. Fats and oils Butter. Stick margarine. Lard. Shortening. Ghee. Bacon fat. Tropical oils, such as coconut, palm kernel, or palm oils. Sweets and desserts Corn syrup. Sugars. Honey. Molasses. Candy. Jam and jelly. Syrup. Sweetened cereals. Cookies. Pies. Cakes. Donuts. Muffins. Ice cream. Condiments Store-bought sauces, dressings, and marinades that are high in sugar, such as ketchup and barbecue sauce. Summary  High levels of triglycerides can increase the risk of heart disease and stroke. Choosing the right foods can help lower your triglycerides.  Eat plenty of fresh fruits, vegetables, and whole grains. Choose low-fat dairy and lean meats. Eat fatty fish at least twice a week.  Avoid processed and prepackaged foods with added sugar, sodium, saturated fat, and trans fat.  If you need suggestions or have questions about what types of food are good for you, talk with your health care provider or a dietitian. This information is not intended to replace advice given to you by your health care provider. Make sure you discuss any questions you have with your  health care provider. Document Revised: 12/31/2016 Document Reviewed: 03/23/2016 Elsevier Patient Education  2020 [employer].

## 2020-01-30 ENCOUNTER — Other Ambulatory Visit: Payer: Self-pay | Admitting: Adult Health

## 2020-01-30 LAB — CBC WITH DIFFERENTIAL/PLATELET
Absolute Monocytes: 383 cells/uL (ref 200–950)
Basophils Absolute: 49 cells/uL (ref 0–200)
Basophils Relative: 0.9 %
Eosinophils Absolute: 162 cells/uL (ref 15–500)
Eosinophils Relative: 3 %
HCT: 43.9 % (ref 38.5–50.0)
Hemoglobin: 14.9 g/dL (ref 13.2–17.1)
Lymphs Abs: 1717 cells/uL (ref 850–3900)
MCH: 30.7 pg (ref 27.0–33.0)
MCHC: 33.9 g/dL (ref 32.0–36.0)
MCV: 90.3 fL (ref 80.0–100.0)
MPV: 10 fL (ref 7.5–12.5)
Monocytes Relative: 7.1 %
Neutro Abs: 3089 cells/uL (ref 1500–7800)
Neutrophils Relative %: 57.2 %
Platelets: 277 10*3/uL (ref 140–400)
RBC: 4.86 10*6/uL (ref 4.20–5.80)
RDW: 14.7 % (ref 11.0–15.0)
Total Lymphocyte: 31.8 %
WBC: 5.4 10*3/uL (ref 3.8–10.8)

## 2020-01-30 LAB — COMPLETE METABOLIC PANEL WITH GFR
AG Ratio: 1.7 (calc) (ref 1.0–2.5)
ALT: 22 U/L (ref 9–46)
AST: 23 U/L (ref 10–35)
Albumin: 4.4 g/dL (ref 3.6–5.1)
Alkaline phosphatase (APISO): 75 U/L (ref 35–144)
BUN: 17 mg/dL (ref 7–25)
CO2: 28 mmol/L (ref 20–32)
Calcium: 9.4 mg/dL (ref 8.6–10.3)
Chloride: 104 mmol/L (ref 98–110)
Creat: 0.89 mg/dL (ref 0.70–1.25)
GFR, Est African American: 105 mL/min/{1.73_m2} (ref >=60)
GFR, Est Non African American: 90 mL/min/{1.73_m2} (ref >=60)
Globulin: 2.6 g/dL (calc) (ref 1.9–3.7)
Glucose, Bld: 92 mg/dL (ref 65–99)
Potassium: 4.4 mmol/L (ref 3.5–5.3)
Sodium: 142 mmol/L (ref 135–146)
Total Bilirubin: 0.5 mg/dL (ref 0.2–1.2)
Total Protein: 7 g/dL (ref 6.1–8.1)

## 2020-01-30 LAB — LIPID PANEL
Cholesterol: 190 mg/dL (ref <200)
HDL: 71 mg/dL (ref >=40)
LDL Cholesterol (Calc): 82 mg/dL (calc)
Non-HDL Cholesterol (Calc): 119 mg/dL (calc) (ref <130)
Total CHOL/HDL Ratio: 2.7 (calc) (ref <5.0)
Triglycerides: 305 mg/dL — ABNORMAL HIGH (ref <150)

## 2020-01-30 LAB — MAGNESIUM: Magnesium: 2 mg/dL (ref 1.5–2.5)

## 2020-01-30 LAB — TSH: TSH: 1.72 mIU/L (ref 0.40–4.50)

## 2020-01-30 NOTE — Progress Notes (Signed)
Patient is aware of lab results and instructions. -e welch

## 2020-02-07 ENCOUNTER — Ambulatory Visit: Payer: Self-pay

## 2020-02-07 ENCOUNTER — Other Ambulatory Visit: Payer: Self-pay

## 2020-02-07 ENCOUNTER — Other Ambulatory Visit: Payer: Self-pay | Admitting: Family Medicine

## 2020-02-07 DIAGNOSIS — M25531 Pain in right wrist: Secondary | ICD-10-CM

## 2020-02-07 DIAGNOSIS — M25532 Pain in left wrist: Secondary | ICD-10-CM

## 2020-04-30 ENCOUNTER — Encounter: Payer: Self-pay | Admitting: Internal Medicine

## 2020-04-30 NOTE — Progress Notes (Signed)
Annual  Screening/Preventative Visit  & Comprehensive Evaluation & Examination                                             This very nice 65 y.o. MWM presents for a Screening /Preventative Visit & comprehensive evaluation and management of multiple medical co-morbidities.  Patient has been followed for labile HTN, HLD, Prediabetes and Vitamin D Deficiency.                                      Patient has Hx/o Ulcerative Colitis underwent Total Colectomy in 2000 and has 8-10 loose BM's daily.       HTN predates circa 2013 Patient's BP has been controlled at home.  Today's BP is at goal -  126/72. Patient denies any cardiac symptoms as chest pain, palpitations, shortness of breath, dizziness or ankle swelling.      Patient's hyperlipidemia is controlled with diet and medications. Patient denies myalgias or other medication SE's. Last lipids were at goal except elevated Trig's:  Lab Results  Component Value Date   CHOL 190 01/29/2020   HDL 71 01/29/2020   LDLCALC 82 01/29/2020   TRIG 305 (H) 01/29/2020   CHOLHDL 2.7 01/29/2020        Patient has hx/o prediabetes  (A1c 5.7% /2013 and 5.8% /2016)and patient denies reactive hypoglycemic symptoms, visual blurring, diabetic polys or paresthesias. Last A1c was normal & at goal:  Lab Results  Component Value Date   HGBA1C 5.5 10/30/2019        Finally, patient has history of Vitamin D Deficiency ("37" /2016) and last vitamin D was still low.   Lab Results  Component Value Date   VD25OH 38 10/30/2019    Current Outpatient Medications on File Prior to Visit  Medication Sig  . ALPRAZolam  0.5 MG  Take 1/2 to 1 tablet 2 to 3 x /day as needed for Anxiety  . B Complex-C tablet Take 1 tablet  daily.  Marland Kitchen VITAMIN D  Take 200 Units  daily.   . Lomotil 2.5-0.025 MG  Take 1 -2 tabs up to 4 x /day for Diarrhea (max 8 tablets /day)  . meloxicam 7.5 MG  TAKE 1 TABLETAS NEEDED FOR BACK PAIN  . rosuvastatin 5 MG  Take 1 tab 3 days/week  (MWF)     Allergies  Allergen Reactions  . Codeine Nausea And Vomiting  . Norco [Hydrocodone-Acetaminophen]   . Penicillins Nausea And Vomiting    Past Medical History:  Diagnosis Date  . DJD (degenerative joint disease)   . Elevated hemoglobin A1c   . Hyperlipidemia   . Hypertension     Health Maintenance  Topic Date Due  . COLONOSCOPY Never done  . INFLUENZA VACCINE  09/02/2019  . COVID-19 Vaccine (4 - Booster for Pfizer series) 06/23/2020  . TETANUS/TDAP  02/07/2023  . Hepatitis C Screening  Completed  . HIV Screening  Completed  . HPV VACCINES  Aged Out   Last Colon -   Past Surgical History:  Procedure Laterality Date  . COLON SURGERY  2000, reversal 2001   coloectomy, with reversal and now a J pouch  . ELBOW LIGAMENT RECONSTRUCTION Right July 2014   Dr Apolonio Schneiders  . ELBOW LIGAMENT RECONSTRUCTION Left Sept 2016  Dr Apolonio Schneiders  . ELBOW SURGERY Right 2014  . HAND SURGERY Right 1975  . HAND SURGERY Right 1975    Family History  Problem Relation Age of Onset  . Diabetes Mother   . Arthritis Mother        Rheumatoid  . Heart disease Father   . Gout Father   . Cancer Father        melanoma  . Cancer Sister        Breast    Social History   Socioeconomic History  . Marital status: Married    Spouse name: Mechele Claude  . Number of children: 1 son died of a Drug OD & a 2sd son has been followed at drug Rehab  & 1 daughter  Occupational History  .  Printer for [employer]    Tobacco Use  . Smoking status: Never Smoker  . Smokeless tobacco: Never Used  Substance and Sexual Activity  . Alcohol use: Yes    Comment: occa smixed drink  . Drug use: No  . Sexual activity: Yes    ROS Constitutional: Denies fever, chills, weight loss/gain, headaches, insomnia,  night sweats or change in appetite. Does c/o fatigue. Eyes: Denies redness, blurred vision, diplopia, discharge, itchy or watery eyes.  ENT: Denies discharge, congestion, post nasal drip,  epistaxis, sore throat, earache, hearing loss, dental pain, Tinnitus, Vertigo, Sinus pain or snoring.  Cardio: Denies chest pain, palpitations, irregular heartbeat, syncope, dyspnea, diaphoresis, orthopnea, PND, claudication or edema Respiratory: denies cough, dyspnea, DOE, pleurisy, hoarseness, laryngitis or wheezing.  Gastrointestinal: Denies dysphagia, heartburn, reflux, water brash, pain, cramps, nausea, vomiting, bloating, diarrhea, constipation, hematemesis, melena, hematochezia, jaundice or hemorrhoids Genitourinary: Denies dysuria, frequency,  discharge, hematuria or flank pain. Has urgency, nocturia x 2-3 & occasional hesitancy. Musculoskeletal: Denies arthralgia, myalgia, stiffness, Jt. Swelling, pain, limp or strain/sprain. Denies Falls. Skin: Denies puritis, rash, hives, warts, acne, eczema or change in skin lesion Neuro: No weakness, tremor, incoordination, spasms, paresthesia or pain Psychiatric: Denies confusion, memory loss or sensory loss. Denies Depression. Endocrine: Denies change in weight, skin, hair change, nocturia, and paresthesia, diabetic polys, visual blurring or hyper / hypo glycemic episodes.  Heme/Lymph: No excessive bleeding, bruising or enlarged lymph nodes.  Physical Exam  BP 126/72   Pulse 76   Temp (!) 97.3 F (36.3 C)   Resp 16   Ht 5' 7"  (1.702 m)   Wt 170 lb 6.4 oz (77.3 kg)   SpO2 99%   BMI 26.69 kg/m   General Appearance: Well nourished and well groomed and in no apparent distress.  Eyes: PERRLA, EOMs, conjunctiva no swelling or erythema, normal fundi and vessels. Sinuses: No frontal/maxillary tenderness ENT/Mouth: EACs patent / TMs  nl. Nares clear without erythema, swelling, mucoid exudates. Oral hygiene is good. No erythema, swelling, or exudate. Tongue normal, non-obstructing. Tonsils not swollen or erythematous. Hearing normal.  Neck: Supple, thyroid not palpable. No bruits, nodes or JVD. Respiratory: Respiratory effort normal.  BS equal and  clear bilateral without rales, rhonci, wheezing or stridor. Cardio: Heart sounds are normal with regular rate and rhythm and no murmurs, rubs or gallops. Peripheral pulses are normal and equal bilaterally without edema. No aortic or femoral bruits. Chest: symmetric with normal excursions and percussion.  Abdomen: Soft, with Nl bowel sounds. Nontender, no guarding, rebound, hernias, masses, or organomegaly.  Lymphatics: Non tender without lymphadenopathy.  Musculoskeletal: Full ROM all peripheral extremities, joint stability, 5/5 strength, and normal gait. Skin: Warm and dry without rashes, lesions, cyanosis,  clubbing or  ecchymosis.  Neuro: Cranial nerves intact, reflexes equal bilaterally. Normal muscle tone, no cerebellar symptoms. Sensation intact.  Pysch: Alert and oriented x 3 with normal affect, insight and judgment appropriate.   Assessment and Plan  1. Annual Preventative/Screening Exam    2. Labile hypertension  - [place], RETROPERITNL ABD,  LTD - Urinalysis, Routine w reflex microscopic - Microalbumin / creatinine urine ratio - COMPLETE METABOLIC PANEL WITH GFR - Magnesium - TSH - EKG 12-Lead  3. Hyperlipidemia, mixed  - [place], RETROPERITNL ABD,  LTD - Lipid panel - TSH - EKG 12-Lead  4. Abnormal glucose  - [place], RETROPERITNL ABD,  LTD - Hemoglobin A1c - Insulin, random - EKG 12-Lead  5. Vitamin D deficiency  - VITAMIN D 25 Hydroxy  6. Gastroesophageal reflux disease   7. BPH with obstruction/lower urinary tract symptoms  - PSA  8. Prostate cancer screening  - PSA  9. History of ulcerative colitis  - Sample bottle Colestipol 1 gm # 120 - try take 1 bid for chronic Diarrhea   10. Screening examination for pulmonary tuberculosis  - TB Skin Test  11. Screening for colorectal cancer  - POC Hemoccult Bld/Stl   12. Screening for ischemic heart disease  - EKG 12-Lead  13. FHx: heart disease  - [place], RETROPERITNL ABD,  LTD - EKG 12-Lead  14. Screening  for AAA (aortic abdominal aneurysm)  - [place], RETROPERITNL ABD,  LTD  15. Fatigue  - Iron,Total/Total Iron Binding Cap - Vitamin B12 - Testosterone - CBC with Differential/Platelet - TSH  16. Medication management  - Urinalysis, Routine w reflex microscopic - Microalbumin / creatinine urine ratio - CBC with Differential/Platelet - COMPLETE METABOLIC PANEL WITH GFR - Magnesium - Lipid panel - TSH - Hemoglobin A1c - Insulin, random - VITAMIN D 25 Hydroxy         Patient was counseled in prudent diet, weight control to achieve/maintain BMI less than 25, BP monitoring, regular exercise and medications as discussed.  Discussed med effects and SE's.  Recommended try Tumeric & Cinnamon for pain in hands/fingers.  Rx Cymbalta for his mood & chronic pain in hands. Routine screening labs and tests as requested with regular follow-up as recommended. Over 40 minutes of exam, counseling, chart review and high complex critical decision making was performed   Kirtland Bouchard, MD

## 2020-04-30 NOTE — Patient Instructions (Signed)

## 2020-05-01 ENCOUNTER — Ambulatory Visit (INDEPENDENT_AMBULATORY_CARE_PROVIDER_SITE_OTHER): Payer: BC Managed Care – PPO | Admitting: Internal Medicine

## 2020-05-01 ENCOUNTER — Encounter: Payer: Self-pay | Admitting: Internal Medicine

## 2020-05-01 ENCOUNTER — Other Ambulatory Visit: Payer: Self-pay

## 2020-05-01 ENCOUNTER — Other Ambulatory Visit: Payer: Self-pay | Admitting: *Deleted

## 2020-05-01 VITALS — BP 126/72 | HR 76 | Temp 97.3°F | Resp 16 | Ht 67.0 in | Wt 170.4 lb

## 2020-05-01 DIAGNOSIS — R5383 Other fatigue: Secondary | ICD-10-CM

## 2020-05-01 DIAGNOSIS — Z136 Encounter for screening for cardiovascular disorders: Secondary | ICD-10-CM

## 2020-05-01 DIAGNOSIS — R350 Frequency of micturition: Secondary | ICD-10-CM

## 2020-05-01 DIAGNOSIS — Z8249 Family history of ischemic heart disease and other diseases of the circulatory system: Secondary | ICD-10-CM | POA: Diagnosis not present

## 2020-05-01 DIAGNOSIS — Z125 Encounter for screening for malignant neoplasm of prostate: Secondary | ICD-10-CM

## 2020-05-01 DIAGNOSIS — Z1211 Encounter for screening for malignant neoplasm of colon: Secondary | ICD-10-CM

## 2020-05-01 DIAGNOSIS — R0989 Other specified symptoms and signs involving the circulatory and respiratory systems: Secondary | ICD-10-CM

## 2020-05-01 DIAGNOSIS — E782 Mixed hyperlipidemia: Secondary | ICD-10-CM

## 2020-05-01 DIAGNOSIS — E559 Vitamin D deficiency, unspecified: Secondary | ICD-10-CM

## 2020-05-01 DIAGNOSIS — Z1389 Encounter for screening for other disorder: Secondary | ICD-10-CM | POA: Diagnosis not present

## 2020-05-01 DIAGNOSIS — Z130 Encounter for screening for diseases of the blood and blood-forming organs and certain disorders involving the immune mechanism: Secondary | ICD-10-CM

## 2020-05-01 DIAGNOSIS — F419 Anxiety disorder, unspecified: Secondary | ICD-10-CM

## 2020-05-01 DIAGNOSIS — R7309 Other abnormal glucose: Secondary | ICD-10-CM

## 2020-05-01 DIAGNOSIS — N138 Other obstructive and reflux uropathy: Secondary | ICD-10-CM

## 2020-05-01 DIAGNOSIS — N401 Enlarged prostate with lower urinary tract symptoms: Secondary | ICD-10-CM

## 2020-05-01 DIAGNOSIS — Z1329 Encounter for screening for other suspected endocrine disorder: Secondary | ICD-10-CM | POA: Diagnosis not present

## 2020-05-01 DIAGNOSIS — Z13220 Encounter for screening for lipoid disorders: Secondary | ICD-10-CM

## 2020-05-01 DIAGNOSIS — G894 Chronic pain syndrome: Secondary | ICD-10-CM

## 2020-05-01 DIAGNOSIS — Z8719 Personal history of other diseases of the digestive system: Secondary | ICD-10-CM

## 2020-05-01 DIAGNOSIS — K219 Gastro-esophageal reflux disease without esophagitis: Secondary | ICD-10-CM

## 2020-05-01 DIAGNOSIS — Z111 Encounter for screening for respiratory tuberculosis: Secondary | ICD-10-CM

## 2020-05-01 DIAGNOSIS — Z0000 Encounter for general adult medical examination without abnormal findings: Secondary | ICD-10-CM | POA: Diagnosis not present

## 2020-05-01 DIAGNOSIS — Z79899 Other long term (current) drug therapy: Secondary | ICD-10-CM | POA: Diagnosis not present

## 2020-05-01 DIAGNOSIS — Z0001 Encounter for general adult medical examination with abnormal findings: Secondary | ICD-10-CM

## 2020-05-01 DIAGNOSIS — Z131 Encounter for screening for diabetes mellitus: Secondary | ICD-10-CM | POA: Diagnosis not present

## 2020-05-01 MED ORDER — ROSUVASTATIN CALCIUM 5 MG PO TABS: ORAL_TABLET | ORAL | 2 refills | Status: DC

## 2020-05-01 MED ORDER — DULOXETINE HCL 60 MG PO CPEP: ORAL_CAPSULE | ORAL | 1 refills | Status: DC

## 2020-05-01 NOTE — Progress Notes (Signed)
AortaScan < 3 cm. Within normal limits, per Dr Melford Aase.

## 2020-05-02 LAB — COMPLETE METABOLIC PANEL WITH GFR
AG Ratio: 1.8 (calc) (ref 1.0–2.5)
ALT: 41 U/L (ref 9–46)
AST: 25 U/L (ref 10–35)
Albumin: 4.6 g/dL (ref 3.6–5.1)
Alkaline phosphatase (APISO): 70 U/L (ref 35–144)
BUN: 9 mg/dL (ref 7–25)
CO2: 26 mmol/L (ref 20–32)
Calcium: 9.7 mg/dL (ref 8.6–10.3)
Chloride: 103 mmol/L (ref 98–110)
Creat: 0.87 mg/dL (ref 0.70–1.25)
GFR, Est African American: 106 mL/min/{1.73_m2} (ref >=60)
GFR, Est Non African American: 91 mL/min/{1.73_m2} (ref >=60)
Globulin: 2.6 g/dL (calc) (ref 1.9–3.7)
Glucose, Bld: 91 mg/dL (ref 65–99)
Potassium: 4.4 mmol/L (ref 3.5–5.3)
Sodium: 140 mmol/L (ref 135–146)
Total Bilirubin: 0.4 mg/dL (ref 0.2–1.2)
Total Protein: 7.2 g/dL (ref 6.1–8.1)

## 2020-05-02 LAB — IRON, TOTAL/TOTAL IRON BINDING CAP
%SAT: 14 % (calc) — ABNORMAL LOW (ref 20–48)
Iron: 60 ug/dL (ref 50–180)
TIBC: 423 mcg/dL (calc) (ref 250–425)

## 2020-05-02 LAB — LIPID PANEL
Cholesterol: 176 mg/dL (ref <200)
HDL: 49 mg/dL (ref >=40)
LDL Cholesterol (Calc): 101 mg/dL (calc) — ABNORMAL HIGH
Non-HDL Cholesterol (Calc): 127 mg/dL (calc) (ref <130)
Total CHOL/HDL Ratio: 3.6 (calc) (ref <5.0)
Triglycerides: 164 mg/dL — ABNORMAL HIGH (ref <150)

## 2020-05-02 LAB — MICROALBUMIN / CREATININE URINE RATIO
Creatinine, Urine: 69 mg/dL (ref 20–320)
Microalb Creat Ratio: 3 mcg/mg creat (ref <30)
Microalb, Ur: 0.2 mg/dL

## 2020-05-02 LAB — CBC WITH DIFFERENTIAL/PLATELET
Absolute Monocytes: 569 cells/uL (ref 200–950)
Basophils Absolute: 58 cells/uL (ref 0–200)
Basophils Relative: 0.8 %
Eosinophils Absolute: 219 cells/uL (ref 15–500)
Eosinophils Relative: 3 %
HCT: 44.7 % (ref 38.5–50.0)
Hemoglobin: 14.5 g/dL (ref 13.2–17.1)
Lymphs Abs: 2037 cells/uL (ref 850–3900)
MCH: 30.8 pg (ref 27.0–33.0)
MCHC: 32.4 g/dL (ref 32.0–36.0)
MCV: 94.9 fL (ref 80.0–100.0)
MPV: 10.1 fL (ref 7.5–12.5)
Monocytes Relative: 7.8 %
Neutro Abs: 4417 cells/uL (ref 1500–7800)
Neutrophils Relative %: 60.5 %
Platelets: 472 10*3/uL — ABNORMAL HIGH (ref 140–400)
RBC: 4.71 10*6/uL (ref 4.20–5.80)
RDW: 13.1 % (ref 11.0–15.0)
Total Lymphocyte: 27.9 %
WBC: 7.3 10*3/uL (ref 3.8–10.8)

## 2020-05-02 LAB — URINALYSIS, ROUTINE W REFLEX MICROSCOPIC
Bilirubin Urine: NEGATIVE
Glucose, UA: NEGATIVE
Hgb urine dipstick: NEGATIVE
Ketones, ur: NEGATIVE
Leukocytes,Ua: NEGATIVE
Nitrite: NEGATIVE
Protein, ur: NEGATIVE
Specific Gravity, Urine: 1.007 (ref 1.001–1.03)
pH: <=5 (ref 5.0–8.0)

## 2020-05-02 LAB — INSULIN, RANDOM: Insulin: 6.6 u[IU]/mL

## 2020-05-02 LAB — VITAMIN D 25 HYDROXY (VIT D DEFICIENCY, FRACTURES): Vit D, 25-Hydroxy: 43 ng/mL (ref 30–100)

## 2020-05-02 LAB — TSH: TSH: 2.05 mIU/L (ref 0.40–4.50)

## 2020-05-02 LAB — HEMOGLOBIN A1C
Hgb A1c MFr Bld: 5.7 % of total Hgb — ABNORMAL HIGH (ref <5.7)
Mean Plasma Glucose: 117 mg/dL
eAG (mmol/L): 6.5 mmol/L

## 2020-05-02 LAB — TESTOSTERONE: Testosterone: 360 ng/dL (ref 250–827)

## 2020-05-02 LAB — PSA: PSA: 0.96 ng/mL (ref <=4.0)

## 2020-05-02 LAB — VITAMIN B12: Vitamin B-12: 664 pg/mL (ref 200–1100)

## 2020-05-02 LAB — MAGNESIUM: Magnesium: 2.3 mg/dL (ref 1.5–2.5)

## 2020-05-02 NOTE — Progress Notes (Signed)
============================================================ ============================================================  -    Iron Level is Low  -  RTecommend take an OTC Iron Supplement  & Eat     More Veggies with Iron as Carrots, Beets , all leafy green veggies as   Spinach, Collards,  Turnip - Mustard or Mixed Greens, Kale,   Asparagus,Broccoli, Brussel Sprouts, Green Beans / peas,   Soybeans, Lentils, Sweet Potatoes ============================================================ ============================================================  -  Vitamin B12 Level is Normal  / OK  ============================================================ ============================================================  -  PSA is Low - Great   ============================================================ ============================================================  -  Testosterone - Normal  ============================================================ ============================================================  -  Total Chol = 176 -  Excellent   - Very low risk for Heart Attack  / Stroke ========================================================  - But , LDL Chol = 101 is borderline       (Ideal or Goal is less than 70  !  )   - So . . . . - Recommend a stricter  low cholesterol diet   - Cholesterol only comes from animal sources  - ie. meat, dairy, egg yolks  - Eat all the vegetables you want.  - Avoid meat, especially red meat - Beef AND Pork .  - Avoid cheese & dairy - milk & ice cream.     - Cheese is the most concentrated form of trans-fats which  is the worst thing to clog up our arteries.   - Veggie cheese is OK which can be found in the fresh  produce section at [employer] or [employer] or [employer] ============================================================ ============================================================  -  A1c = 5.7% - - New - Borderline elevate Sugar -  Will continue to monitor closely , So   - Avoid Sweets, Candy & White Stuff   - White Rice, White Potatoes, White Flour - Breads &  Pasta ============================================================ ============================================================  - Vitamin D = 43 - Low - Vitamin D goal is between 70-100.   - Please INCREASE your          Vitamin D      to 5,000 units      EVERY  day   - It is very important as a natural anti-inflammatory and helping the  immune system protect against viral infections, like the Covid-19    helping hair, skin, and nails, as well as reducing stroke and  heart attack risk.   - It helps your bones and helps with mood.  - It also decreases numerous cancer risks so please  take it as directed.   - Low Vit D is associated with a 200-300% higher risk for  CANCER   and 200-300% higher risk for HEART   ATTACK  &  STROKE.    - It is also associated with higher death rate at younger ages,   autoimmune diseases like Rheumatoid arthritis, Lupus,  Multiple Sclerosis.     - Also many other serious conditions, like depression, Alzheimer's  Dementia, infertility, muscle aches, fatigue, fibromyalgia   - just to name a few. ============================================================ ============================================================  - All Else - CBC - Kidneys - U/A - Electrolytes - Liver - Magnesium & Thyroid    - all  Normal / OK ===========================================================   - Very Good Labs - Keep up the great Work  ! ============================================================ ============================================================             -

## 2020-05-05 LAB — TB SKIN TEST
Induration: 0 mm
TB Skin Test: NEGATIVE

## 2020-10-22 ENCOUNTER — Ambulatory Visit: Payer: BC Managed Care – PPO | Admitting: Adult Health

## 2020-10-22 ENCOUNTER — Other Ambulatory Visit: Payer: Self-pay

## 2020-10-22 ENCOUNTER — Encounter: Payer: Self-pay | Admitting: Adult Health

## 2020-10-22 VITALS — BP 114/64 | HR 78 | Temp 97.5°F | Wt 175.0 lb

## 2020-10-22 DIAGNOSIS — R7309 Other abnormal glucose: Secondary | ICD-10-CM

## 2020-10-22 DIAGNOSIS — Z6827 Body mass index (BMI) 27.0-27.9, adult: Secondary | ICD-10-CM

## 2020-10-22 DIAGNOSIS — Z79899 Other long term (current) drug therapy: Secondary | ICD-10-CM

## 2020-10-22 DIAGNOSIS — F4321 Adjustment disorder with depressed mood: Secondary | ICD-10-CM

## 2020-10-22 DIAGNOSIS — K5190 Ulcerative colitis, unspecified, without complications: Secondary | ICD-10-CM

## 2020-10-22 DIAGNOSIS — E559 Vitamin D deficiency, unspecified: Secondary | ICD-10-CM | POA: Diagnosis not present

## 2020-10-22 DIAGNOSIS — R030 Elevated blood-pressure reading, without diagnosis of hypertension: Secondary | ICD-10-CM | POA: Diagnosis not present

## 2020-10-22 DIAGNOSIS — F411 Generalized anxiety disorder: Secondary | ICD-10-CM

## 2020-10-22 DIAGNOSIS — E782 Mixed hyperlipidemia: Secondary | ICD-10-CM

## 2020-10-22 MED ORDER — VENLAFAXINE HCL ER 75 MG PO CP24
ORAL_CAPSULE | ORAL | 0 refills | Status: DC
Start: 2020-10-22 — End: 2021-01-06

## 2020-10-22 MED ORDER — BUSPIRONE HCL 5 MG PO TABS: ORAL_TABLET | ORAL | 0 refills | Status: DC

## 2020-10-22 NOTE — Patient Instructions (Addendum)
Check on your insurance card - see if any therapists with cognitive behavioral therapy.     Generalized Anxiety Disorder, Adult Generalized anxiety disorder (GAD) is a mental health condition. Unlike normal worries, anxiety related to GAD is not triggered by a specific event. These worries do not fade or get better with time. GAD interferes with relationships, work, and school. GAD symptoms can vary from mild to severe. People with severe GAD can have intense waves of anxiety with physical symptoms that are similar to panic attacks. What are the causes? The exact cause of GAD is not known, but the following are believed to have an impact: Differences in natural brain chemicals. Genes passed down from parents to children. Differences in the way threats are perceived. Development during childhood. Personality. What increases the risk? The following factors may make you more likely to develop this condition: Being male. Having a family history of anxiety disorders. Being very shy. Experiencing very stressful life events, such as the death of a loved one. Having a very stressful family environment. What are the signs or symptoms? People with GAD often worry excessively about many things in their lives, such as their health and family. Symptoms may also include: Mental and emotional symptoms: Worrying excessively about natural disasters. Fear of being late. Difficulty concentrating. Fears that others are judging your performance. Physical symptoms: Fatigue. Headaches, muscle tension, muscle twitches, trembling, or feeling shaky. Feeling like your heart is pounding or beating very fast. Feeling out of breath or like you cannot take a deep breath. Having trouble falling asleep or staying asleep, or experiencing restlessness. Sweating. Nausea, diarrhea, or irritable bowel syndrome (IBS). Behavioral symptoms: Experiencing erratic moods or irritability. Avoidance of new  situations. Avoidance of people. Extreme difficulty making decisions. How is this diagnosed? This condition is diagnosed based on your symptoms and medical history. You will also have a physical exam. Your health care provider may perform tests to rule out other possible causes of your symptoms. To be diagnosed with GAD, a person must have anxiety that: Is out of his or her control. Affects several different aspects of his or her life, such as work and relationships. Causes distress that makes him or her unable to take part in normal activities. Includes at least three symptoms of GAD, such as restlessness, fatigue, trouble concentrating, irritability, muscle tension, or sleep problems. Before your health care provider can confirm a diagnosis of GAD, these symptoms must be present more days than they are not, and they must last for 6 months or longer. How is this treated? This condition may be treated with: Medicine. Antidepressant medicine is usually prescribed for long-term daily control. Anti-anxiety medicines may be added in severe cases, especially when panic attacks occur. Talk therapy (psychotherapy). Certain types of talk therapy can be helpful in treating GAD by providing support, education, and guidance. Options include: Cognitive behavioral therapy (CBT). People learn coping skills and self-calming techniques to ease their physical symptoms. They learn to identify unrealistic thoughts and behaviors and to replace them with more appropriate thoughts and behaviors. Acceptance and commitment therapy (ACT). This treatment teaches people how to be mindful as a way to cope with unwanted thoughts and feelings. Biofeedback. This process trains you to manage your body's response (physiological response) through breathing techniques and relaxation methods. You will work with a therapist while machines are used to monitor your physical symptoms. Stress management techniques. These include yoga,  meditation, and exercise. A mental health specialist can help determine which  treatment is best for you. Some people see improvement with one type of therapy. However, other people require a combination of therapies. Follow these instructions at home: Lifestyle Maintain a consistent routine and schedule. Anticipate stressful situations. Create a plan, and allow extra time to work with your plan. Practice stress management or self-calming techniques that you have learned from your therapist or your health care provider. General instructions Take over-the-counter and prescription medicines only as told by your health care provider. Understand that you are likely to have setbacks. Accept this and be kind to yourself as you persist to take better care of yourself. Recognize and accept your accomplishments, even if you judge them as small. Keep all follow-up visits as told by your health care provider. This is important. Contact a health care provider if: Your symptoms do not get better. Your symptoms get worse. You have signs of depression, such as: A persistently sad or irritable mood. Loss of enjoyment in activities that used to bring you joy. Change in weight or eating. Changes in sleeping habits. Avoiding friends or family members. Loss of energy for normal tasks. Feelings of guilt or worthlessness. Get help right away if: You have serious thoughts about hurting yourself or others. If you ever feel like you may hurt yourself or others, or have thoughts about taking your own life, get help right away. Go to your nearest emergency department or: Call your local emergency services (911 in the [place] Call a suicide crisis helpline, such as the [place] at (639)580-5818. This is open 24 hours a day in the [place] Text the Crisis Text Line at (360)244-6611 (in the [place].). Summary Generalized anxiety disorder (GAD) is a mental health condition that involves worry that is not  triggered by a specific event. People with GAD often worry excessively about many things in their lives, such as their health and family. GAD may cause symptoms such as restlessness, trouble concentrating, sleep problems, frequent sweating, nausea, diarrhea, headaches, and trembling or muscle twitching. A mental health specialist can help determine which treatment is best for you. Some people see improvement with one type of therapy. However, other people require a combination of therapies. This information is not intended to replace advice given to you by your health care provider. Make sure you discuss any questions you have with your health care provider. Document Revised: 11/08/2018 Document Reviewed: 11/08/2018 Elsevier Patient Education  [place].     Cognitive Behavioral Therapy Cognitive behavioral therapy (CBT) is a short-term, goal-oriented type of talk therapy. CBT can help you: Identify patterns of thinking, feeling, and behaving that are causing you problems. Decide how you want to think, feel, and respond to life events. Set goals to change the beliefs and thoughts that cause you to act in ways that are not helpful for you. Follow up on the changes that you make. What are the different types of CBT? The different types of CBT include: Dialectical behavioral therapy (DBT). This approach is often used in group therapy, and it helps you manage your behavior by focusing on: Things that cause problems to start (triggers). Methods of self-calming. Re-evaluating thinking processes. Mindfulness-based cognitive therapy. This approach involves focusing your attention, meditating, and developing awareness of the present moment (mindfulness). Rational emotive behavior therapy. This approach uses rational thought to reframe your thinking so it is less judgmental. Your therapist may directly challenge your thought processes. Stress inoculation training. This approach involves  planning ahead for stressful situations by practicing new  thoughts and behaviors. This planning can help you avoid going back to old actions. Acceptance and commitment therapy (ACT). This approach focuses on accepting yourself as you are and practicing mindfulness. It helps you understand what you would like to change and how you can set goals in that direction. What conditions is CBT used to treat? CBT may help to treat: Mental health conditions, including: Depression. Anxiety. Bipolar disorder. Eating disorders. Post-traumatic stress disorder (PTSD). Obsessive-compulsive disorder (OCD). Insomnia and other sleep disorders. Pain. Stress. Coping with loss or grief. Coping with a difficult medical diagnosis or illness. Relationship problems. Emotional distress or shock (trauma). How can CBT help me? CBT may: Give you a chance to share your thoughts, feelings, problems, and fears in a safe space. Help you focus on specific problems. Give you homework that helps you put theory into practice. Homework may include keeping a journal or doing thinking exercises. Help you become aware of your patterns of thinking, feeling, and behaving, and how those three patterns affect each other. Change your thoughts so that you can change your behaviors. Help you choose how you want to view the world. Teach you planned coping skills and offer better ways to deal with stress and difficult situations. To make the most of CBT, make sure you: Find a licensed therapist whom you trust. Take an active part in your therapy and do the homework that you are given. Are honest about your problems. Avoid skipping your therapy sessions. Summary Cognitive behavioral therapy (CBT) is a short-term, goal-oriented type of talk therapy. CBT can help you become aware of your patterns and the relationships among your thoughts, feelings, and behavior. CBT may help mental health conditions and other problems. This  information is not intended to replace advice given to you by your health care provider. Make sure you discuss any questions you have with your health care provider. Document Revised: 05/07/2020 Document Reviewed: 05/07/2020 Elsevier Patient Education  [place].   Venlafaxine Extended-Release Capsules What is this medication? VENLAFAXINE (VEN la fax een) treats depression and anxiety. It increases the amount of serotonin and norepinephrine in the brain, hormones that help regulate mood. It belongs to a group of medications called SNRIs. This medicine may be used for other purposes; ask your health care provider or pharmacist if you have questions. COMMON BRAND NAME(S): Effexor XR What should I tell my care team before I take this medication? They need to know if you have any of these conditions: Bleeding disorders Glaucoma Heart disease High blood pressure High cholesterol Kidney disease Liver disease Low levels of sodium in the blood Mania or bipolar disorder Seizures Suicidal thoughts, plans, or attempt; a previous suicide attempt by you or a family Take medications that treat or prevent blood clots Thyroid disease An unusual or allergic reaction to venlafaxine, desvenlafaxine, other medications, foods, dyes, or preservatives Pregnant or trying to get pregnant Breast-feeding How should I use this medication? Take this medication by mouth with a full glass of water. Follow the directions on the prescription label. Do not cut, crush, or chew this medication. Take it with food. If needed, the capsule may be carefully opened and the entire contents sprinkled on a spoonful of cool applesauce. Swallow the applesauce/pellet mixture right away without chewing and follow with a glass of water to ensure complete swallowing of the pellets. Try to take your medication at about the same time each day. Do not take your medication more often than directed. Do not stop taking this medication  suddenly except upon the advice of your care team. Stopping this medication too quickly may cause serious side effects or your condition may worsen. A special MedGuide will be given to you by the pharmacist with each prescription and refill. Be sure to read this information carefully each time. Talk to your care team regarding the use of this medication in children. Special care may be needed. Overdosage: If you think you have taken too much of this medicine contact a poison control center or emergency room at once. NOTE: This medicine is only for you. Do not share this medicine with others. What if I miss a dose? If you miss a dose, take it as soon as you can. If it is almost time for your next dose, take only that dose. Do not take double or extra doses. What may interact with this medication? Do not take this medication with any of the following: Certain medications for fungal infections like fluconazole, itraconazole, ketoconazole, posaconazole, voriconazole Cisapride Desvenlafaxine Dronedarone Duloxetine Levomilnacipran Linezolid MAOIs like Carbex, Eldepryl, Marplan, Nardil, and Parnate Methylene blue (injected into a vein) Milnacipran Pimozide Thioridazine This medication may also interact with the following: Amphetamines Aspirin and aspirin-like medications Certain medications for depression, anxiety, or psychotic disturbances Certain medications for migraine headaches like almotriptan, eletriptan, frovatriptan, naratriptan, rizatriptan, sumatriptan, zolmitriptan Certain medications for sleep Certain medications that treat or prevent blood clots like dalteparin, enoxaparin, warfarin Cimetidine Clozapine Diuretics Fentanyl Furazolidone Indinavir Isoniazid Lithium Metoprolol NSAIDS, medications for pain and inflammation, like ibuprofen or naproxen Other medications that prolong the QT interval (cause an abnormal heart rhythm) like dofetilide,  ziprasidone Procarbazine Rasagiline Supplements like St. John's wort, kava kava, valerian Tramadol Tryptophan This list may not describe all possible interactions. Give your health care provider a list of all the medicines, herbs, non-prescription drugs, or dietary supplements you use. Also tell them if you smoke, drink alcohol, or use illegal drugs. Some items may interact with your medicine. What should I watch for while using this medication? Tell your care team if your symptoms do not get better or if they get worse. Visit your care team for regular checks on your progress. Because it may take several weeks to see the full effects of this medication, it is important to continue your treatment as prescribed by your care team. Watch for new or worsening thoughts of suicide or depression. This includes sudden changes in mood, behaviors, or thoughts. These changes can happen at any time but are more common in the beginning of treatment or after a change in dose. Call your care team right away if you experience these thoughts or worsening depression. Manic episodes may happen in patients with bipolar disorder who take this medication. Watch for changes in feelings or behaviors such as feeling anxious, nervous, agitated, panicky, irritable, hostile, aggressive, impulsive, severely restless, overly excited and hyperactive, or trouble sleeping. These changes can happen at any time but are more common in the beginning of treatment or after a change in dose. Call your care team right away if you notice any of these symptoms. This medication can cause an increase in blood pressure. Check with your care team for instructions on monitoring your blood pressure while taking this medication. You may get drowsy or dizzy. Do not drive, use machinery, or do anything that needs mental alertness until you know how this medication affects you. Do not stand or sit up quickly, especially if you are an older patient. This  reduces the risk of dizzy or fainting  spells. Alcohol may interfere with the effect of this medication. Avoid alcoholic drinks. Your mouth may get dry. Chewing sugarless gum, sucking hard candy and drinking plenty of water will help. Contact your care team if the problem does not go away or is severe. What side effects may I notice from receiving this medication? Side effects that you should report to your care team as soon as possible: Allergic reactions-skin rash, itching, hives, swelling of the face, lips, tongue, or throat Bleeding-bloody or black, tar-like stools, red or dark brown urine, vomiting blood or brown material that looks like coffee grounds, small, red or purple spots on skin, unusual bleeding or bruising Heart rhythm changes-fast or irregular heartbeat, dizziness, feeling faint or lightheaded, chest pain, trouble breathing Increase in blood pressure Loss of appetite with weight loss Low sodium level-muscle weakness, fatigue, dizziness, headache, confusion Serotonin syndrome-irritability, confusion, fast or irregular heartbeat, muscle stiffness, twitching muscles, sweating, high fever, seizures, chills, vomiting, diarrhea Sudden eye pain or change in vision such as blurry vision, seeing halos around lights, vision loss Thoughts of suicide or self-harm, worsening mood, feelings of depression Side effects that usually do not require medical attention (report to your care team if they continue or are bothersome): Anxiety, nervousness Change in sex drive or performance Dizziness Dry mouth Excessive sweating Nausea Tremors or shaking Trouble sleeping This list may not describe all possible side effects. Call your doctor for medical advice about side effects. You may report side effects to FDA at 1-800-FDA-1088. Where should I keep my medication? Keep out of the reach of children and pets. Store at a controlled temperature between 20 and 25 degrees C (68 degrees and 77 degrees F),  in a dry place. Throw away any unused medication after the expiration date. NOTE: This sheet is a summary. It may not cover all possible information. If you have questions about this medicine, talk to your doctor, pharmacist, or health care provider.  2022 Elsevier/Gold Standard (2019-12-10 14:56:43)   Please be aware that some of the medications that you are on can sometimes cause a rare and potentially dangerous adverse reaction, called SEROTONIN SYNDROME: Symptoms of this condition include (but are not limited to):  Agitation or restlessness, confusion, rapid heart rate and high blood pressure, dilated pupils, loss of muscle coordination or twitching muscles, muscle rigidity/stiffness, sweating and/or flushing, diarrhea, headache, shivering, goose bumps. If you have any of these symptoms you may have to stop the medication. Call your health care provider immediately.  Severe serotonin syndrome can be life-threatening emergency. Signs and symptoms of a severe reaction may include: high fever, seizures, irregular heartbeat, unconsciousness or altered level of awareness or personality changes.  If you have any of these new symptoms, call 911 or have someone take you to the emergency room.

## 2020-10-22 NOTE — Progress Notes (Signed)
Assessment and Plan:  Hypertension Well controlled off of medications at this time Monitor blood pressure at home; patient to call if consistently greater than 130/80 Continue DASH diet.   Reminder to go to the ER if any CP, SOB, nausea, dizziness, severe HA, changes vision/speech, left arm numbness and tingling and jaw pain.   Cholesterol Total cholesterol at goal  Discussed lifestyle for triglyceride elevation Continue low cholesterol diet and exercise.  Check lipid panel.    Other abnormal glucose  Discussed diet/exercise, weight management  Check A1C;    BMI 27 Long discussion about weight loss, diet, and exercise Recommended diet heavy in fruits and veggies and low in animal meats, cheeses, and dairy products, appropriate calorie intake Discussed ideal weight for height  Will follow up in 3 months   Vitamin D Def Approaching goal at last visit; he has not increase dose due to intolerance continue supplementation  Defer Vit D level   Ulcerative colitis Doing well, monitors diet Has Lomotil PRN  Grief reaction Generalized anxiety disorder Hx of SE with SSRI, GAD-7+, will try effexor with buspar TID PRN to bridge with rare xanax Start new medication as prescribed Stress management techniques discussed, increase water, good sleep hygiene discussed, increase exercise, and increase veggies.  Contact grief counselor, would also recommend CBT, information provided Denies SI/HI Follow up 8-12 weeks, call the office if any new AE's from medications and we will switch them -     venlafaxine XR (EFFEXOR XR) 75 MG 24 hr capsule; Take 1 cap daily for mood/anxiety. -     busPIRone (BUSPAR) 5 MG tablet; Take 1-2 tab three times a day as needed for anxiety.  Further disposition pending results of labs. Discussed med's effects and SE's.   Over 30 minutes of exam, counseling, chart review, and critical decision making was performed.   Future Appointments  Date Time Provider  [place]  12/17/2020  3:30 PM [name], NP [facility] None  05/14/2021  9:00 AM [name], MD [facility] None    ------------------------------------------------------------------------------------------------------------------   HPI BP 114/64   Pulse 78   Temp (!) 97.5 F (36.4 C)   Wt 175 lb (79.4 kg)   SpO2 99%   BMI 27.41 kg/m  64 y.o.male presents for evaluation of mood and also requesting 6 month follow up labs that are due.   He reports long history of depression/mood, strong family history, reports hx of poorly tolerating several meds - citalopram, escitalopram, sertraline, trintellix - he reports after 4 weeks has "buzzing, burning, electric" sensation in head and typically stops (wife does note benefit with his mood). Most recently prescribed cymbalta but unsure how he did. Recently reports increased stress at work and sister recently unexpectedly passed by suicide, mood has been much worse. [place] when busy but mind races at home, very anxious, irritable.   He is currently prescribed alprazolam 0.5 mg BID PRN, tries to limit, uses 3-4 times a month, limits use due to family history of addiction.   PHQ-9 of 10, GAD-7 of 16. Reports rare alcohol. Denies drug use. Denies SI/HI. He reports did grief counseling following previous death in family, was very helpful, has contact info at home and receptive to following up.   BMI is Body mass index is 27.41 kg/m., he has not been working on diet and exercise. Wt Readings from Last 3 Encounters:  10/22/20 175 lb (79.4 kg)  05/01/20 170 lb 6.4 oz (77.3 kg)  01/29/20 171 lb (77.6 kg)   Patient  has Hx/o Ulcerative Colitis underwent Total Colectomy in 2000 and has 8-10 loose BM's daily.   His blood pressure has been controlled at home, today their BP is BP: 114/64  He does not workout. He denies chest pain, shortness of breath, dizziness.   He is on cholesterol medication and denies myalgias. His cholesterol is not  at goal. The cholesterol last visit was:   Lab Results  Component Value Date   CHOL 176 05/01/2020   HDL 49 05/01/2020   LDLCALC 101 (H) 05/01/2020   TRIG 164 (H) 05/01/2020   CHOLHDL 3.6 05/01/2020    He has not been working on diet and exercise for prediabetes, and denies increased appetite, nausea, paresthesia of the feet, polydipsia, and polyuria. Last A1C in the office was:  Lab Results  Component Value Date   HGBA1C 5.7 (H) 05/01/2020   Patient is on Vitamin D supplement, he takes intermittently as tolerated.    Lab Results  Component Value Date   VD25OH 43 05/01/2020        Past Medical History:  Diagnosis Date   DJD (degenerative joint disease)    Elevated hemoglobin A1c    Hyperlipidemia    Hypertension      Allergies  Allergen Reactions   Codeine Nausea And Vomiting   Norco [Hydrocodone-Acetaminophen]    Penicillins Nausea And Vomiting    Current Outpatient Medications on File Prior to Visit  Medication Sig   ALPRAZolam (XANAX) 0.5 MG tablet Take 1/2 to 1 tablet 2 to 3 x /day as needed for Anxiety   B Complex-C (B-COMPLEX WITH VITAMIN C) tablet Take 1 tablet by mouth daily.   Cholecalciferol (VITAMIN D PO) Take 200 Units by mouth daily.    diphenoxylate-atropine (LOMOTIL) 2.5-0.025 MG tablet Take 1 to 2 tablets up to 4 x /day for Diarrhea (max 8 tablets /day)   meloxicam (MOBIC) 7.5 MG tablet TAKE 1 TABLET(7.5 MG) AS NEEDED FOR BACK PAIN   rosuvastatin (CRESTOR) 5 MG tablet Take 1 tab 3 days/week (MWF) for cholesterol.   No current facility-administered medications on file prior to visit.    ROS: all negative except above.   Physical Exam:  BP 114/64   Pulse 78   Temp (!) 97.5 F (36.4 C)   Wt 175 lb (79.4 kg)   SpO2 99%   BMI 27.41 kg/m   General Appearance: Well nourished, in no apparent distress. Eyes: PERRLA, EOMs, conjunctiva no swelling or erythema Sinuses: No Frontal/maxillary tenderness ENT/Mouth: Ext aud canals clear, TMs without  erythema, bulging. No erythema, swelling, or exudate on post pharynx.  Tonsils not swollen or erythematous. Hearing normal.  Neck: Supple, thyroid normal.  Respiratory: Respiratory effort normal, BS equal bilaterally without rales, rhonchi, wheezing or stridor.  Cardio: RRR with no MRGs. Brisk peripheral pulses without edema.  Abdomen: Soft, + BS.  Non tender, no guarding, rebound, hernias, masses. Lymphatics: Non tender without lymphadenopathy.  Musculoskeletal: Full ROM, 5/5 strength, normal gait.  Skin: Warm, dry without rashes, lesions, ecchymosis.  Neuro: Cranial nerves intact. Normal muscle tone, no cerebellar symptoms. Sensation intact.  Psych: Awake and oriented X 3, normal affect, Insight and Judgment appropriate.     Izora Ribas, NP 5:12 PM [facility]

## 2020-10-23 LAB — CBC WITH DIFFERENTIAL/PLATELET
Absolute Monocytes: 648 cells/uL (ref 200–950)
Basophils Absolute: 47 cells/uL (ref 0–200)
Basophils Relative: 0.6 %
Eosinophils Absolute: 213 cells/uL (ref 15–500)
Eosinophils Relative: 2.7 %
HCT: 42.1 % (ref 38.5–50.0)
Hemoglobin: 14.2 g/dL (ref 13.2–17.1)
Lymphs Abs: 1754 cells/uL (ref 850–3900)
MCH: 30.8 pg (ref 27.0–33.0)
MCHC: 33.7 g/dL (ref 32.0–36.0)
MCV: 91.3 fL (ref 80.0–100.0)
MPV: 10.4 fL (ref 7.5–12.5)
Monocytes Relative: 8.2 %
Neutro Abs: 5238 cells/uL (ref 1500–7800)
Neutrophils Relative %: 66.3 %
Platelets: 273 10*3/uL (ref 140–400)
RBC: 4.61 10*6/uL (ref 4.20–5.80)
RDW: 13.6 % (ref 11.0–15.0)
Total Lymphocyte: 22.2 %
WBC: 7.9 10*3/uL (ref 3.8–10.8)

## 2020-10-23 LAB — COMPLETE METABOLIC PANEL WITH GFR
AG Ratio: 1.7 (calc) (ref 1.0–2.5)
ALT: 25 U/L (ref 9–46)
AST: 25 U/L (ref 10–35)
Albumin: 4.5 g/dL (ref 3.6–5.1)
Alkaline phosphatase (APISO): 68 U/L (ref 35–144)
BUN: 12 mg/dL (ref 7–25)
CO2: 25 mmol/L (ref 20–32)
Calcium: 9.7 mg/dL (ref 8.6–10.3)
Chloride: 104 mmol/L (ref 98–110)
Creat: 0.88 mg/dL (ref 0.70–1.35)
Globulin: 2.6 g/dL (calc) (ref 1.9–3.7)
Glucose, Bld: 99 mg/dL (ref 65–99)
Potassium: 4.4 mmol/L (ref 3.5–5.3)
Sodium: 139 mmol/L (ref 135–146)
Total Bilirubin: 0.4 mg/dL (ref 0.2–1.2)
Total Protein: 7.1 g/dL (ref 6.1–8.1)
eGFR: 96 mL/min/{1.73_m2} (ref >=60)

## 2020-10-23 LAB — HEMOGLOBIN A1C
Hgb A1c MFr Bld: 5.5 % of total Hgb (ref <5.7)
Mean Plasma Glucose: 111 mg/dL
eAG (mmol/L): 6.2 mmol/L

## 2020-10-23 LAB — LIPID PANEL
Cholesterol: 156 mg/dL (ref <200)
HDL: 66 mg/dL (ref >=40)
LDL Cholesterol (Calc): 63 mg/dL (calc)
Non-HDL Cholesterol (Calc): 90 mg/dL (calc) (ref <130)
Total CHOL/HDL Ratio: 2.4 (calc) (ref <5.0)
Triglycerides: 205 mg/dL — ABNORMAL HIGH (ref <150)

## 2020-10-23 LAB — MAGNESIUM: Magnesium: 2 mg/dL (ref 1.5–2.5)

## 2020-11-03 ENCOUNTER — Telehealth: Payer: Self-pay

## 2020-11-03 NOTE — Telephone Encounter (Signed)
Caryl Pina prescribed venlafaxine on 10/22/20. He had to stop the medication due to extreme nausea, fatigue, and pressure in head.  Since he has stopped the medication, his symptoms have resolved.   He wanted to know what he should do.

## 2020-11-03 NOTE — Telephone Encounter (Signed)
I would try the Buspar and see if this is helping at all if not schedule follow up appointment with Caryl Pina

## 2020-11-03 NOTE — Telephone Encounter (Signed)
Did he try taking the Buspar also?

## 2020-11-06 ENCOUNTER — Ambulatory Visit: Payer: BC Managed Care – PPO | Admitting: Adult Health

## 2020-12-17 ENCOUNTER — Ambulatory Visit: Payer: BC Managed Care – PPO | Admitting: Adult Health

## 2021-01-06 ENCOUNTER — Ambulatory Visit (INDEPENDENT_AMBULATORY_CARE_PROVIDER_SITE_OTHER): Payer: BC Managed Care – PPO | Admitting: Adult Health

## 2021-01-06 ENCOUNTER — Encounter: Payer: Self-pay | Admitting: Adult Health

## 2021-01-06 ENCOUNTER — Other Ambulatory Visit: Payer: Self-pay

## 2021-01-06 DIAGNOSIS — R197 Diarrhea, unspecified: Secondary | ICD-10-CM

## 2021-01-06 DIAGNOSIS — R109 Unspecified abdominal pain: Secondary | ICD-10-CM

## 2021-01-06 MED ORDER — METRONIDAZOLE 500 MG PO TABS: 500.0000 mg | ORAL_TABLET | Freq: Three times a day (TID) | ORAL | 0 refills | Status: AC

## 2021-01-06 MED ORDER — MELOXICAM 7.5 MG PO TABS: ORAL_TABLET | ORAL | 1 refills | Status: DC

## 2021-01-06 MED ORDER — CIPROFLOXACIN HCL 500 MG PO TABS: 500.0000 mg | ORAL_TABLET | Freq: Two times a day (BID) | ORAL | 0 refills | Status: AC

## 2021-01-06 NOTE — Progress Notes (Signed)
Assessment and Plan:  Hypertension Well controlled off of medications at this time Monitor blood pressure at home; patient to call if consistently greater than 130/80 Continue DASH diet.   Reminder to go to the ER if any CP, SOB, nausea, dizziness, severe HA, changes vision/speech, left arm numbness and tingling and jaw pain.   Cholesterol Total cholesterol at goal  Discussed lifestyle for triglyceride elevation Continue low cholesterol diet and exercise.  Check lipid panel.    Other abnormal glucose  Discussed diet/exercise, weight management  Check A1C;    BMI 27 Long discussion about weight loss, diet, and exercise Recommended diet heavy in fruits and veggies and low in animal meats, cheeses, and dairy products, appropriate calorie intake Discussed ideal weight for height  Will follow up in 3 months   Vitamin D Def Approaching goal at last visit; he has not increase dose due to intolerance continue supplementation  Defer Vit D level   Ulcerative colitis No flares, monitors diet Has Lomotil PRN  Grief reaction Generalized anxiety disorder Hx of SE with SSRI, GAD-7+, very poorly tolerant of many meds Declines further daily med at this time Bridge with rare xanax Stress management techniques discussed, increase water, good sleep hygiene discussed, increase exercise, and increase veggies.  Repeated by recommendation for CBT, information provided Denies SI/HI  GI discomfort ? Atypical diverticulitis s/p colectomy resection, reports hx of similar in past resolved quickly with cipro/flagyl after extensive workup including GI referral Discussed tx and sent in with instructions for close monitoring After completion add probiotic - "align" or "GI stability" followed by soluble fiber supplement Follow up sooner if persistent/recurrent or new concerning sx  - defer routine labs, just had <90 days ago, last reviewed  Further disposition pending results of labs. Discussed med's  effects and SE's.   Over 30 minutes of exam, counseling, chart review, and critical decision making was performed.   Future Appointments  Date Time Provider [place]  05/14/2021 11:00 AM [name], MD [facility] None    ------------------------------------------------------------------------------------------------------------------   HPI BP 118/64   Pulse 79   Temp 97.7 F (36.5 C)   Wt 175 lb (79.4 kg)   SpO2 98%   BMI 27.41 kg/m  65 y.o.male presents for 6 month follow up and mood follow up.   He long history of depression/mood, strong family history, reports hx of poorly tolerating several meds - citalopram, escitalopram, sertraline, trintellix - he reports after 4 weeks has "buzzing, burning, electric" sensation in head and typically stops (wife does note benefit with his mood). Most recently prescribed cymbalta but unsure how he did. Recently reports increased stress at work and sister recently unexpectedly passed by suicide, mood was worse and started trial of effexor, also given PRN buspar. He reports had immediate sedation with both meds and stopped after 3-4 days. He reports overall somewhat improved. Will be retiring in the upcoming year.   He is currently prescribed alprazolam 0.5 mg BID PRN, tries to limit, uses 3-4 times a month, limits use due to family history of addiction.   Patient has Hx/o Ulcerative Colitis underwent Total Colectomy in 2000 and has 8-10 loose BM's daily. He reports over the weekend started having more pressure and discomfort with passing stools, increased bloating and cramping. Denies fever/chills, pain. Denies belching/bloating, blood in stool, mucus in stool. Reports previously had similar that was extensively worked up, ultimately prescribed cipro/flagyl that quickly resolved sx and remained well controlled in the past year.   BMI is Body  mass index is 27.41 kg/m., he has not been working on diet and exercise, very active, walks 2-4  miles daily at work. Careful with roughage due to GI. Not much bread, no beans. Careful with fruit.  Wt Readings from Last 3 Encounters:  01/06/21 175 lb (79.4 kg)  10/22/20 175 lb (79.4 kg)  05/01/20 170 lb 6.4 oz (77.3 kg)   His blood pressure has been controlled at home, today their BP is BP: 118/64  He does not workout. He denies chest pain, shortness of breath, dizziness.   He is on cholesterol medication (rosuvastatin 5 mg three days week) and denies myalgias. His cholesterol is not at goal. The cholesterol last visit was:   Lab Results  Component Value Date   CHOL 156 10/22/2020   HDL 66 10/22/2020   LDLCALC 63 10/22/2020   TRIG 205 (H) 10/22/2020   CHOLHDL 2.4 10/22/2020    He has not been working on diet and exercise for prediabetes, and denies increased appetite, nausea, paresthesia of the feet, polydipsia, and polyuria. Last A1C in the office was:  Lab Results  Component Value Date   HGBA1C 5.5 10/22/2020   Patient is on Vitamin D supplement, he takes intermittently as tolerated.    Lab Results  Component Value Date   VD25OH 43 05/01/2020        Past Medical History:  Diagnosis Date   DJD (degenerative joint disease)    Elevated hemoglobin A1c    Hyperlipidemia    Hypertension      Allergies  Allergen Reactions   Codeine Nausea And Vomiting   Norco [Hydrocodone-Acetaminophen]    Penicillins Nausea And Vomiting    Current Outpatient Medications on File Prior to Visit  Medication Sig   ALPRAZolam (XANAX) 0.5 MG tablet Take 1/2 to 1 tablet 2 to 3 x /day as needed for Anxiety   B Complex-C (B-COMPLEX WITH VITAMIN C) tablet Take 1 tablet by mouth daily.   Cholecalciferol (VITAMIN D PO) Take 200 Units by mouth daily.    diphenoxylate-atropine (LOMOTIL) 2.5-0.025 MG tablet Take 1 to 2 tablets up to 4 x /day for Diarrhea (max 8 tablets /day)   meloxicam (MOBIC) 7.5 MG tablet TAKE 1 TABLET(7.5 MG) AS NEEDED FOR BACK PAIN   rosuvastatin (CRESTOR) 5 MG tablet Take  1 tab 3 days/week (MWF) for cholesterol.   busPIRone (BUSPAR) 5 MG tablet Take 1-2 tab three times a day as needed for anxiety. (Patient not taking: Reported on 01/06/2021)   venlafaxine XR (EFFEXOR XR) 75 MG 24 hr capsule Take 1 cap daily for mood/anxiety. (Patient not taking: Reported on 01/06/2021)   No current facility-administered medications on file prior to visit.    ROS: all negative except above.   Physical Exam:  BP 118/64   Pulse 79   Temp 97.7 F (36.5 C)   Wt 175 lb (79.4 kg)   SpO2 98%   BMI 27.41 kg/m   General Appearance: Well nourished, in no apparent distress. Eyes: PERRLA, EOMs, conjunctiva no swelling or erythema Sinuses: No Frontal/maxillary tenderness ENT/Mouth: Ext aud canals clear, TMs without erythema, bulging. No erythema, swelling, or exudate on post pharynx.  Tonsils not swollen or erythematous. Hearing normal.  Neck: Supple, thyroid normal.  Respiratory: Respiratory effort normal, BS equal bilaterally without rales, rhonchi, wheezing or stridor.  Cardio: RRR with no MRGs. Brisk peripheral pulses without edema.  Abdomen: Soft, mildly distended + very active BS.  Well healed midline incision with hernia on bearing down, quickly reduced.  Non tender, no guarding, rebound, palpable masses. Lymphatics: Non tender without lymphadenopathy.  Musculoskeletal: Full ROM, 5/5 strength, normal gait.  Skin: Warm, dry without rashes, lesions, ecchymosis.  Neuro: Cranial nerves intact. Normal muscle tone, no cerebellar symptoms. Sensation intact.  Psych: Awake and oriented X 3, normal affect, Insight and Judgment appropriate.     Dillon Ribas, NP 2:55 PM [facility]

## 2021-01-28 NOTE — Progress Notes (Signed)
Assessment and Plan:  Dillon [name] was seen today for gi problem.  Diagnoses and all orders for this visit:  Incisional hernia, without obstruction or gangrene Ventral hernia without obstruction or gangrene Tender scar Mild sx, progressive over 20 years, without nausea, vomiting or concern with obstruction or gangrene, only with bearing down  Suspect his sx are more from adhesions, still fairly mild Discussed surgical options are available though with his relatively mild sx, may benefit from PT, muscle relaxer may be beneficial. He is interested in discussing all options further with general surgery. He believes his surgeon from [place] is retired, interested in Programmer, systems provider Dr. Marcello Moores at [place].  -     Ambulatory referral to General Surgery  Actinic keratoses 3 freeze and thaw technique after verbal permission Tolerated well Follow up as scheduled with derm     Further disposition pending results of labs. Discussed med's effects and SE's.   Over 30 minutes of exam, counseling, chart review, and critical decision making was performed.   Future Appointments  Date Time Provider [place]  05/14/2021 11:00 AM [name], MD [facility] None    ------------------------------------------------------------------------------------------------------------------   HPI BP 130/70    Pulse 87    Temp 97.7 F (36.5 C)    Wt 169 lb (76.7 kg)    SpO2 99%    BMI 26.47 kg/m  65 y.o.male with hx of UC presents for evaluation of "stomach issues."  He has hx of ulcerative colitis, s/p total colectomy in 2000.   Since that time he reports gradual onset of tightness/pulling sensation along his midline incision that has been progressive, worse after eating or if bloated, notes much more tightness/bloating while lifting at work, "really tight/tender" sensation. Applying heat and slow deep breaths seem to help relive sx slightly. He also has known ventral/surgical hernia. His  surgeon was at [facility], believes has retired.   Also concerned about scaly lesion to R temple, will see derm in a few months but interested in proceeding with cryo today.   Past Medical History:  Diagnosis Date   DJD (degenerative joint disease)    Elevated hemoglobin A1c    Hyperlipidemia    Hypertension      Allergies  Allergen Reactions   Codeine Nausea And Vomiting   Norco [Hydrocodone-Acetaminophen]    Penicillins Nausea And Vomiting    Current Outpatient Medications on File Prior to Visit  Medication Sig   ALPRAZolam (XANAX) 0.5 MG tablet Take 1/2 to 1 tablet 2 to 3 x /day as needed for Anxiety   B Complex-C (B-COMPLEX WITH VITAMIN C) tablet Take 1 tablet by mouth daily.   Cholecalciferol (VITAMIN D PO) Take 200 Units by mouth daily.    diphenoxylate-atropine (LOMOTIL) 2.5-0.025 MG tablet Take 1 to 2 tablets up to 4 x /day for Diarrhea (max 8 tablets /day)   meloxicam (MOBIC) 7.5 MG tablet TAKE 1 TABLET(7.5 MG) AS NEEDED FOR BACK PAIN   rosuvastatin (CRESTOR) 5 MG tablet Take 1 tab 3 days/week (MWF) for cholesterol.   No current facility-administered medications on file prior to visit.   Allergies:  Allergies  Allergen Reactions   Codeine Nausea And Vomiting   Norco [Hydrocodone-Acetaminophen]    Penicillins Nausea And Vomiting   Medical History:  has DJD (degenerative joint disease); History of prediabetes; Ulcerative colitis ([place]); Mixed hyperlipidemia; Elevated blood pressure reading without diagnosis of hypertension; Vitamin D deficiency; Medication management; BMI 26.0-26.9,adult; and Generalized anxiety disorder on their problem list. Surgical History:  He  has a past surgical history that includes Hand surgery (Right, 1975); Elbow surgery (Right, 2014); Colon surgery (2000, reversal 2001); Elbow ligament reconstruction (Right, July 2014); Elbow ligament reconstruction (Left, Sept 2016); and Hand surgery (Right, 1975). Social History:   reports that he has never  smoked. He has never used smokeless tobacco. He reports current alcohol use. He reports that he does not use drugs.   ROS: all negative except above.   Physical Exam:  BP 130/70    Pulse 87    Temp 97.7 F (36.5 C)    Wt 169 lb (76.7 kg)    SpO2 99%    BMI 26.47 kg/m   General Appearance: Well nourished, in no apparent distress. Eyes: PERRLA, conjunctiva no swelling or erythema ENT/Mouth: mask in place; Hearing normal.  Neck: Supple, thyroid normal.  Respiratory: Respiratory effort normal, BS equal bilaterally without rales, rhonchi, wheezing or stridor.  Cardio: RRR with no MRGs. Brisk peripheral pulses without edema.  Abdomen: Soft, + BS.  + ventral hernia with bearing down. He has midline incision (well healed) extending along mot of abdominal midline. Mild tenderness along incision. No guarding, rebound, palpable masses. Lymphatics: Non tender without lymphadenopathy.  Musculoskeletal: no obvious deformity; normal gait.  Skin: Warm, dry without rashes, ecchymosis. R temple with 0.8 cm area of erythematous base with scaling.  Neuro: Normal muscle tone Psych: Awake and oriented X 3, normal affect, Insight and Judgment appropriate.    Izora Ribas, NP 9:05 AM [facility]

## 2021-01-29 ENCOUNTER — Ambulatory Visit (INDEPENDENT_AMBULATORY_CARE_PROVIDER_SITE_OTHER): Payer: BC Managed Care – PPO | Admitting: Adult Health

## 2021-01-29 ENCOUNTER — Encounter: Payer: Self-pay | Admitting: Adult Health

## 2021-01-29 ENCOUNTER — Other Ambulatory Visit: Payer: Self-pay

## 2021-01-29 VITALS — BP 130/70 | HR 87 | Temp 97.7°F | Wt 169.0 lb

## 2021-01-29 DIAGNOSIS — L905 Scar conditions and fibrosis of skin: Secondary | ICD-10-CM

## 2021-01-29 DIAGNOSIS — K439 Ventral hernia without obstruction or gangrene: Secondary | ICD-10-CM

## 2021-01-29 DIAGNOSIS — L570 Actinic keratosis: Secondary | ICD-10-CM

## 2021-01-29 DIAGNOSIS — K432 Incisional hernia without obstruction or gangrene: Secondary | ICD-10-CM

## 2021-01-29 NOTE — Patient Instructions (Signed)
Ventral Hernia A ventral hernia is a bulge of tissue from inside the abdomen that pushes through a weak area of the muscles that form the front wall of the abdomen. The tissues inside the abdomen are inside a sac (peritoneum). These tissues include the small intestine, large intestine, and the fatty tissue that covers the intestines (omentum). Sometimes, the bulge that forms a hernia contains intestines. Other hernias contain only fat. Ventral hernias do not go away without surgical treatment. There are several types of ventral hernias. You may have: A hernia at an incision site from previous abdominal surgery (incisional hernia). A hernia just above the belly button (epigastric hernia), or at the belly button (umbilical hernia). These types of hernias can develop from heavy lifting or straining. A hernia that comes and goes (reducible hernia). It may be visible only when you lift or strain. This type of hernia can be pushed back into the abdomen (reduced). A hernia that traps abdominal tissue inside the hernia (incarcerated hernia). This type of hernia does not reduce. A hernia that cuts off blood flow to the tissues inside the hernia (strangulated hernia). The tissues can start to die if this happens. This is a very painful bulge that cannot be reduced. A strangulated hernia is a medical emergency. What are the causes? This condition is caused by abdominal tissue putting pressure on an area of weakness in the abdominal muscles. What increases the risk? The following factors may make you more likely to develop this condition: Being age 43 or older. Being overweight or obese. Having had previous abdominal surgery, especially if there was an infection after surgery. Having had an injury to the abdominal wall. Frequently lifting or pushing heavy objects. Having had several pregnancies. Having a buildup of fluid inside the abdomen (ascites). Straining to have a bowel movement or to urinate. Having  frequent coughing episodes. What are the signs or symptoms? The only symptom of a ventral hernia may be a painless bulge in the abdomen. A reducible hernia may be visible only when you strain, cough, or lift. Other symptoms may include: Dull pain. A feeling of pressure. Signs and symptoms of a strangulated hernia may include: Increasing pain. Nausea and vomiting. Pain when pressing on the hernia. The skin over the hernia turning red or purple. Constipation. Blood in the stool (feces). How is this diagnosed? This condition may be diagnosed based on: Your symptoms. Your medical history. A physical exam. You may be asked to cough or strain while standing. These actions increase the pressure inside your abdomen and force the hernia through the opening in your muscles. Your health care provider may try to reduce the hernia by gently pushing the hernia back in. Imaging studies, such as an ultrasound or CT scan. How is this treated? This condition is treated with surgery. If you have a strangulated hernia, surgery is done as soon as possible. If your hernia is small and not incarcerated, you may be asked to lose some weight before surgery. Follow these instructions at home: Follow instructions from your health care provider about eating or drinking restrictions. If you are overweight, your health care provider may recommend that you increase your activity level and eat a healthier diet. Do not lift anything that is heavier than 10 lb (4.5 kg), or the limit that you are told, until your health care provider says that it is safe. Return to your normal activities as told by your health care provider. Ask your health care provider what activities are  safe for you. You may need to avoid activities that increase pressure on your hernia. Take over-the-counter and prescription medicines only as told by your health care provider. Keep all follow-up visits. This is important. Contact a health care provider  if: Your hernia gets larger. Your hernia becomes painful. Get help right away if: Your hernia becomes increasingly painful. You have pain along with any of the following: Changes in skin color in the area of the hernia. Nausea. Vomiting. Fever. These symptoms may represent a serious problem that is an emergency. Do not wait to see if the symptoms will go away. Get medical help right away. Call your local emergency services (911 in the [place] Do not drive yourself to the hospital. Summary A ventral hernia is a bulge of tissue from inside the abdomen that pushes through a weak area of the muscles that form the front wall of the abdomen. This condition is treated with surgery, which may be urgent depending on your hernia. Do not lift anything that is heavier than 10 lb (4.5 kg), and follow activity instructions from your health care provider. This information is not intended to replace advice given to you by your health care provider. Make sure you discuss any questions you have with your health care provider. Document Revised: 09/07/2019 Document Reviewed: 09/07/2019 Elsevier Patient Education  2022 [place].    Adhesions Adhesions are stringy (fibrous) bands of tissue that stick together. They form between two surfaces of the body. Adhesions are similar to scars, but they form inside your body instead of on your skin. Adhesions can be painful and may cause problems if they pull tissues or organs out of their normal positions. They can also cause problems if they block the normal passage of substances in the body. For example, an adhesion that forms in the intestines can block the passage of food. What are the causes? This condition is caused by inflammation. Adhesions most often develop after surgery, but they can also develop after an infection, radiation treatment, or another event that causes inflammation in the body. What increases the risk? This condition is more likely to develop  in: People who have had open surgery in the abdomen. Women who have had more than one cesarean section. What are the signs or symptoms? Symptoms of this condition vary depending on where the adhesions form. They may take weeks, months, or years to develop. Symptoms include: Pain in the abdomen or pelvis. This is the most common symptom. Bloating. Constipation. Diarrhea. Vomiting. Pain when having sex. Difficulty getting pregnant. Often, there are no symptoms. How is this diagnosed? This condition is diagnosed based on: Your symptoms. Your medical history. A physical exam. Tests, such as an X-ray or CT scan. A surgical procedure that uses a scope to check for adhesions inside your body. How is this treated? Treatment for this condition depends on where the adhesions are located and the symptoms they are causing. If there are no symptoms, treatment may not be needed. If there are symptoms, treatment may include: Taking medicines to help with symptoms. Having surgery to remove the adhesions. Follow these instructions at home: Take over-the-counter and prescription medicines only as told by your health care provider. If you were prescribed an antibiotic medicine, take it as told by your health care provider. Donot stop using the antibiotic even if you start to feel better. Follow any diet instructions your health care provider gives you. Your health care provider may recommend a liquid or low-fiber diet in  specific cases. Keep all follow-up visits. This is important. Contact a health care provider if you have: A fever or chills. Pain in the abdomen or pelvis. Vomiting or bloating. Swelling in the abdomen. Loud bowel sounds. Constipation. Get help right away if: You have severe pain in your abdomen or pelvis, and the pain is getting worse. You have vomiting that will not go away. You cannot pass gas. You are unable to have a bowel movement. These symptoms may represent a serious  problem that is an emergency. Do not wait to see if the symptoms will go away. Get medical help right away. Call your local emergency services (911 in the [place] Do not drive yourself to the hospital. Summary Adhesions are stringy (fibrous) bands of tissue that stick together. Adhesions can cause problems if they pull tissues or organs out of their normal positions. Symptoms include pain, bloating, vomiting, constipation, diarrhea, pain during sex, and difficulty getting pregnant. Treatment for this condition includes medicines and surgery. Take over-the-counter and prescription medicines only as told by your health care provider. This information is not intended to replace advice given to you by your health care provider. Make sure you discuss any questions you have with your health care provider. Document Revised: 09/28/2019 Document Reviewed: 09/28/2019 Elsevier Patient Education  2022 [employer].

## 2021-02-04 ENCOUNTER — Other Ambulatory Visit: Payer: Self-pay | Admitting: Internal Medicine

## 2021-02-11 ENCOUNTER — Other Ambulatory Visit: Payer: Self-pay

## 2021-02-11 ENCOUNTER — Ambulatory Visit: Payer: BC Managed Care – PPO | Admitting: Nurse Practitioner

## 2021-02-11 ENCOUNTER — Ambulatory Visit (INDEPENDENT_AMBULATORY_CARE_PROVIDER_SITE_OTHER): Payer: BC Managed Care – PPO | Admitting: Nurse Practitioner

## 2021-02-11 ENCOUNTER — Encounter: Payer: Self-pay | Admitting: Nurse Practitioner

## 2021-02-11 VITALS — Temp 100.0°F

## 2021-02-11 DIAGNOSIS — E782 Mixed hyperlipidemia: Secondary | ICD-10-CM | POA: Diagnosis not present

## 2021-02-11 DIAGNOSIS — F419 Anxiety disorder, unspecified: Secondary | ICD-10-CM | POA: Diagnosis not present

## 2021-02-11 DIAGNOSIS — U071 COVID-19: Secondary | ICD-10-CM | POA: Diagnosis not present

## 2021-02-11 MED ORDER — ROSUVASTATIN CALCIUM 5 MG PO TABS: ORAL_TABLET | ORAL | 2 refills | Status: DC

## 2021-02-11 MED ORDER — DEXAMETHASONE 1 MG PO TABS: ORAL_TABLET | ORAL | 0 refills | Status: DC

## 2021-02-11 MED ORDER — AZITHROMYCIN 250 MG PO TABS: ORAL_TABLET | ORAL | 1 refills | Status: DC

## 2021-02-11 MED ORDER — MOLNUPIRAVIR EUA 200MG CAPSULE: 4.0000 | ORAL_CAPSULE | Freq: Two times a day (BID) | ORAL | 0 refills | Status: AC

## 2021-02-11 MED ORDER — PROMETHAZINE-DM 6.25-15 MG/5ML PO SYRP: 5.0000 mL | ORAL_SOLUTION | Freq: Four times a day (QID) | ORAL | 1 refills | Status: DC | PRN

## 2021-02-11 MED ORDER — ALPRAZOLAM 0.5 MG PO TABS: ORAL_TABLET | ORAL | 0 refills | Status: DC

## 2021-02-11 NOTE — Progress Notes (Signed)
THIS ENCOUNTER IS A VIRTUAL VISIT DUE TO COVID-19 - PATIENT WAS NOT SEEN IN THE OFFICE.  PATIENT HAS CONSENTED TO VIRTUAL VISIT / TELEMEDICINE VISIT   Virtual Visit via telephone Note  I connected with  Dillon [name] on 02/11/2021 by telephone.  I verified that I am speaking with the correct person using two identifiers.    I discussed the limitations of evaluation and management by telemedicine and the availability of in person appointments. The patient expressed understanding and agreed to proceed.  History of Present Illness:  Temp 100 F (37.8 C)  66 y.o. patient contacted office reporting URI sx . he tested positive by home test on 02/11/21. OV was conducted by telephone to minimize exposure. This patient was vaccinated for covid 19, last 01/2021  Sx began 2 days ago with Congestion and cough with sore throat. Cough is productive of brown tinged mucus. Does have muscle aches and fatigue.  Has a low grade fever and chills   Treatments tried so far: Tylenol and Mucinex  Exposures: son-in-law   Medications   Current Outpatient Medications (Cardiovascular):    rosuvastatin (CRESTOR) 5 MG tablet, Take 1 tablet 3 x / week    on Mon-Wed- Fridays    for Cholesterol   Current Outpatient Medications (Analgesics):    meloxicam (MOBIC) 7.5 MG tablet, TAKE 1 TABLET(7.5 MG) AS NEEDED FOR BACK PAIN   Current Outpatient Medications (Other):    ALPRAZolam (XANAX) 0.5 MG tablet, Take 1/2 to 1 tablet 2 to 3 x /day as needed for Anxiety   B Complex-C (B-COMPLEX WITH VITAMIN C) tablet, Take 1 tablet by mouth daily.   Cholecalciferol (VITAMIN D PO), Take 200 Units by mouth daily.    diphenoxylate-atropine (LOMOTIL) 2.5-0.025 MG tablet, Take 1 to 2 tablets up to 4 x /day for Diarrhea (max 8 tablets /day)  Allergies:  Allergies  Allergen Reactions   Codeine Nausea And Vomiting   Norco [Hydrocodone-Acetaminophen]    Penicillins Nausea And Vomiting    Problem list He has DJD  (degenerative joint disease); History of prediabetes; Ulcerative colitis ([place]); Mixed hyperlipidemia; Elevated blood pressure reading without diagnosis of hypertension; Vitamin D deficiency; Medication management; BMI 26.0-26.9,adult; and Generalized anxiety disorder on their problem list.   Social History:   reports that he has never smoked. He has never used smokeless tobacco. He reports current alcohol use. He reports that he does not use drugs.  Observations/Objective:  General : Well sounding patient in no apparent distress HEENT: no hoarseness, no cough for duration of visit Lungs: speaks in complete sentences, no audible wheezing, no apparent distress Neurological: alert, oriented x 3 Psychiatric: pleasant, judgement appropriate   Assessment and Plan: Diagnoses and all orders for this visit:  COVID-19 -     molnupiravir EUA (LAGEVRIO) 200 mg CAPS capsule; Take 4 capsules (800 mg total) by mouth 2 (two) times daily for 5 days. -     dexamethasone (DECADRON) 1 MG tablet; Take 3 tabs for 3 days, 2 tabs for 3 days 1 tab for 5 days. Take with food. -     azithromycin (ZITHROMAX) 250 MG tablet; Take 2 tablets (500 mg) on  Day 1,  followed by 1 tablet (250 mg) once daily on Days 2 through 5. -     promethazine-dextromethorphan (PROMETHAZINE-DM) 6.25-15 MG/5ML syrup; Take 5 mLs by mouth 4 (four) times daily as needed for cough.  Mixed hyperlipidemia -     rosuvastatin (CRESTOR) 5 MG tablet; Take 1 tablet 3 x /  week    on Mon-Wed- Fridays    for Cholesterol    Covid 19 Covid 19 positive per rapid screening test art home today Risk factors include: colon removal, colostomy reversal 2001 Symptoms are: moderate Due to co morbid conditions and risk factors, discussed antivirals  Immue support reviewed  Take tylenol PRN temp 101+ Push hydration Regular ambulation or calf exercises exercises for clot prevention and 81 mg ASA unless contraindicated Sx supportive therapy suggested Follow up  via mychart or telephone if needed Advised patient obtain O2 monitor; present to ED if persistently <90% or with severe dyspnea, CP, fever uncontrolled by tylenol, confusion, sudden decline Should remain in isolation until at least 5 days from onset of sx, 24-48 hours fever free without tylenol, sx such as cough are improved.      Follow Up Instructions:  I discussed the assessment and treatment plan with the patient. The patient was provided an opportunity to ask questions and all were answered. The patient agreed with the plan and demonstrated an understanding of the instructions.   The patient was advised to call back or seek an in-person evaluation if the symptoms worsen or if the condition fails to improve as anticipated.  I provided 20 minutes of non-face-to-face time during this encounter.   Magda Bernheim, NP

## 2021-03-10 NOTE — Progress Notes (Signed)
Assessment and Plan:  Dillon [name] was seen today for acute visit.  Diagnoses and all orders for this visit:  Pain of upper abdomen/Incisional hernia, without obstruction or gangrene -     US Abdomen Complete; Future Did not keep surgery consult appointment If Ultrasound is normal will refer to GI for further evaluation Monitor symptoms, if pain becomes severe, fever, nausea, vomiting he is to go to the ER  Palpitations EKG normal today Monitor symptoms to go to the ER if any chest pain, shortness of breath, nausea, dizziness, severe HA, changes vision/speech  -     EKG 12-Lead -     TSH  Mixed hyperlipidemia -     CBC with Differential/Platelet -     COMPLETE METABOLIC PANEL WITH GFR -     Lipid panel -     TSH  Chronic anxiety Discussed relaxation techniques Continue Alprazolam as needed Was unable to tolerate any SSRI, SSNRI medications If symptoms worsen can refer to psych for evaluation and medication suggestions      Further disposition pending results of labs. Discussed med's effects and SE's.   Over 30 minutes of exam, counseling, chart review, and critical decision making was performed.   Future Appointments  Date Time Provider [place]  05/14/2021 11:00 AM [name], MD [facility] None    ------------------------------------------------------------------------------------------------------------------   HPI BP 118/66    Pulse 89    Temp (!) 97.5 F (36.4 C)    Wt 174 lb 6.4 oz (79.1 kg)    SpO2 97%    BMI 27.31 kg/m    65 y.o.male presents for flutter in the chest and some pressure and then will get pressure headache and dizziness. This has been present for several weeks since Covid. He also still getting occasional shortness of breath on exertion. Denies wheezing and coughing.  He does have possible scar tissue from colectomy and was scheduled to see a surgeon for evaluation but backed out. He is having more pressure and tenderness in the upper  abdominal area.  He does have anxiety. He currently uses Xanax as needed but rarely. He has tried many drugs in the past- buspar, lexapro, prozac, cymbalta, zoloft, effexor and trintellix- they all caused brain fog.   BP is currently well controlled without medication BP Readings from Last 3 Encounters:  03/11/21 118/66  01/29/21 130/70  01/06/21 118/64     Cholesterol is currently controlled without medication Lab Results  Component Value Date   CHOL 156 10/22/2020   HDL 66 10/22/2020   LDLCALC 63 10/22/2020   TRIG 205 (H) 10/22/2020   CHOLHDL 2.4 10/22/2020    Past Medical History:  Diagnosis Date   DJD (degenerative joint disease)    Elevated hemoglobin A1c    Hyperlipidemia    Hypertension      Allergies  Allergen Reactions   Codeine Nausea And Vomiting   Norco [Hydrocodone-Acetaminophen]    Penicillins Nausea And Vomiting    Current Outpatient Medications on File Prior to Visit  Medication Sig   acetaminophen (TYLENOL) 500 MG tablet Take 500 mg by mouth every 6 (six) hours as needed.   B Complex-C (B-COMPLEX WITH VITAMIN C) tablet Take 1 tablet by mouth daily.   Cholecalciferol (VITAMIN D PO) Take 200 Units by mouth daily.    diphenoxylate-atropine (LOMOTIL) 2.5-0.025 MG tablet Take 1 to 2 tablets up to 4 x /day for Diarrhea (max 8 tablets /day)   meloxicam (MOBIC) 7.5 MG tablet TAKE 1 TABLET(7.5 MG) AS NEEDED FOR  BACK PAIN   azithromycin (ZITHROMAX) 250 MG tablet Take 2 tablets (500 mg) on  Day 1,  followed by 1 tablet (250 mg) once daily on Days 2 through 5. (Patient not taking: Reported on 03/11/2021)   dexamethasone (DECADRON) 1 MG tablet Take 3 tabs for 3 days, 2 tabs for 3 days 1 tab for 5 days. Take with food. (Patient not taking: Reported on 03/11/2021)   guaiFENesin (MUCINEX PO) Take by mouth. (Patient not taking: Reported on 03/11/2021)   promethazine-dextromethorphan (PROMETHAZINE-DM) 6.25-15 MG/5ML syrup Take 5 mLs by mouth 4 (four) times daily as needed for  cough. (Patient not taking: Reported on 03/11/2021)   No current facility-administered medications on file prior to visit.    ROS: all negative except above.   Physical Exam:  BP 118/66    Pulse 89    Temp (!) 97.5 F (36.4 C)    Wt 174 lb 6.4 oz (79.1 kg)    SpO2 97%    BMI 27.31 kg/m   General Appearance: Well nourished, in no apparent distress. Eyes: PERRLA, EOMs, conjunctiva no swelling or erythema Sinuses: No Frontal/maxillary tenderness ENT/Mouth: Ext aud canals clear, TMs without erythema, bulging. No erythema, swelling, or exudate on post pharynx.  Tonsils not swollen or erythematous. Hearing normal.  Neck: Supple, thyroid normal.  Respiratory: Respiratory effort normal, BS equal bilaterally without rales, rhonchi, wheezing or stridor.  Cardio: RRR with no MRGs. Brisk peripheral pulses without edema.  Abdomen: Soft, + BS.  Tenderness right upper quadrant. Large incisional hernia noted Lymphatics: Non tender without lymphadenopathy.  Musculoskeletal: Full ROM, 5/5 strength, normal gait.  Skin: Warm, dry without rashes, lesions, ecchymosis.  Neuro: Cranial nerves intact. Normal muscle tone, no cerebellar symptoms. Sensation intact.  Psych: Awake and oriented X 3, normal affect, Insight and Judgment appropriate.     Magda Bernheim, NP 11:00 AM [facility]

## 2021-03-11 ENCOUNTER — Ambulatory Visit (INDEPENDENT_AMBULATORY_CARE_PROVIDER_SITE_OTHER): Payer: BC Managed Care – PPO | Admitting: Nurse Practitioner

## 2021-03-11 ENCOUNTER — Other Ambulatory Visit: Payer: Self-pay

## 2021-03-11 ENCOUNTER — Encounter: Payer: Self-pay | Admitting: Nurse Practitioner

## 2021-03-11 VITALS — BP 118/66 | HR 89 | Temp 97.5°F | Wt 174.4 lb

## 2021-03-11 DIAGNOSIS — K432 Incisional hernia without obstruction or gangrene: Secondary | ICD-10-CM | POA: Diagnosis not present

## 2021-03-11 DIAGNOSIS — F419 Anxiety disorder, unspecified: Secondary | ICD-10-CM

## 2021-03-11 DIAGNOSIS — R1010 Upper abdominal pain, unspecified: Secondary | ICD-10-CM | POA: Diagnosis not present

## 2021-03-11 DIAGNOSIS — R002 Palpitations: Secondary | ICD-10-CM | POA: Diagnosis not present

## 2021-03-11 DIAGNOSIS — E782 Mixed hyperlipidemia: Secondary | ICD-10-CM

## 2021-03-11 NOTE — Patient Instructions (Addendum)
YOU CAN CALL TO MAKE AN ULTRASOUND..  I have put in an order for an ultrasound for you to have You can set them up at your convenience by calling this number [phone] You will likely have the ultrasound at [address]  If you have any issues call our office and we will set this up for you.    Palpitations Palpitations are feelings that your heartbeat is irregular or is faster than normal. It may feel like your heart is fluttering or skipping a beat. Palpitations may be caused by many things, including smoking, caffeine, alcohol, stress, and certain medicines or drugs. Most causes of palpitations are not serious.  However, some palpitations can be a sign of a serious problem. Further tests and a thorough medical history will be done to find the cause of your palpitations. Your provider may order tests such as an ECG, labs, an echocardiogram, or an ambulatory continuous ECG monitor. Follow these instructions at home: Pay attention to any changes in your symptoms. Let your health care provider know about them. Take these actions to help manage your symptoms: Eating and drinking Follow instructions from your health care provider about eating or drinking restrictions. You may need to avoid foods and drinks that may cause palpitations. These may include: Caffeinated coffee, tea, soft drinks, and energy drinks. Chocolate. Alcohol. Diet pills. Lifestyle   Take steps to reduce your stress and anxiety. Things that can help you relax include: Yoga. Mind-body activities, such as deep breathing, meditation, or using words and images to create positive thoughts (guided imagery). Physical activity, such as swimming, jogging, or walking. Tell your health care provider if your palpitations increase with activity. If you have chest pain or shortness of breath with activity, do not continue the activity until you are seen by your health care provider. Biofeedback. This is a method that  helps you learn to use your mind to control things in your body, such as your heartbeat. Get plenty of rest and sleep. Keep a regular bed time. Do not use drugs, including cocaine or ecstasy. Do not use marijuana. Do not use any products that contain nicotine or tobacco. These products include cigarettes, chewing tobacco, and vaping devices, such as e-cigarettes. If you need help quitting, ask your health care provider. General instructions Take over-the-counter and prescription medicines only as told by your health care provider. Keep all follow-up visits. This is important. These may include visits for further testing if palpitations do not go away or get worse. Contact a health care provider if: You continue to have a fast or irregular heartbeat for a long period of time. You notice that your palpitations occur more often. Get help right away if: You have chest pain or shortness of breath. You have a severe headache. You feel dizzy or you faint. These symptoms may represent a serious problem that is an emergency. Do not wait to see if the symptoms will go away. Get medical help right away. Call your local emergency services (911 in the [place] Do not drive yourself to the hospital. Summary Palpitations are feelings that your heartbeat is irregular or is faster than normal. It may feel like your heart is fluttering or skipping a beat. Palpitations may be caused by many things, including smoking, caffeine, alcohol, stress, certain medicines, and drugs. Further tests and a thorough medical history may be done to find the cause of your palpitations. Get help right away if you faint or have chest  pain, shortness of breath, severe headache, or dizziness. This information is not intended to replace advice given to you by your health care provider. Make sure you discuss any questions you have with your health care provider. Document Revised: 06/11/2020 Document Reviewed: 06/11/2020 Elsevier Patient  Education  [place].

## 2021-03-12 LAB — COMPLETE METABOLIC PANEL WITH GFR
AG Ratio: 2 (calc) (ref 1.0–2.5)
ALT: 25 U/L (ref 9–46)
AST: 22 U/L (ref 10–35)
Albumin: 4.5 g/dL (ref 3.6–5.1)
Alkaline phosphatase (APISO): 62 U/L (ref 35–144)
BUN: 10 mg/dL (ref 7–25)
CO2: 26 mmol/L (ref 20–32)
Calcium: 9.5 mg/dL (ref 8.6–10.3)
Chloride: 104 mmol/L (ref 98–110)
Creat: 0.75 mg/dL (ref 0.70–1.35)
Globulin: 2.3 g/dL (calc) (ref 1.9–3.7)
Glucose, Bld: 97 mg/dL (ref 65–99)
Potassium: 3.9 mmol/L (ref 3.5–5.3)
Sodium: 139 mmol/L (ref 135–146)
Total Bilirubin: 0.5 mg/dL (ref 0.2–1.2)
Total Protein: 6.8 g/dL (ref 6.1–8.1)
eGFR: 100 mL/min/{1.73_m2} (ref >=60)

## 2021-03-12 LAB — TSH: TSH: 1.45 mIU/L (ref 0.40–4.50)

## 2021-03-12 LAB — CBC WITH DIFFERENTIAL/PLATELET
Absolute Monocytes: 478 cells/uL (ref 200–950)
Basophils Absolute: 42 cells/uL (ref 0–200)
Basophils Relative: 0.8 %
Eosinophils Absolute: 177 cells/uL (ref 15–500)
Eosinophils Relative: 3.4 %
HCT: 42.5 % (ref 38.5–50.0)
Hemoglobin: 14.4 g/dL (ref 13.2–17.1)
Lymphs Abs: 1518 cells/uL (ref 850–3900)
MCH: 30.7 pg (ref 27.0–33.0)
MCHC: 33.9 g/dL (ref 32.0–36.0)
MCV: 90.6 fL (ref 80.0–100.0)
MPV: 10.3 fL (ref 7.5–12.5)
Monocytes Relative: 9.2 %
Neutro Abs: 2985 cells/uL (ref 1500–7800)
Neutrophils Relative %: 57.4 %
Platelets: 269 10*3/uL (ref 140–400)
RBC: 4.69 10*6/uL (ref 4.20–5.80)
RDW: 13.5 % (ref 11.0–15.0)
Total Lymphocyte: 29.2 %
WBC: 5.2 10*3/uL (ref 3.8–10.8)

## 2021-03-12 LAB — LIPID PANEL
Cholesterol: 185 mg/dL (ref <200)
HDL: 64 mg/dL (ref >=40)
LDL Cholesterol (Calc): 87 mg/dL (calc)
Non-HDL Cholesterol (Calc): 121 mg/dL (calc) (ref <130)
Total CHOL/HDL Ratio: 2.9 (calc) (ref <5.0)
Triglycerides: 244 mg/dL — ABNORMAL HIGH (ref <150)

## 2021-03-12 NOTE — Progress Notes (Signed)
Patient is aware of lab results and instructions. -e welch

## 2021-03-19 ENCOUNTER — Ambulatory Visit
Admission: RE | Admit: 2021-03-19 | Discharge: 2021-03-19 | Disposition: A | Discharge status: Home / Self Care | Payer: BC Managed Care – PPO | Source: Ambulatory Visit | Attending: Nurse Practitioner | Admitting: Nurse Practitioner

## 2021-03-19 ENCOUNTER — Other Ambulatory Visit: Payer: Self-pay

## 2021-03-19 DIAGNOSIS — R1010 Upper abdominal pain, unspecified: Secondary | ICD-10-CM

## 2021-03-26 NOTE — Progress Notes (Signed)
Assessment and Plan:  Dillon [name] was seen today for acute visit.  Diagnoses and all orders for this visit:  Chronic anxiety Continue relaxation techniques  Continue Alprazolam PRN Will retry Celexa, if does well will continue on this medication If unable to tolerate Celexa will refer to Psychiatry for further evaluation -     citalopram (CELEXA) 10 MG tablet; Take 1 tablet (10 mg total) by mouth daily.  Abdominal Pain States pain has resolved unless he does heavy lifting Does not wish to pursue surgery consult at present Continue to monitor     Further disposition pending results of labs. Discussed med's effects and SE's.   Over 30 minutes of exam, counseling, chart review, and critical decision making was performed.   Future Appointments  Date Time Provider [place]  05/14/2021 11:00 AM [name], MD [facility] None    ------------------------------------------------------------------------------------------------------------------   HPI BP 130/68    Pulse 83    Temp (!) 97.5 F (36.4 C)    Wt 177 lb 3.2 oz (80.4 kg)    SpO2 98%    BMI 27.75 kg/m   66 y.o.male presents for having increased anxiety  Is worrying much more frequently. He is retiring at the end of the month.  He uses Alprazolam occasionally. He does have a history of trials with many antidepressants and anxiolytics including: Alprazolam, Buspar, Celexa, Cymbalta, Effexor, Lexapro and Trintellix. He was unable to tolerate the medications but believes he tolerated Celexa the best.   Past Medical History:  Diagnosis Date   DJD (degenerative joint disease)    Elevated hemoglobin A1c    Hyperlipidemia    Hypertension      Allergies  Allergen Reactions   Codeine Nausea And Vomiting   Norco [Hydrocodone-Acetaminophen]    Penicillins Nausea And Vomiting    Current Outpatient Medications on File Prior to Visit  Medication Sig   acetaminophen (TYLENOL) 500 MG tablet Take 500 mg by mouth every 6  (six) hours as needed.   ALPRAZolam (XANAX) 0.25 MG tablet Take 0.25 mg by mouth at bedtime as needed for anxiety.   B Complex-C (B-COMPLEX WITH VITAMIN C) tablet Take 1 tablet by mouth daily.   Cholecalciferol (VITAMIN D PO) Take 200 Units by mouth daily.    diphenoxylate-atropine (LOMOTIL) 2.5-0.025 MG tablet Take 1 to 2 tablets up to 4 x /day for Diarrhea (max 8 tablets /day)   meloxicam (MOBIC) 7.5 MG tablet TAKE 1 TABLET(7.5 MG) AS NEEDED FOR BACK PAIN   Multiple Vitamin (MULTIVITAMIN) tablet Take 1 tablet by mouth daily.   No current facility-administered medications on file prior to visit.    ROS: all negative except above.   Physical Exam:  BP 130/68    Pulse 83    Temp (!) 97.5 F (36.4 C)    Wt 177 lb 3.2 oz (80.4 kg)    SpO2 98%    BMI 27.75 kg/m   General Appearance: Well nourished, in no apparent distress. Eyes: PERRLA, EOMs, conjunctiva no swelling or erythema Sinuses: No Frontal/maxillary tenderness ENT/Mouth: Ext aud canals clear, TMs without erythema, bulging. No erythema, swelling, or exudate on post pharynx.  Tonsils not swollen or erythematous. Hearing normal.  Neck: Supple, thyroid normal.  Respiratory: Respiratory effort normal, BS equal bilaterally without rales, rhonchi, wheezing or stridor.  Cardio: RRR with no MRGs. Brisk peripheral pulses without edema.  Abdomen: Soft, + BS.  Non tender, no guarding, rebound, masses. Lymphatics: Non tender without lymphadenopathy.  Musculoskeletal: Full ROM, 5/5 strength, normal gait.  Skin: Warm, dry without rashes, lesions, ecchymosis.  Neuro: Cranial nerves intact. Normal muscle tone, no cerebellar symptoms. Sensation intact.  Psych: Awake and oriented X 3, normal affect, Insight and Judgment appropriate.     Magda Bernheim, NP 2:43 PM [facility]

## 2021-03-30 ENCOUNTER — Other Ambulatory Visit: Payer: Self-pay

## 2021-03-30 ENCOUNTER — Ambulatory Visit (INDEPENDENT_AMBULATORY_CARE_PROVIDER_SITE_OTHER): Payer: BC Managed Care – PPO | Admitting: Nurse Practitioner

## 2021-03-30 ENCOUNTER — Encounter: Payer: Self-pay | Admitting: Nurse Practitioner

## 2021-03-30 VITALS — BP 130/68 | HR 83 | Temp 97.5°F | Wt 177.2 lb

## 2021-03-30 DIAGNOSIS — F419 Anxiety disorder, unspecified: Secondary | ICD-10-CM

## 2021-03-30 DIAGNOSIS — R109 Unspecified abdominal pain: Secondary | ICD-10-CM | POA: Diagnosis not present

## 2021-03-30 MED ORDER — CITALOPRAM HYDROBROMIDE 10 MG PO TABS: 10.0000 mg | ORAL_TABLET | Freq: Every day | ORAL | 2 refills | Status: DC

## 2021-04-23 ENCOUNTER — Other Ambulatory Visit: Payer: Self-pay | Admitting: Adult Health

## 2021-04-23 DIAGNOSIS — F419 Anxiety disorder, unspecified: Secondary | ICD-10-CM

## 2021-05-06 ENCOUNTER — Encounter: Payer: Self-pay | Admitting: Internal Medicine

## 2021-05-14 ENCOUNTER — Encounter: Payer: BC Managed Care – PPO | Admitting: Internal Medicine

## 2021-07-08 ENCOUNTER — Encounter: Payer: Medicare PPO | Admitting: Internal Medicine

## 2021-08-19 ENCOUNTER — Encounter: Payer: Self-pay | Admitting: Internal Medicine

## 2021-08-19 NOTE — Patient Instructions (Signed)

## 2021-08-19 NOTE — Progress Notes (Signed)
Future Appointments  Date Time Provider Department  08/20/2021 11:00 AM [name], MD [facility]  08/24/2022  2:00 PM [name], MD [facility]   Annual  Screening/Preventative Visit  & Comprehensive Evaluation & Examination                                             This very nice 66 y.o. MWM presents for a Screening /Preventative Visit & comprehensive evaluation and management of multiple medical co-morbidities.  Patient has been followed for labile HTN, HLD, Prediabetes and Vitamin D Deficiency.                                      Patient has hx/o Ulcerative Colitis  s/p  Total Colectomy in 2000 and has 8-10 loose BM's daily. ( Patient had initial Total colectomy w/colostomy & subsequent reversal of colostomy)       HTN predates since  2013.  Patient's BP has been controlled at home.  Today's BP is 139/82 . Patient denies any cardiac symptoms as chest pain, palpitations, shortness of breath, dizziness or ankle swelling.      Patient's hyperlipidemia is controlled with diet and medications. Patient denies myalgias or other medication SE's. Last lipids were at goal except elevated Trig's:  Lab Results  Component Value Date   CHOL 185 03/11/2021   HDL 64 03/11/2021   LDLCALC 87 03/11/2021   TRIG 244 (H) 03/11/2021   CHOLHDL 2.9 03/11/2021        Patient has hx/o prediabetes  (A1c 5.7% /2013 and 5.8% /2016) and patient denies reactive hypoglycemic symptoms, visual blurring, diabetic polys or paresthesias. Last A1c was normal & at goal:  Lab Results  Component Value Date   HGBA1C 5.5 10/22/2020        Finally, patient has history of Vitamin D Deficiency ("37" /2016) and last vitamin D was still low.  Lab Results  Component Value Date   VD25OH 43 05/01/2020       Current Outpatient Medications on File Prior to Visit  Medication Sig   ALPRAZolam  0.5 MG  Take 1/2 to 1 tablet 2 to 3 x /day as needed for Anxiety   B Complex-C tablet Take 1 tablet   daily.   VITAMIN D  Take 200 Units  daily.    Lomotil 2.5-0.025 MG  Take 1 -2 tabs up to 4 x /day for Diarrhea (max 8 tablets /day)   meloxicam 7.5 MG  TAKE 1 TABLETAS NEEDED FOR BACK PAIN   rosuvastatin 5 MG  Take 1 tab 3 days/week (MWF)     Allergies  Allergen Reactions   Codeine Nausea And Vomiting   Norco [Hydrocodone-Acetaminophen]    Penicillins Nausea And Vomiting    Past Medical History:  Diagnosis Date   DJD (degenerative joint disease)    Elevated hemoglobin A1c    Hyperlipidemia    Hypertension     Health Maintenance  Topic Date Due   COLONOSCOPY Never done   INFLUENZA VACCINE  09/02/2019   COVID-19 Vaccine (4 - Booster for [place] series) 06/23/2020   TETANUS/TDAP  02/07/2023   Hepatitis C Screening  Completed   HIV Screening  Completed   HPV VACCINES  Aged Out   Last Colon - 2000  Past Surgical History:  Procedure  Laterality Date   COLON SURGERY  2000, reversal 2001   coloectomy, with reversal and now a J pouch   ELBOW LIGAMENT RECONSTRUCTION Right July 2014   Dr Apolonio Schneiders   ELBOW LIGAMENT RECONSTRUCTION Left Sept 2016   Dr Apolonio Schneiders   ELBOW SURGERY Right 2014   HAND SURGERY Right 1975   HAND SURGERY Right 1975    Family History  Problem Relation Age of Onset   Diabetes Mother    Arthritis Mother        Rheumatoid   Heart disease Father    Gout Father    Cancer Father        melanoma   Cancer Sister        Breast    Social History   Socioeconomic History   Marital status: Married    Spouse name: Mechele Claude   Number of children: 1 son died of a Drug OD & a 2sd son has been followed at drug Rehab  & 1 daughter  Occupational History    Personal assistant for [employer]    Tobacco Use   Smoking status: Never Smoker   Smokeless tobacco: Never Used  Substance and Sexual Activity   Alcohol use: Yes    Comment: occa smixed drink   Drug use: No   Sexual activity: Yes    ROS Constitutional: Denies fever, chills, weight loss/gain, headaches,  insomnia,  night sweats or change in appetite. Does c/o fatigue. Eyes: Denies redness, blurred vision, diplopia, discharge, itchy or watery eyes.  ENT: Denies discharge, congestion, post nasal drip, epistaxis, sore throat, earache, hearing loss, dental pain, Tinnitus, Vertigo, Sinus pain or snoring.  Cardio: Denies chest pain, palpitations, irregular heartbeat, syncope, dyspnea, diaphoresis, orthopnea, PND, claudication or edema Respiratory: denies cough, dyspnea, DOE, pleurisy, hoarseness, laryngitis or wheezing.  Gastrointestinal: Denies dysphagia, heartburn, reflux, water brash, pain, cramps, nausea, vomiting, bloating, diarrhea, constipation, hematemesis, melena, hematochezia, jaundice or hemorrhoids Genitourinary: Denies dysuria, frequency,  discharge, hematuria or flank pain. Has urgency, nocturia x 2-3 & occasional hesitancy. Musculoskeletal: Denies arthralgia, myalgia, stiffness, Jt. Swelling, pain, limp or strain/sprain. Denies Falls. Skin: Denies puritis, rash, hives, warts, acne, eczema or change in skin lesion Neuro: No weakness, tremor, incoordination, spasms, paresthesia or pain Psychiatric: Denies confusion, memory loss or sensory loss. Denies Depression. Endocrine: Denies change in weight, skin, hair change, nocturia, and paresthesia, diabetic polys, visual blurring or hyper / hypo glycemic episodes.  Heme/Lymph: No excessive bleeding, bruising or enlarged lymph nodes.  Physical Exam  BP 139/82   Pulse 96   Temp (!) 96.6 F (35.9 C)   Resp 16   Ht 5' 7.5" (1.715 m)   Wt 172 lb (78 kg)   SpO2 99%   BMI 26.54 kg/m   General Appearance: Well nourished and well groomed and in no apparent distress.  Eyes: PERRLA, EOMs, conjunctiva no swelling or erythema, normal fundi and vessels. Sinuses: No frontal/maxillary tenderness ENT/Mouth: EACs patent / TMs  nl. Nares clear without erythema, swelling, mucoid exudates. Oral hygiene is good. No erythema, swelling, or exudate. Tongue  normal, non-obstructing. Tonsils not swollen or erythematous. Hearing normal.  Neck: Supple, thyroid not palpable. No bruits, nodes or JVD. Respiratory: Respiratory effort normal.  BS equal and clear bilateral without rales, rhonci, wheezing or stridor. Cardio: Heart sounds are normal with regular rate and rhythm and no murmurs, rubs or gallops. Peripheral pulses are normal and equal bilaterally without edema. No aortic or femoral bruits. Chest: symmetric with normal excursions and percussion.  Abdomen: Soft,  with Nl bowel sounds. Nontender, no guarding, rebound, hernias, masses, or organomegaly.  Lymphatics: Non tender without lymphadenopathy.  Musculoskeletal: Full ROM all peripheral extremities, joint stability, 5/5 strength, and normal gait. Skin: Warm and dry without rashes, lesions, cyanosis, clubbing or  ecchymosis.  Neuro: Cranial nerves intact, reflexes equal bilaterally. Normal muscle tone, no cerebellar symptoms. Sensation intact.  Pysch: Alert and oriented x 3 with normal affect, insight and judgment appropriate.   Assessment and Plan  1. Annual Preventative/Screening Exam    2. Labile hypertension  - EKG 12-Lead - [place], RETROPERITNL ABD,  LTD - CBC with Differential/Platelet - COMPLETE METABOLIC PANEL WITH GFR - Magnesium - TSH  3. Hyperlipidemia, mixed  - EKG 12-Lead - [place], RETROPERITNL ABD,  LTD - Lipid panel - TSH  4. Abnormal glucose  - EKG 12-Lead - Hemoglobin A1c - Insulin, random  5. Vitamin D deficiency  - VITAMIN D 25 Hydroxy (  6. Ulcerative colitis ([place])  - CBC with Differential/Platelet - COMPLETE METABOLIC PANEL WITH GFR  7. BPH with obstruction/lower urinary tract symptoms  - PSA  8. Prostate cancer screening  - PSA  9. Screening for colorectal cancer  - POC Hemoccult Bld/Stl   10. Screening for heart disease  - EKG 12-Lead  11. FHx: heart disease  - EKG 12-Lead - [place], RETROPERITNL ABD,  LTD  12. Screening for AAA (aortic  abdominal aneurysm)  - [place], RETROPERITNL ABD,  LTD  13. Medication management  - Urinalysis, Routine w reflex microscopic - Microalbumin / creatinine urine ratio - CBC with Differential/Platelet - COMPLETE METABOLIC PANEL WITH GFR - Magnesium - Lipid panel - TSH - Hemoglobin A1c - Insulin, random - VITAMIN D 25 Hydroxy         Patient was counseled in prudent diet, weight control to achieve/maintain BMI less than 25, BP monitoring, regular exercise and medications as discussed.  Discussed med effects and SE's.  Recommended try Tumeric & Cinnamon for pain in hands/fingers.  Rx Cymbalta for his mood & chronic pain in hands. Routine screening labs and tests as requested with regular follow-up as recommended. Over 40 minutes of exam, counseling, chart review and high complex critical decision making was performed   Kirtland Bouchard, MD

## 2021-08-20 ENCOUNTER — Ambulatory Visit (INDEPENDENT_AMBULATORY_CARE_PROVIDER_SITE_OTHER): Payer: Medicare PPO | Admitting: Internal Medicine

## 2021-08-20 ENCOUNTER — Encounter: Payer: Self-pay | Admitting: Internal Medicine

## 2021-08-20 VITALS — BP 139/82 | HR 96 | Temp 96.6°F | Resp 16 | Ht 67.5 in | Wt 172.0 lb

## 2021-08-20 DIAGNOSIS — I700 Atherosclerosis of aorta: Secondary | ICD-10-CM

## 2021-08-20 DIAGNOSIS — Z125 Encounter for screening for malignant neoplasm of prostate: Secondary | ICD-10-CM

## 2021-08-20 DIAGNOSIS — Z136 Encounter for screening for cardiovascular disorders: Secondary | ICD-10-CM

## 2021-08-20 DIAGNOSIS — N138 Other obstructive and reflux uropathy: Secondary | ICD-10-CM

## 2021-08-20 DIAGNOSIS — Z79899 Other long term (current) drug therapy: Secondary | ICD-10-CM

## 2021-08-20 DIAGNOSIS — R0989 Other specified symptoms and signs involving the circulatory and respiratory systems: Secondary | ICD-10-CM

## 2021-08-20 DIAGNOSIS — Z0000 Encounter for general adult medical examination without abnormal findings: Secondary | ICD-10-CM | POA: Diagnosis not present

## 2021-08-20 DIAGNOSIS — Z8249 Family history of ischemic heart disease and other diseases of the circulatory system: Secondary | ICD-10-CM

## 2021-08-20 DIAGNOSIS — Z0001 Encounter for general adult medical examination with abnormal findings: Secondary | ICD-10-CM

## 2021-08-20 DIAGNOSIS — R7309 Other abnormal glucose: Secondary | ICD-10-CM

## 2021-08-20 DIAGNOSIS — E782 Mixed hyperlipidemia: Secondary | ICD-10-CM

## 2021-08-20 DIAGNOSIS — K5190 Ulcerative colitis, unspecified, without complications: Secondary | ICD-10-CM

## 2021-08-20 DIAGNOSIS — Z1211 Encounter for screening for malignant neoplasm of colon: Secondary | ICD-10-CM

## 2021-08-20 DIAGNOSIS — E559 Vitamin D deficiency, unspecified: Secondary | ICD-10-CM

## 2021-08-20 MED ORDER — BUSPIRONE HCL 10 MG PO TABS: ORAL_TABLET | ORAL | 1 refills | Status: DC

## 2021-08-21 LAB — MICROALBUMIN / CREATININE URINE RATIO
Creatinine, Urine: 141 mg/dL (ref 20–320)
Microalb Creat Ratio: 2 mcg/mg creat (ref <30)
Microalb, Ur: 0.3 mg/dL

## 2021-08-21 LAB — LIPID PANEL
Cholesterol: 202 mg/dL — ABNORMAL HIGH (ref <200)
HDL: 72 mg/dL (ref >=40)
LDL Cholesterol (Calc): 86 mg/dL (calc)
Non-HDL Cholesterol (Calc): 130 mg/dL (calc) — ABNORMAL HIGH (ref <130)
Total CHOL/HDL Ratio: 2.8 (calc) (ref <5.0)
Triglycerides: 333 mg/dL — ABNORMAL HIGH (ref <150)

## 2021-08-21 LAB — URINALYSIS, ROUTINE W REFLEX MICROSCOPIC
Bilirubin Urine: NEGATIVE
Glucose, UA: NEGATIVE
Hgb urine dipstick: NEGATIVE
Ketones, ur: NEGATIVE
Leukocytes,Ua: NEGATIVE
Nitrite: NEGATIVE
Protein, ur: NEGATIVE
Specific Gravity, Urine: 1.013 (ref 1.001–1.035)
pH: 5.5 (ref 5.0–8.0)

## 2021-08-21 LAB — CBC WITH DIFFERENTIAL/PLATELET
Absolute Monocytes: 539 cells/uL (ref 200–950)
Basophils Absolute: 39 cells/uL (ref 0–200)
Basophils Relative: 0.5 %
Eosinophils Absolute: 169 cells/uL (ref 15–500)
Eosinophils Relative: 2.2 %
HCT: 46.2 % (ref 38.5–50.0)
Hemoglobin: 16 g/dL (ref 13.2–17.1)
Lymphs Abs: 2772 cells/uL (ref 850–3900)
MCH: 30.9 pg (ref 27.0–33.0)
MCHC: 34.6 g/dL (ref 32.0–36.0)
MCV: 89.2 fL (ref 80.0–100.0)
MPV: 10.4 fL (ref 7.5–12.5)
Monocytes Relative: 7 %
Neutro Abs: 4181 cells/uL (ref 1500–7800)
Neutrophils Relative %: 54.3 %
Platelets: 294 10*3/uL (ref 140–400)
RBC: 5.18 10*6/uL (ref 4.20–5.80)
RDW: 13.4 % (ref 11.0–15.0)
Total Lymphocyte: 36 %
WBC: 7.7 10*3/uL (ref 3.8–10.8)

## 2021-08-21 LAB — COMPLETE METABOLIC PANEL WITH GFR
AG Ratio: 1.5 (calc) (ref 1.0–2.5)
ALT: 28 U/L (ref 9–46)
AST: 32 U/L (ref 10–35)
Albumin: 4.5 g/dL (ref 3.6–5.1)
Alkaline phosphatase (APISO): 74 U/L (ref 35–144)
BUN: 14 mg/dL (ref 7–25)
CO2: 26 mmol/L (ref 20–32)
Calcium: 9.9 mg/dL (ref 8.6–10.3)
Chloride: 101 mmol/L (ref 98–110)
Creat: 0.98 mg/dL (ref 0.70–1.35)
Globulin: 3 g/dL (calc) (ref 1.9–3.7)
Glucose, Bld: 118 mg/dL — ABNORMAL HIGH (ref 65–99)
Potassium: 4 mmol/L (ref 3.5–5.3)
Sodium: 139 mmol/L (ref 135–146)
Total Bilirubin: 0.9 mg/dL (ref 0.2–1.2)
Total Protein: 7.5 g/dL (ref 6.1–8.1)
eGFR: 86 mL/min/{1.73_m2} (ref >=60)

## 2021-08-21 LAB — MAGNESIUM: Magnesium: 2.1 mg/dL (ref 1.5–2.5)

## 2021-08-21 LAB — TSH: TSH: 2.17 mIU/L (ref 0.40–4.50)

## 2021-08-21 LAB — HEMOGLOBIN A1C
Hgb A1c MFr Bld: 5.5 % of total Hgb (ref <5.7)
Mean Plasma Glucose: 111 mg/dL
eAG (mmol/L): 6.2 mmol/L

## 2021-08-21 LAB — INSULIN, RANDOM: Insulin: 18.4 u[IU]/mL

## 2021-08-21 LAB — PSA: PSA: 0.7 ng/mL (ref <=4.00)

## 2021-08-21 LAB — VITAMIN D 25 HYDROXY (VIT D DEFICIENCY, FRACTURES): Vit D, 25-Hydroxy: 35 ng/mL (ref 30–100)

## 2021-08-23 NOTE — Progress Notes (Signed)
<><><><><><><><><><><><><><><><><><><><><><><><><><><><><><><><><> <><><><><><><><><><><><><><><><><><><><><><><><><><><><><><><><><>  -    Total Chol = 202  is slightly elevated     Bad LDL Chol = 86  - Great                  ( Ideal or goal is less than 100 )   -           Excellent   - Very low risk for Heart Attack  / Stroke ============================================================ ============================================================  - But Triglycerides (   333   ) or fats in blood are too high                 (   Ideal or  Goal is less than 150  !  )    - Recommend avoid fried & greasy foods,  sweets / candy,   - Avoid white rice  (brown or wild rice or Quinoa is OK),   - Avoid white potatoes  (sweet potatoes are OK)   - Avoid anything made from white flour  - bagels, doughnuts, rolls, buns, biscuits, white and   wheat breads, pizza crust and traditional  pasta made of white flour & egg white  - (vegetarian pasta or spinach or wheat pasta is OK).    - Multi-grain bread is OK - like multi-grain flat bread or  sandwich thins.   - Avoid alcohol in excess.   - Exercise is also important. <><><><><><><><><><><><><><><><><><><><><><><><><><><><><><><><><> <><><><><><><><><><><><><><><><><><><><><><><><><><><><><><><><><>  - PSA is very Low  - Great   !  <><><><><><><><><><><><><><><><><><><><><><><><><><><><><><><><><> <><><><><><><><><><><><><><><><><><><><><><><><><><><><><><><><><>  -  A1c is Low  - No Diabetes  - Great  !  <><><><><><><><><><><><><><><><><><><><><><><><><><><><><><><><><>  -  Vitamin D = 35 - is Very Low  ( Bad )   - Vitamin D goal is between 70-100.   - Please INCREASE your Vitamin D to 10,000 units /day  - It is very important as a natural anti-inflammatory and helping the  immune system protect against viral infections, like the Covid-19    helping hair, skin, and nails, as well as reducing stroke and  heart attack risk.    - It helps your bones and helps with mood.  - It also decreases numerous cancer risks so please  take it as directed.   - Low Vit D is associated with a 200-300% higher risk for  CANCER   and 200-300% higher risk for HEART   ATTACK  &  STROKE.    - It is also associated with higher death rate at younger ages,   autoimmune diseases like Rheumatoid arthritis, Lupus,  Multiple Sclerosis.     - Also many other serious conditions, like depression, Alzheimer's  Dementia, infertility, muscle aches, fatigue, fibromyalgia   <><><><><><><><><><><><><><><><><><><><><><><><><><><><><><><><><> <><><><><><><><><><><><><><><><><><><><><><><><><><><><><><><><><>

## 2021-11-06 ENCOUNTER — Other Ambulatory Visit: Payer: Self-pay | Admitting: Internal Medicine

## 2022-02-23 IMAGING — US US ABDOMEN COMPLETE
1 series · 14 of 25 positions shown · non-contrast
Comparison: None.

CLINICAL DATA: Upper abdominal pain

EXAM:
ABDOMEN ULTRASOUND COMPLETE

[Series 1: us abdomen complete · 0.15mm/px · 14 of 80 slices shown]
[im 1/80]
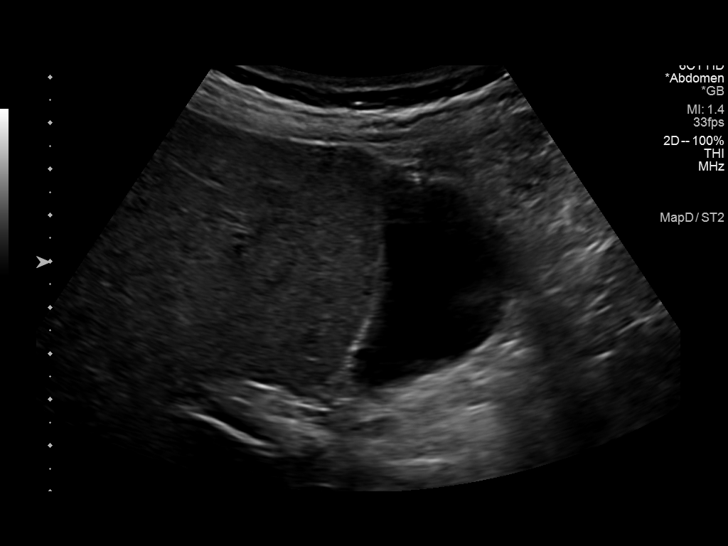
[im 7/80]
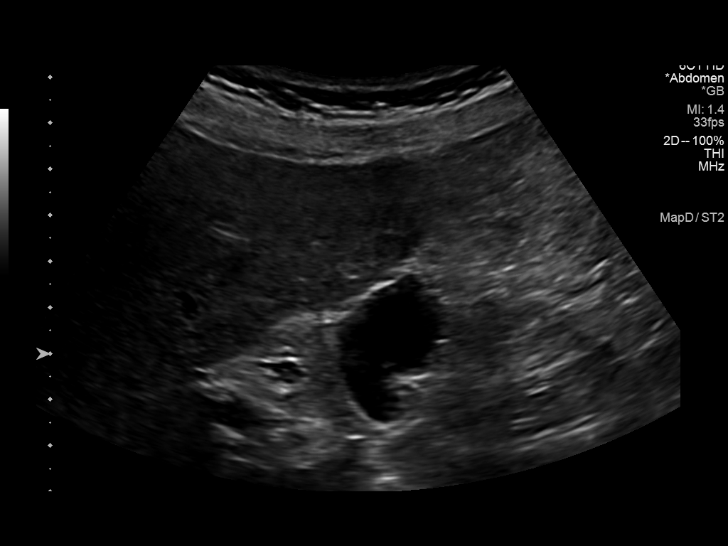
[im 14/80]
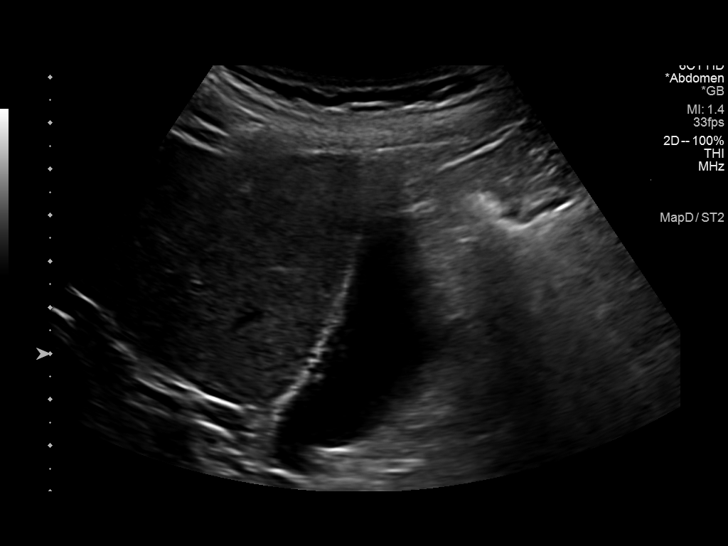
[im 20/80]
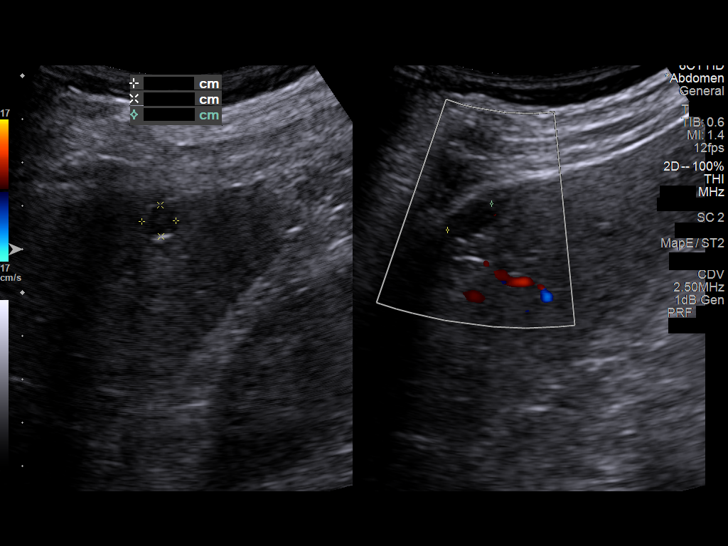
[im 27/80]
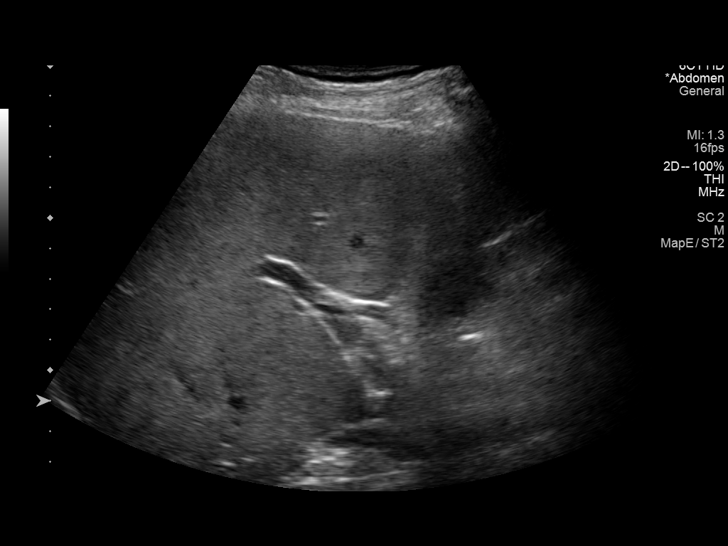
[im 30/80]
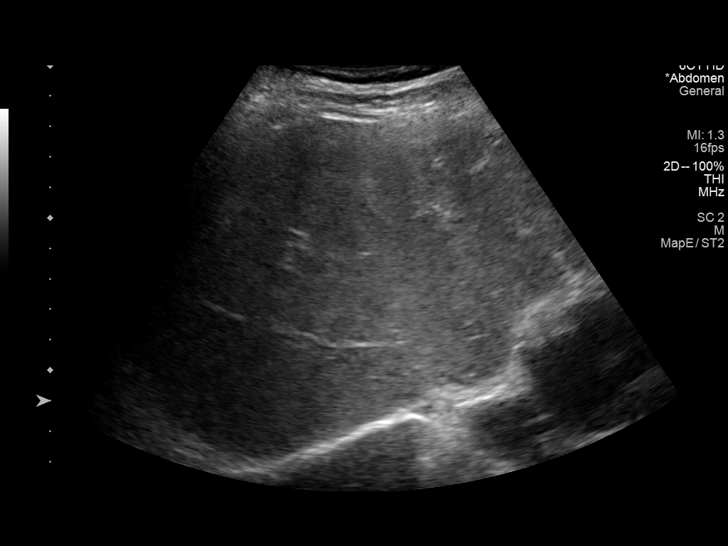
[im 37/80]
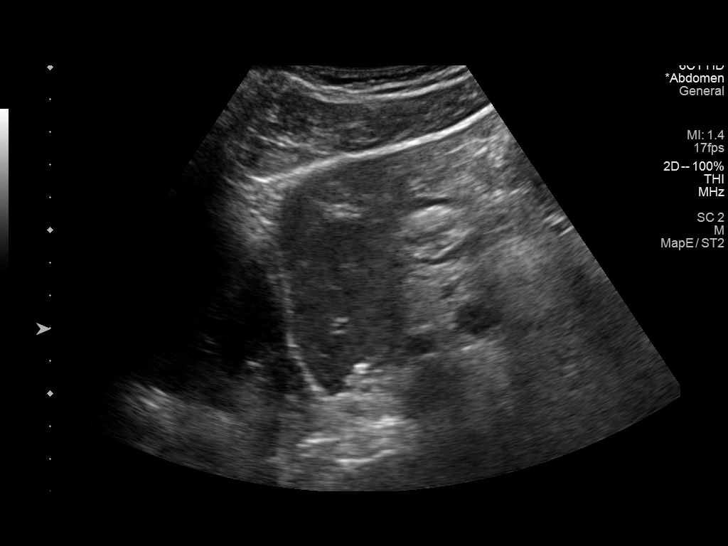
[im 43/80]
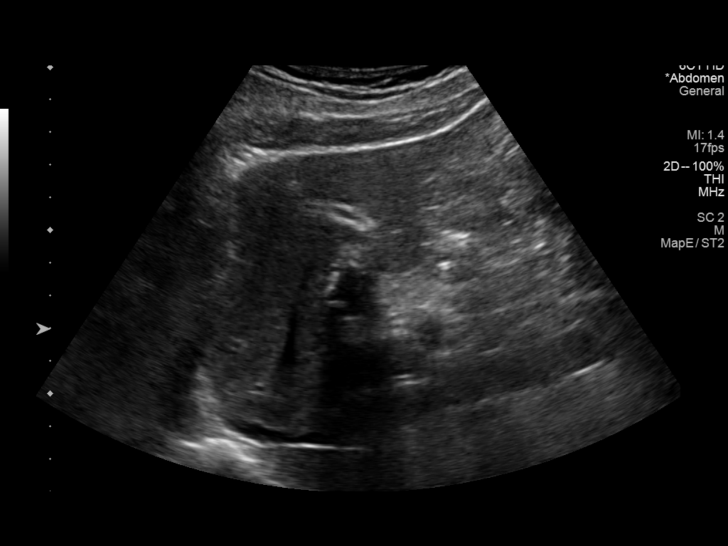
[im 50/80]
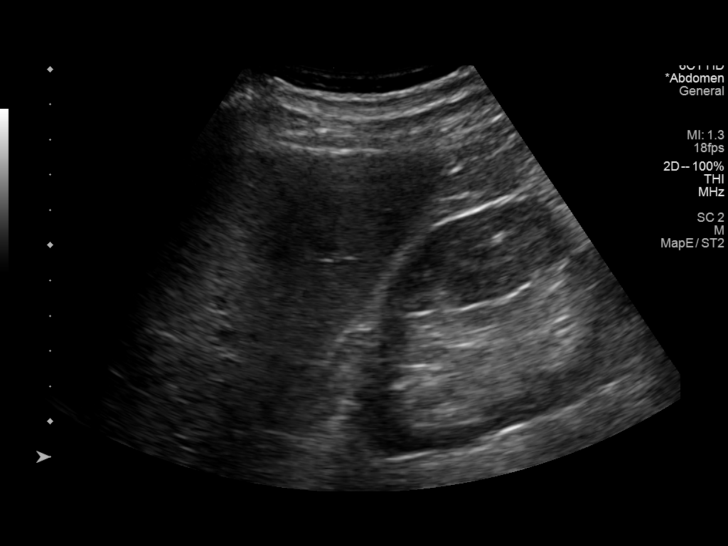
[im 53/80]
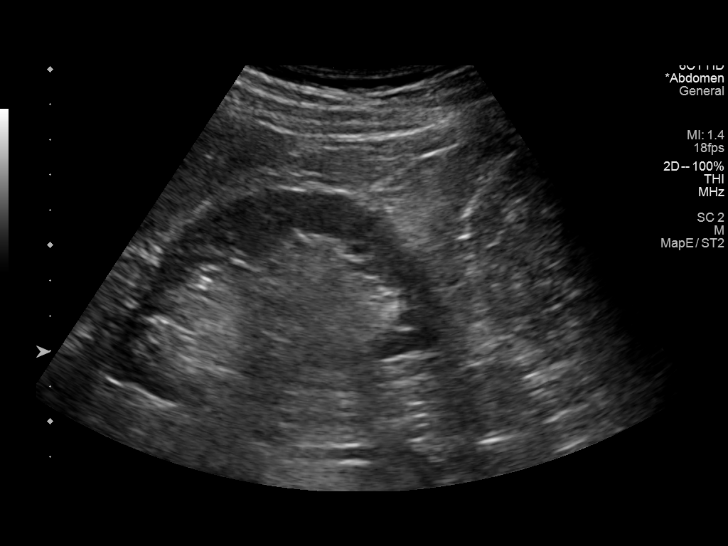
[im 60/80]
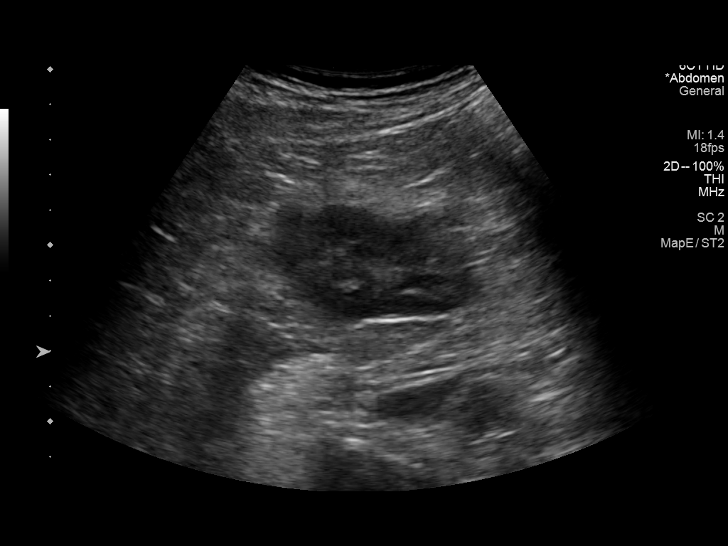
[im 66/80]
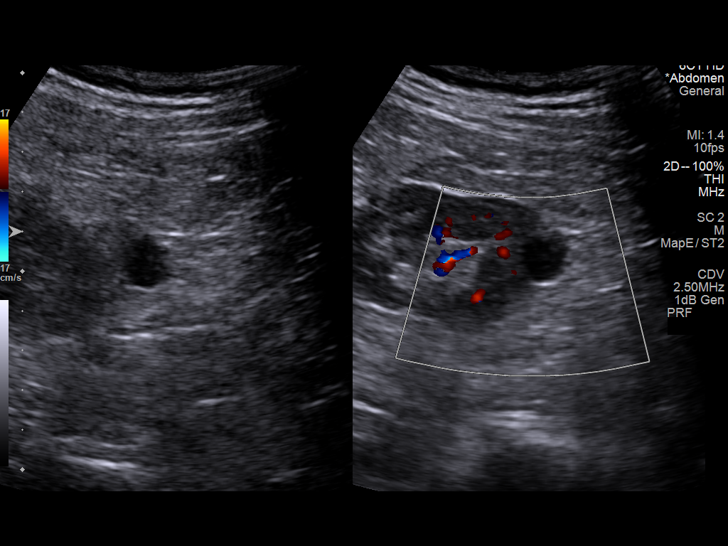
[im 73/80]
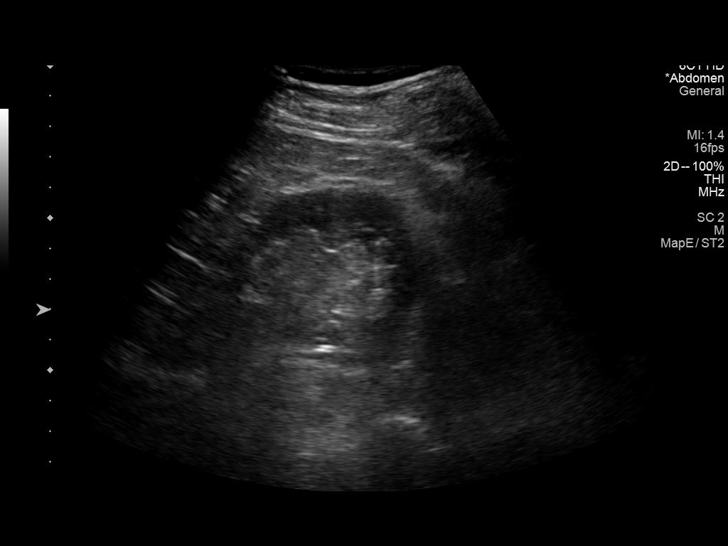
[im 80/80]
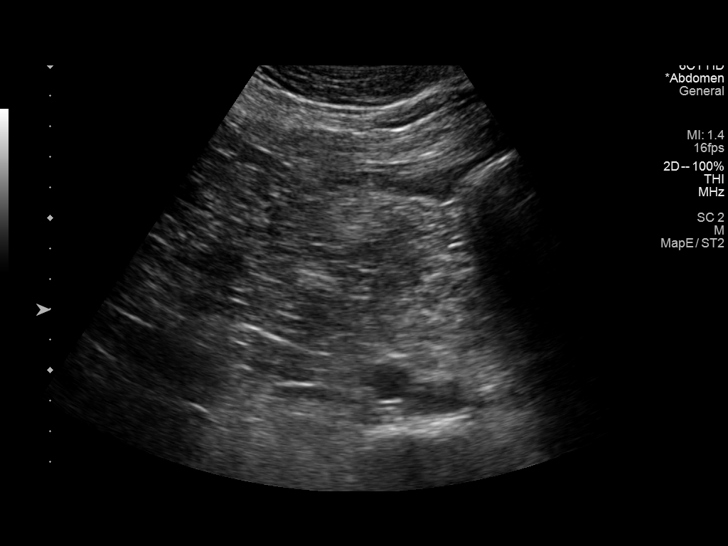

[14 of 25 positions shown; findings below may reference images not displayed]

FINDINGS: Gallbladder: No gallstones or wall thickening visualized. No
sonographic Murphy sign noted by sonographer.

Common bile duct: Diameter: 4 mm in proximal diameter

Liver: Mild coarsening of the hepatic echotexture is likely
technical in etiology. Headache parenchymal echogenicity is within
normal limits. No focal intrahepatic mass identified. 12 mm simple
cyst noted within the right hepatic lobe. No intrahepatic biliary
ductal dilation. Portal vein is patent on color Doppler imaging with
normal direction of blood flow towards the liver.

IVC: No abnormality visualized.

Pancreas: Visualized portion unremarkable.

Spleen: Size and appearance within normal limits.

Right Kidney: Length: 10.1 cm. Echogenicity within normal limits. No
mass or hydronephrosis visualized.

Left Kidney: Length: 10.9 cm. Echogenicity within normal limits. No
mass or hydronephrosis visualized. 13 mm simple cyst arises from the
lower pole of the left kidney.

Abdominal aorta: No aneurysm visualized.

Other findings: None.
IMPRESSION: Essentially unremarkable abdominal sonogram

## 2022-03-05 ENCOUNTER — Other Ambulatory Visit: Payer: Self-pay | Admitting: Nurse Practitioner

## 2022-03-05 DIAGNOSIS — F419 Anxiety disorder, unspecified: Secondary | ICD-10-CM

## 2022-03-08 ENCOUNTER — Other Ambulatory Visit: Payer: Self-pay | Admitting: Nurse Practitioner

## 2022-03-08 DIAGNOSIS — G8929 Other chronic pain: Secondary | ICD-10-CM

## 2022-03-08 MED ORDER — MELOXICAM 7.5 MG PO TABS: ORAL_TABLET | ORAL | 1 refills | Status: DC

## 2022-03-12 ENCOUNTER — Ambulatory Visit: Payer: Medicare PPO | Admitting: Nurse Practitioner

## 2022-03-12 ENCOUNTER — Encounter: Payer: Self-pay | Admitting: Nurse Practitioner

## 2022-03-12 VITALS — BP 132/78 | HR 86 | Temp 98.6°F | Ht 67.0 in | Wt 167.0 lb

## 2022-03-12 DIAGNOSIS — M5442 Lumbago with sciatica, left side: Secondary | ICD-10-CM | POA: Diagnosis not present

## 2022-03-12 DIAGNOSIS — M2578 Osteophyte, vertebrae: Secondary | ICD-10-CM

## 2022-03-12 DIAGNOSIS — Z79899 Other long term (current) drug therapy: Secondary | ICD-10-CM | POA: Diagnosis not present

## 2022-03-12 DIAGNOSIS — G8929 Other chronic pain: Secondary | ICD-10-CM

## 2022-03-12 DIAGNOSIS — E559 Vitamin D deficiency, unspecified: Secondary | ICD-10-CM

## 2022-03-12 DIAGNOSIS — R0989 Other specified symptoms and signs involving the circulatory and respiratory systems: Secondary | ICD-10-CM | POA: Diagnosis not present

## 2022-03-12 NOTE — Progress Notes (Signed)
Assessment and Plan:  Dillon [name] was seen today for acute visit.  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 Ellene Route Complete updated lumbar x-ray  - DG Lumbar Spine Complete; Future  Labile hypertension Controlled by lifestyle. Discussed DASH (Dietary Approaches to Stop Hypertension) DASH diet is lower in sodium than a typical American diet. Cut back on foods that are high in saturated fat, cholesterol, and trans fats. Eat more whole-grain foods, fish, poultry, and nuts Remain active and exercise as tolerated daily.  Monitor BP at home-Call if greater than 130/80.  Check CMP/CBC  Vitamin D deficiency Below goal last check 08/2021 Continue supplement for goal of 60-100  Medication management All medications discussed and reviewed in full. All questions and concerns regarding medications addressed.    Orders Placed This Encounter  Procedures   DG Lumbar Spine Complete    Standing Status:   Future    Standing Expiration Date:   03/13/2023    Order Specific Question:   Reason for Exam (SYMPTOM  OR DIAGNOSIS REQUIRED)    Answer:   Chronic low bcak pain, centralized ariund L2-3, radiates to left posteior thigh and leg - no recent injury    Order Specific Question:   Preferred imaging location?    Answer:   [id number]    Notify office for further evaluation and treatment, questions or concerns if any reported s/s fail to improve.   The patient was advised to call back or seek an in-person evaluation if any symptoms worsen or if the condition fails to improve as anticipated.   Further disposition pending results of labs. Discussed med's effects and SE's.    I discussed the assessment and treatment plan with the patient. The patient was provided an opportunity to ask questions and all were answered. The patient  agreed with the plan and demonstrated an understanding of the instructions.  Discussed med's effects and SE's. Screening labs and tests as requested with regular follow-up as recommended.  I provided 20 minutes of face-to-face time during this encounter including counseling, chart review, and critical decision making was preformed.   Future Appointments  Date Time Provider [place]  08/24/2022  2:00 PM [name], MD [facility] None    ------------------------------------------------------------------------------------------------------------------   HPI BP 132/78   Pulse 86   Temp 98.6 F (37 C)   Ht 5' 7"$  (1.702 m)   Wt 167 lb (75.8 kg)   SpO2 98%   BMI 26.16 kg/m   67 y.o.male presents for evaluation of low back pain, present intermittently over the last several years.  Retired 6 months ago, and restarted a new job that has triggered the back pain.  Pain is now more persistent, unable to throw a ball with his grandchildren.  Pain worse when sitting, walking, walking up stairs.  No pain when bending.  Denies any recent injury fall or trauma.  Takes Meloxicam 7.5 mg PRN with some effectiveness.  Upon review had a lumbar x-ray 2013 normal but did show some osteophytes.    Recent vitamin d check revealed levels of 35.  BP has been well controlled without medication.  BP Readings from Last 3 Encounters:  03/12/22 132/78  08/20/21 139/82  03/30/21 130/68     Past Medical History:  Diagnosis Date   DJD (degenerative joint disease)    Elevated hemoglobin A1c    Hyperlipidemia  Hypertension      Allergies  Allergen Reactions   Codeine Nausea And Vomiting   Norco [Hydrocodone-Acetaminophen]    Penicillins Nausea And Vomiting    Current Outpatient Medications on File Prior to Visit  Medication Sig   acetaminophen (TYLENOL) 500 MG tablet Take 500 mg by mouth every 6 (six) hours as needed.   ALPRAZolam (XANAX) 0.25 MG tablet TAKE 1/2 TO 1 TABLET BY MOUTH  2 TO 3 TIMES PER DAY AS NEEDED FOR ANXIETY   B Complex-C (B-COMPLEX WITH VITAMIN C) tablet Take 1 tablet by mouth daily.   Cholecalciferol (VITAMIN D PO) Take 200 Units by mouth daily.    diphenoxylate-atropine (LOMOTIL) 2.5-0.025 MG tablet Take 1 to 2 tablets up to 4 x /day for Diarrhea (max 8 tablets /day)   meloxicam (MOBIC) 7.5 MG tablet TAKE 1 TABLET(7.5 MG) AS NEEDED FOR BACK PAIN   Multiple Vitamin (MULTIVITAMIN) tablet Take 1 tablet by mouth daily.   rosuvastatin (CRESTOR) 5 MG tablet Take  1 tablet  3 x /week (Mon Wed Fri)  for Cholesterol                                                          /                                         TAKE                                     BY                                      MOUTH   busPIRone (BUSPAR) 10 MG tablet Take  1/2 to 1 tablet   3 x /day  for Anxiety (Patient not taking: Reported on 03/12/2022)   citalopram (CELEXA) 10 MG tablet Take 1 tablet (10 mg total) by mouth daily. (Patient not taking: Reported on 08/20/2021)   No current facility-administered medications on file prior to visit.    ROS: all negative except above.   Physical Exam:  BP 132/78   Pulse 86   Temp 98.6 F (37 C)   Ht 5' 7"$  (1.702 m)   Wt 167 lb (75.8 kg)   SpO2 98%   BMI 26.16 kg/m   General Appearance: Well nourished, in no apparent distress. Eyes: PERRLA, EOMs, conjunctiva no swelling or erythema Sinuses: No Frontal/maxillary tenderness ENT/Mouth: Ext aud canals clear, TMs without erythema, bulging. No erythema, swelling, or exudate on post pharynx.  Tonsils not swollen or erythematous. Hearing normal.  Neck: Supple, thyroid normal.  Respiratory: Respiratory effort normal, BS equal bilaterally without rales, rhonchi, wheezing or stridor.  Cardio: RRR with no MRGs. Brisk peripheral pulses without edema.  Abdomen: Soft, + BS.  Non tender, no guarding, rebound, masses. Lymphatics: Non tender without lymphadenopathy.  Musculoskeletal:  Tender to palpation  along l5. Full ROM, 5/5 strength, normal gait.  Skin: Warm, dry without rashes, lesions, ecchymosis.  Neuro: Cranial nerves intact. Normal muscle tone, no cerebellar symptoms. Sensation intact.  Psych: Awake  and oriented X 3, normal affect, Insight and Judgment appropriate.     Darrol Jump, NP 12:34 PM [facility]

## 2022-03-12 NOTE — Patient Instructions (Signed)
Low Back Sprain or Strain Rehab Ask your health care provider which exercises are safe for you. Do exercises exactly as told by your health care provider and adjust them as directed. It is normal to feel mild stretching, pulling, tightness, or discomfort as you do these exercises. Stop right away if you feel sudden pain or your pain gets worse. Do not begin these exercises until told by your health care provider. Stretching and range-of-motion exercises These exercises warm up your muscles and joints and improve the movement and flexibility of your back. These exercises also help to relieve pain, numbness, and tingling. Lumbar rotation  Lie on your back on a firm bed or the floor with your knees bent. Straighten your arms out to your sides so each arm forms a 90-degree angle (right angle) with a side of your body. Slowly move (rotate) both of your knees to one side of your body until you feel a stretch in your lower back (lumbar). Try not to let your shoulders lift off the floor. Hold this position for __________ seconds. Tense your abdominal muscles and slowly move your knees back to the starting position. Repeat this exercise on the other side of your body. Repeat __________ times. Complete this exercise __________ times a day. Single knee to chest  Lie on your back on a firm bed or the floor with both legs straight. Bend one of your knees. Use your hands to move your knee up toward your chest until you feel a gentle stretch in your lower back and buttock. Hold your leg in this position by holding on to the front of your knee. Keep your other leg as straight as possible. Hold this position for __________ seconds. Slowly return to the starting position. Repeat with your other leg. Repeat __________ times. Complete this exercise __________ times a day. Prone extension on elbows  Lie on your abdomen on a firm bed or the floor (prone position). Prop yourself up on your elbows. Use your arms  to help lift your chest up until you feel a gentle stretch in your abdomen and your lower back. This will place some of your body weight on your elbows. If this is uncomfortable, try stacking pillows under your chest. Your hips should stay down, against the surface that you are lying on. Keep your hip and back muscles relaxed. Hold this position for __________ seconds. Slowly relax your upper body and return to the starting position. Repeat __________ times. Complete this exercise __________ times a day. Strengthening exercises These exercises build strength and endurance in your back. Endurance is the ability to use your muscles for a long time, even after they get tired. Pelvic tilt This exercise strengthens the muscles that lie deep in the abdomen. Lie on your back on a firm bed or the floor with your legs extended. Bend your knees so they are pointing toward the ceiling and your feet are flat on the floor. Tighten your lower abdominal muscles to press your lower back against the floor. This motion will tilt your pelvis so your tailbone points up toward the ceiling instead of pointing to your feet or the floor. To help with this exercise, you may place a small towel under your lower back and try to push your back into the towel. Hold this position for __________ seconds. Let your muscles relax completely before you repeat this exercise. Repeat __________ times. Complete this exercise __________ times a day. Alternating arm and leg raises  Get on your hands   and knees on a firm surface. If you are on a hard floor, you may want to use padding, such as an exercise mat, to cushion your knees. Line up your arms and legs. Your hands should be directly below your shoulders, and your knees should be directly below your hips. Lift your left leg behind you. At the same time, raise your right arm and straighten it in front of you. Do not lift your leg higher than your hip. Do not lift your arm higher  than your shoulder. Keep your abdominal and back muscles tight. Keep your hips facing the ground. Do not arch your back. Keep your balance carefully, and do not hold your breath. Hold this position for __________ seconds. Slowly return to the starting position. Repeat with your right leg and your left arm. Repeat __________ times. Complete this exercise __________ times a day. Abdominal set with straight leg raise  Lie on your back on a firm bed or the floor. Bend one of your knees and keep your other leg straight. Tense your abdominal muscles and lift your straight leg up, 4-6 inches (10-15 cm) off the ground. Keep your abdominal muscles tight and hold this position for __________ seconds. Do not hold your breath. Do not arch your back. Keep it flat against the ground. Keep your abdominal muscles tense as you slowly lower your leg back to the starting position. Repeat with your other leg. Repeat __________ times. Complete this exercise __________ times a day. Single leg lower with bent knees Lie on your back on a firm bed or the floor. Tense your abdominal muscles and lift your feet off the floor, one foot at a time, so your knees and hips are bent in 90-degree angles (right angles). Your knees should be over your hips and your lower legs should be parallel to the floor. Keeping your abdominal muscles tense and your knee bent, slowly lower one of your legs so your toe touches the ground. Lift your leg back up to return to the starting position. Do not hold your breath. Do not let your back arch. Keep your back flat against the ground. Repeat with your other leg. Repeat __________ times. Complete this exercise __________ times a day. Posture and body mechanics Good posture and healthy body mechanics can help to relieve stress in your body's tissues and joints. Body mechanics refers to the movements and positions of your body while you do your daily activities. Posture is part of body  mechanics. Good posture means: Your spine is in its natural S-curve position (neutral). Your shoulders are pulled back slightly. Your head is not tipped forward (neutral). Follow these guidelines to improve your posture and body mechanics in your everyday activities. Standing  When standing, keep your spine neutral and your feet about hip-width apart. Keep a slight bend in your knees. Your ears, shoulders, and hips should line up. When you do a task in which you stand in one place for a long time, place one foot up on a stable object that is 2-4 inches (5-10 cm) high, such as a footstool. This helps keep your spine neutral. Sitting  When sitting, keep your spine neutral and keep your feet flat on the floor. Use a footrest, if necessary, and keep your thighs parallel to the floor. Avoid rounding your shoulders, and avoid tilting your head forward. When working at a desk or a computer, keep your desk at a height where your hands are slightly lower than your elbows. Slide your   chair under your desk so you are close enough to maintain good posture. When working at a computer, place your monitor at a height where you are looking straight ahead and you do not have to tilt your head forward or downward to look at the screen. Resting When lying down and resting, avoid positions that are most painful for you. If you have pain with activities such as sitting, bending, stooping, or squatting, lie in a position in which your body does not bend very much. For example, avoid curling up on your side with your arms and knees near your chest (fetal position). If you have pain with activities such as standing for a long time or reaching with your arms, lie with your spine in a neutral position and bend your knees slightly. Try the following positions: Lying on your side with a pillow between your knees. Lying on your back with a pillow under your knees. Lifting  When lifting objects, keep your feet at least  shoulder-width apart and tighten your abdominal muscles. Bend your knees and hips and keep your spine neutral. It is important to lift using the strength of your legs, not your back. Do not lock your knees straight out. Always ask for help to lift heavy or awkward objects. This information is not intended to replace advice given to you by your health care provider. Make sure you discuss any questions you have with your health care provider. Document Revised: 04/07/2020 Document Reviewed: 04/07/2020 Elsevier Patient Education  [place].

## 2022-03-15 ENCOUNTER — Ambulatory Visit
Admission: RE | Admit: 2022-03-15 | Discharge: 2022-03-15 | Disposition: A | Discharge status: Home / Self Care | Payer: Medicare PPO | Source: Ambulatory Visit | Attending: Nurse Practitioner | Admitting: Nurse Practitioner

## 2022-03-15 DIAGNOSIS — M545 Low back pain, unspecified: Secondary | ICD-10-CM | POA: Diagnosis not present

## 2022-03-15 DIAGNOSIS — G8929 Other chronic pain: Secondary | ICD-10-CM

## 2022-03-22 ENCOUNTER — Telehealth: Payer: Self-pay | Admitting: Nurse Practitioner

## 2022-03-22 DIAGNOSIS — R197 Diarrhea, unspecified: Secondary | ICD-10-CM

## 2022-03-22 MED ORDER — DIPHENOXYLATE-ATROPINE 2.5-0.025 MG PO TABS: ORAL_TABLET | ORAL | 0 refills | Status: DC

## 2022-03-22 NOTE — Telephone Encounter (Signed)
Patient is requesting a refill on Lomotil to [facility] on [address].

## 2022-03-22 NOTE — Addendum Note (Signed)
Addended by: Chancy Hurter on: 03/22/2022 10:55 AM   Modules accepted: Orders

## 2022-03-26 ENCOUNTER — Other Ambulatory Visit: Payer: Self-pay

## 2022-03-26 DIAGNOSIS — R197 Diarrhea, unspecified: Secondary | ICD-10-CM

## 2022-03-26 MED ORDER — DIPHENOXYLATE-ATROPINE 2.5-0.025 MG PO TABS: ORAL_TABLET | ORAL | 0 refills | Status: DC

## 2022-08-24 ENCOUNTER — Ambulatory Visit (INDEPENDENT_AMBULATORY_CARE_PROVIDER_SITE_OTHER): Payer: Medicare PPO | Admitting: Internal Medicine

## 2022-08-24 ENCOUNTER — Encounter: Payer: Self-pay | Admitting: Internal Medicine

## 2022-08-24 VITALS — BP 136/66 | HR 100 | Temp 97.7°F | Resp 16 | Ht 66.0 in | Wt 168.6 lb

## 2022-08-24 DIAGNOSIS — Z79899 Other long term (current) drug therapy: Secondary | ICD-10-CM | POA: Diagnosis not present

## 2022-08-24 DIAGNOSIS — N401 Enlarged prostate with lower urinary tract symptoms: Secondary | ICD-10-CM | POA: Diagnosis not present

## 2022-08-24 DIAGNOSIS — Z136 Encounter for screening for cardiovascular disorders: Secondary | ICD-10-CM | POA: Diagnosis not present

## 2022-08-24 DIAGNOSIS — Z1211 Encounter for screening for malignant neoplasm of colon: Secondary | ICD-10-CM

## 2022-08-24 DIAGNOSIS — E782 Mixed hyperlipidemia: Secondary | ICD-10-CM

## 2022-08-24 DIAGNOSIS — K5190 Ulcerative colitis, unspecified, without complications: Secondary | ICD-10-CM

## 2022-08-24 DIAGNOSIS — Z0000 Encounter for general adult medical examination without abnormal findings: Secondary | ICD-10-CM | POA: Diagnosis not present

## 2022-08-24 DIAGNOSIS — R0989 Other specified symptoms and signs involving the circulatory and respiratory systems: Secondary | ICD-10-CM

## 2022-08-24 DIAGNOSIS — I700 Atherosclerosis of aorta: Secondary | ICD-10-CM

## 2022-08-24 DIAGNOSIS — E559 Vitamin D deficiency, unspecified: Secondary | ICD-10-CM

## 2022-08-24 DIAGNOSIS — R7309 Other abnormal glucose: Secondary | ICD-10-CM | POA: Diagnosis not present

## 2022-08-24 DIAGNOSIS — Z0001 Encounter for general adult medical examination with abnormal findings: Secondary | ICD-10-CM

## 2022-08-24 DIAGNOSIS — N138 Other obstructive and reflux uropathy: Secondary | ICD-10-CM | POA: Diagnosis not present

## 2022-08-24 DIAGNOSIS — Z8249 Family history of ischemic heart disease and other diseases of the circulatory system: Secondary | ICD-10-CM

## 2022-08-24 DIAGNOSIS — Z125 Encounter for screening for malignant neoplasm of prostate: Secondary | ICD-10-CM | POA: Diagnosis not present

## 2022-08-24 LAB — CBC WITH DIFFERENTIAL/PLATELET
Basophils Absolute: 52 cells/uL (ref 0–200)
Basophils Relative: 0.7 %
Eosinophils Relative: 1.8 %
Hemoglobin: 14.4 g/dL (ref 13.2–17.1)
MCV: 90.6 fL (ref 80.0–100.0)
MPV: 10.6 fL (ref 7.5–12.5)
Monocytes Relative: 7.6 %
Neutro Abs: 5180 cells/uL (ref 1500–7800)
Platelets: 280 10*3/uL (ref 140–400)
Total Lymphocyte: 19.9 %
WBC: 7.4 10*3/uL (ref 3.8–10.8)

## 2022-08-24 MED ORDER — ESCITALOPRAM OXALATE 5 MG PO TABS: ORAL_TABLET | ORAL | 3 refills | Status: DC

## 2022-08-24 MED ORDER — CEPHALEXIN 500 MG PO CAPS: ORAL_CAPSULE | ORAL | 2 refills | Status: DC

## 2022-08-24 MED ORDER — IPRATROPIUM BROMIDE 0.06 % NA SOLN: NASAL | 3 refills | Status: AC

## 2022-08-24 NOTE — Patient Instructions (Signed)

## 2022-08-24 NOTE — Progress Notes (Signed)
Future Appointments  Date Time Provider Department  08/24/2022  2:00 PM Lucky Cowboy, MD [facility]  09/05/2023  2:00 PM Lucky Cowboy, MD [facility]    Annual  Screening/Preventative Visit  & Comprehensive Evaluation & Examination                                             This very nice 67 y.o. MWM with  labile HTN, HLD, Prediabetes and Vitamin D Deficiency presents for a Screening /Preventative Visit & comprehensive evaluation and management of multiple medical co-morbidities.                                        Patient has hx/o Ulcerative Colitis  s/p  Total Colectomy in 2000 and has 8-10 loose BM's daily. Patient had initial Total colectomy w/colostomy & subsequent reversal of colostomy)       HTN predates circa  2013.  Patient's BP has been controlled at home.  Today's BP is  at goal -  136/66 . Patient denies any cardiac symptoms as chest pain, palpitations, shortness of breath, dizziness or ankle swelling.      Patient's hyperlipidemia is controlled with diet and Rosuvastatin Patient denies myalgias or other medication SE's. Last lipids were at goal except elevated Trig's:  Lab Results  Component Value Date   CHOL 202 (H) 08/20/2021   HDL 72 08/20/2021   LDLCALC 86 08/20/2021   TRIG 333 (H) 08/20/2021   CHOLHDL 2.8 08/20/2021        Patient has hx/o prediabetes  (A1c 5.7% /2013 and 5.8% /2016) and patient denies reactive hypoglycemic symptoms, visual blurring, diabetic polys or paresthesias. Last A1c was normal & at goal:  Lab Results  Component Value Date   HGBA1C 5.5 08/20/2021        Finally, patient has history of Vitamin D Deficiency ("37" /2016) and last vitamin D was still low.  Lab Results  Component Value Date   VD25OH 35 08/20/2021       Current Outpatient Medications  Medication Instructions   acetaminophen (TYLENOL) 500 mg, Oral, Every 6 hours PRN   ALPRAZolam (XANAX) 0.25 MG tablet TAKE 1/2 TO 1 TABLET BY MOUTH 2 TO 3 TIMES  PER DAY AS NEEDED FOR ANXIETY   B-COMPLEX W/ VITAMIN C  1 tablet, Daily   busPIRone (BUSPAR) 10 MG tablet Take  1/2 to 1 tablet   3 x /day  for Anxiety   Cholecalciferol (VITAMIN D PO) 200 Units, Oral, Daily   citalopram (CELEXA) 10 mg, Oral, Daily   diphenoxylate-atropine (LOMOTIL) 2.5-0.025 MG tablet Take 1 to 2 tablets up to 4 x /day for Diarrhea (max 8 tablets /day)   meloxicam (MOBIC) 7.5 MG tablet TAKE 1 TABLET(7.5 MG) AS NEEDED FOR BACK PAIN   Multiple Vitamin (MULTIVITAMIN) tablet 1 tablet, Oral, Daily   rosuvastatin (CRESTOR) 5 MG tablet Take  1 tablet  3 x /week (Mon Wed Fri)      Allergies  Allergen Reactions   Codeine Nausea And Vomiting   Norco [Hydrocodone-Acetaminophen]    Penicillins Nausea And Vomiting    Past Medical History:  Diagnosis Date   DJD (degenerative joint disease)    Elevated hemoglobin A1c    Hyperlipidemia    Hypertension  Health Maintenance  Topic Date Due   COLONOSCOPY Never done   INFLUENZA VACCINE  09/02/2019   COVID-19 Vaccine (4 - Booster for Pfizer series) 06/23/2020   TETANUS/TDAP  02/07/2023   Hepatitis C Screening  Completed   HIV Screening  Completed   HPV VACCINES  Aged Out   Last Colon - 2000  Past Surgical History:  Procedure Laterality Date   COLON SURGERY  2000, reversal 2001   coloectomy, with reversal and now a J pouch   ELBOW LIGAMENT RECONSTRUCTION Right July 2014   Dr Orlan Leavens   ELBOW LIGAMENT RECONSTRUCTION Left Sept 2016   Dr Orlan Leavens   ELBOW SURGERY Right 2014   HAND SURGERY Right 1975   HAND SURGERY Right 1975    Family History  Problem Relation Age of Onset   Diabetes Mother    Arthritis Mother        Rheumatoid   Heart disease Father    Gout Father    Cancer Father        melanoma   Cancer Sister        Breast    Social History   Socioeconomic History   Marital status: Married    Spouse name: Randa Evens   Number of children: 1 son died of a Drug OD & a 2sd son has been followed at drug Rehab   & 1 daughter  Occupational History    Counsellor for [employer]    Tobacco Use   Smoking status: Never Smoker   Smokeless tobacco: Never Used  Substance and Sexual Activity   Alcohol use: Yes    Comment: occa smixed drink   Drug use: No   Sexual activity: Yes    ROS Constitutional: Denies fever, chills, weight loss/gain, headaches, insomnia,  night sweats or change in appetite. Does c/o fatigue. Eyes: Denies redness, blurred vision, diplopia, discharge, itchy or watery eyes.  ENT: Denies discharge, congestion, post nasal drip, epistaxis, sore throat, earache, hearing loss, dental pain, Tinnitus, Vertigo, Sinus pain or snoring.  Cardio: Denies chest pain, palpitations, irregular heartbeat, syncope, dyspnea, diaphoresis, orthopnea, PND, claudication or edema Respiratory: denies cough, dyspnea, DOE, pleurisy, hoarseness, laryngitis or wheezing.  Gastrointestinal: Denies dysphagia, heartburn, reflux, water brash, pain, cramps, nausea, vomiting, bloating, diarrhea, constipation, hematemesis, melena, hematochezia, jaundice or hemorrhoids Genitourinary: Denies dysuria, frequency,  discharge, hematuria or flank pain. Has urgency, nocturia x 2-3 & occasional hesitancy. Musculoskeletal: Denies arthralgia, myalgia, stiffness, Jt. Swelling, pain, limp or strain/sprain. Denies Falls. Skin: Denies puritis, rash, hives, warts, acne, eczema or change in skin lesion Neuro: No weakness, tremor, incoordination, spasms, paresthesia or pain Psychiatric: Denies confusion, memory loss or sensory loss. Denies Depression. Endocrine: Denies change in weight, skin, hair change, nocturia, and paresthesia, diabetic polys, visual blurring or hyper / hypo glycemic episodes.  Heme/Lymph: No excessive bleeding, bruising or enlarged lymph nodes.  Physical Exam  BP 136/66   Pulse 100   Temp 97.7 F (36.5 C)   Resp 16   Ht 5\' 6"  (1.676 m)   Wt 168 lb 9.6 oz (76.5 kg)   SpO2 97%   BMI 27.21 kg/m    General Appearance: Well nourished and well groomed and in no apparent distress.  Eyes: PERRLA, EOMs, conjunctiva no swelling or erythema, normal fundi and vessels. Sinuses: No frontal/maxillary tenderness ENT/Mouth: EACs patent / TMs  nl. Nares clear without erythema, swelling, mucoid exudates. Oral hygiene is good. No erythema, swelling, or exudate. Tongue normal, non-obstructing. Tonsils not swollen or erythematous. Hearing normal.  Neck: Supple, thyroid not palpable. No bruits, nodes or JVD. Respiratory: Respiratory effort normal.  BS equal and clear bilateral without rales, rhonci, wheezing or stridor. Cardio: Heart sounds are normal with regular rate and rhythm and no murmurs, rubs or gallops. Peripheral pulses are normal and equal bilaterally without edema. No aortic or femoral bruits. Chest: symmetric with normal excursions and percussion.  Abdomen: Soft, with Nl bowel sounds.  Slight Diastasis of mid midline abdomen. Non tender, no guarding, rebound, hernias, masses, or organomegaly.  Lymphatics: Non tender without lymphadenopathy.  Musculoskeletal: Full ROM all peripheral extremities, joint stability, 5/5 strength, and normal gait. Skin: Warm and dry without rashes, lesions, cyanosis, clubbing or  ecchymosis.  Neuro: Cranial nerves intact, reflexes equal bilaterally. Normal muscle tone, no cerebellar symptoms. Sensation intact.  Pysch: Alert and oriented x 3 with normal affect, insight and judgment appropriate.   Assessment and Plan  1. Annual Preventative/Screening Exam    2. Labile hypertension  - EKG 12-Lead - [place], RETROPERITNL ABD,  LTD - CBC with Differential/Platelet - COMPLETE METABOLIC PANEL WITH GFR - Magnesium - TSH  3. Hyperlipidemia, mixed  - EKG 12-Lead - [place], RETROPERITNL ABD,  LTD - Lipid panel - TSH  4. Abnormal glucose  - EKG 12-Lead - Hemoglobin A1c - Insulin, random  5. Vitamin D deficiency  - VITAMIN D 25 Hydroxy (  6. Ulcerative colitis  (HCC)  - CBC with Differential/Platelet - COMPLETE METABOLIC PANEL WITH GFR  7. BPH with obstruction/lower urinary tract symptoms  - PSA  8. Prostate cancer screening  - PSA  9. Screening for colorectal cancer  - POC Hemoccult Bld/Stl   10. Screening for heart disease  - EKG 12-Lead  11. FHx: heart disease  - EKG 12-Lead - [place], RETROPERITNL ABD,  LTD  12. Screening for AAA (aortic abdominal aneurysm)  - [place], RETROPERITNL ABD,  LTD  13. Medication management  - Urinalysis, Routine w reflex microscopic - Microalbumin / creatinine urine ratio - CBC with Differential/Platelet - COMPLETE METABOLIC PANEL WITH GFR - Magnesium - Lipid panel - TSH - Hemoglobin A1c - Insulin, random - VITAMIN D 25 Hydroxy         Patient was counseled in prudent diet, weight control to achieve/maintain BMI less than 25, BP monitoring, regular exercise and medications as discussed.  Discussed med effects and SE's.  Recommended try Tumeric & Cinnamon for pain in hands/fingers.  Rx Cymbalta for his mood & chronic pain in hands. Routine screening labs and tests as requested with regular follow-up as recommended. Over 40 minutes of exam, counseling, chart review and high complex critical decision making was performed   Marinus Maw, MD

## 2022-08-25 LAB — CBC WITH DIFFERENTIAL/PLATELET
Absolute Monocytes: 562 cells/uL (ref 200–950)
Eosinophils Absolute: 133 cells/uL (ref 15–500)
HCT: 42.2 % (ref 38.5–50.0)
Lymphs Abs: 1473 cells/uL (ref 850–3900)
MCH: 30.9 pg (ref 27.0–33.0)
MCHC: 34.1 g/dL (ref 32.0–36.0)
Neutrophils Relative %: 70 %
RBC: 4.66 10*6/uL (ref 4.20–5.80)
RDW: 13.4 % (ref 11.0–15.0)

## 2022-08-25 LAB — COMPLETE METABOLIC PANEL WITH GFR
AG Ratio: 1.6 (calc) (ref 1.0–2.5)
ALT: 26 U/L (ref 9–46)
AST: 26 U/L (ref 10–35)
Albumin: 4.5 g/dL (ref 3.6–5.1)
Alkaline phosphatase (APISO): 64 U/L (ref 35–144)
BUN: 11 mg/dL (ref 7–25)
CO2: 28 mmol/L (ref 20–32)
Calcium: 9.4 mg/dL (ref 8.6–10.3)
Chloride: 103 mmol/L (ref 98–110)
Creat: 0.91 mg/dL (ref 0.70–1.35)
Globulin: 2.9 g/dL (calc) (ref 1.9–3.7)
Glucose, Bld: 90 mg/dL (ref 65–99)
Potassium: 3.9 mmol/L (ref 3.5–5.3)
Sodium: 140 mmol/L (ref 135–146)
Total Bilirubin: 0.6 mg/dL (ref 0.2–1.2)
Total Protein: 7.4 g/dL (ref 6.1–8.1)
eGFR: 93 mL/min/{1.73_m2} (ref >=60)

## 2022-08-25 LAB — URINALYSIS, ROUTINE W REFLEX MICROSCOPIC
Bilirubin Urine: NEGATIVE
Glucose, UA: NEGATIVE
Hgb urine dipstick: NEGATIVE
Ketones, ur: NEGATIVE
Leukocytes,Ua: NEGATIVE
Nitrite: NEGATIVE
Protein, ur: NEGATIVE
Specific Gravity, Urine: 1.016 (ref 1.001–1.035)
pH: 5.5 (ref 5.0–8.0)

## 2022-08-25 LAB — MICROALBUMIN / CREATININE URINE RATIO
Creatinine, Urine: 244 mg/dL (ref 20–320)
Microalb Creat Ratio: 9 mg/g creat (ref <30)
Microalb, Ur: 2.3 mg/dL

## 2022-08-25 LAB — PSA: PSA: 0.89 ng/mL (ref <=4.00)

## 2022-08-25 LAB — LIPID PANEL
Cholesterol: 146 mg/dL (ref <200)
HDL: 75 mg/dL (ref >=40)
LDL Cholesterol (Calc): 50 mg/dL (calc)
Non-HDL Cholesterol (Calc): 71 mg/dL (calc) (ref <130)
Total CHOL/HDL Ratio: 1.9 (calc) (ref <5.0)
Triglycerides: 125 mg/dL (ref <150)

## 2022-08-25 LAB — VITAMIN D 25 HYDROXY (VIT D DEFICIENCY, FRACTURES): Vit D, 25-Hydroxy: 43 ng/mL (ref 30–100)

## 2022-08-25 LAB — TSH: TSH: 0.98 mIU/L (ref 0.40–4.50)

## 2022-08-25 LAB — INSULIN, RANDOM: Insulin: 4.5 u[IU]/mL

## 2022-08-25 LAB — MAGNESIUM: Magnesium: 1.8 mg/dL (ref 1.5–2.5)

## 2022-08-28 ENCOUNTER — Other Ambulatory Visit: Payer: Self-pay | Admitting: Internal Medicine

## 2022-09-26 NOTE — Progress Notes (Unsigned)
Future Appointments  Date Time Provider Department  09/27/2022 10:30 AM Lucky Cowboy, MD [facility]  11/25/2022                 wellness 10:30 AM Adela Glimpse, NP [facility]  03/02/2023                   6 mo ov 10:30 AM Lucky Cowboy, MD [facility]  06/02/2023                    wellness 10:30 AM Adela Glimpse, NP [facility]  09/08/2023                     cpe 10:00 AM Lucky Cowboy, MD [facility]    History of Present Illness:      This very nice 67 y.o. MWM with  labile HTN, HLD, Prediabetes and Vitamin D Deficiency presents with c/o of a tender "raw" lump about hid rectum that occasionally bleeds.       (Patient has pertinent  hx/o Ulcerative Colitis  s/p  Total Colectomy subsequent reversal of colostomy in 2000 )  Current Outpatient Medications on File Prior to Visit  Medication Sig   acetaminophen (TYLENOL) 500 MG tablet Take 500 mg b every 6 hours as needed.   ALPRAZolam (XANAX) 0.25 MG tablet TAKE 1/2 TO 1 TABLET  2 TO 3 TIMES PER DAY AS NEEDED   B Complex-C  Take 1 tablet  daily.   busPIRone  10 MG tablet Take  1/2 to 1 tablet   3 x /day  for Anxiety (Patient not taking: Reported on 03/12/2022)   cephALEXin (KEFLEX) 500 MG capsule Take  1 capsule   4 x / day   with Meals & Bedtime  for Skin Infection   VITAMIN D Take 200 Units daily.    LOMOTIL 2.5-0.025 MG tablet Take 1 to 2 tablets up to 4 x /day for Diarrhea (max 8 tablets /day)   LEXAPRO 5 MG tablet Take  1 tablet   Daily  for Anxiety   ipratropium 0.06 % nasal spray Use 1 to 2 sprays each nostril 2 to 3 x /day as needed   Multiple Vitamin (MULTIVITAMIN) tablet Take 1 tabletdaily.   rosuvastatin (CRESTOR) 5 MG tablet TAKE 1 TABLET 3 TIMES PER WEEK(MONDAY, WEDNESDAY, AND FRIDAY).    Allergies  Allergen Reactions   Codeine Nausea And Vomiting   Norco [Hydrocodone-Acetaminophen]    Penicillins Nausea And Vomiting    Problem list He has DJD (degenerative joint disease); History of prediabetes; Ulcerative  colitis (HCC); Mixed hyperlipidemia; Elevated blood pressure reading without diagnosis of hypertension; Vitamin D deficiency; Medication management; BMI 26.0-26.9,adult; and Generalized anxiety disorder on their problem list.   Observations/Objective:  BP (!) 140/82   Pulse 96   Temp 97.9 F (36.6 C)   Resp 16   Ht 5\' 6"  (1.676 m)   Wt 164 lb 6.4 oz (74.6 kg)   SpO2 98%   BMI 26.53 kg/m   Exam focused on rectal area with a small superficial anal fissure at 6 o'clock. There is a 5-6 mm pink raised lesion at 2 o'clock about 1" from the rectum that is suspect for a possible granuloma or  BCE.    Assessment and Plan:   1. Anal fissure and fistula  - triamcinolone ointment 0.5 %;  Apply as directed 3 to 4 x / day   Dispense: 120 g; Refill: 1  - Ambulatory referral to  General Surgery   Follow Up Instructions:        I discussed the assessment and treatment plan with the patient. The patient was provided an opportunity to ask questions and all were answered. The patient agreed with the plan and demonstrated an understanding of the instructions.       The patient was advised to call back or seek an in-person evaluation if the symptoms worsen or if the condition fails to improve as anticipated.    Marinus Maw, MD

## 2022-09-27 ENCOUNTER — Ambulatory Visit (INDEPENDENT_AMBULATORY_CARE_PROVIDER_SITE_OTHER): Payer: Medicare PPO | Admitting: Internal Medicine

## 2022-09-27 ENCOUNTER — Encounter: Payer: Self-pay | Admitting: Internal Medicine

## 2022-09-27 VITALS — BP 140/82 | HR 96 | Temp 97.9°F | Resp 16 | Ht 66.0 in | Wt 164.4 lb

## 2022-09-27 DIAGNOSIS — K603 Anal fistula: Secondary | ICD-10-CM | POA: Diagnosis not present

## 2022-09-27 DIAGNOSIS — K602 Anal fissure, unspecified: Secondary | ICD-10-CM | POA: Diagnosis not present

## 2022-09-27 MED ORDER — TRIAMCINOLONE ACETONIDE 0.5 % EX OINT: 1.0000 | TOPICAL_OINTMENT | Freq: Two times a day (BID) | CUTANEOUS | 1 refills | Status: DC

## 2022-10-06 DIAGNOSIS — D1801 Hemangioma of skin and subcutaneous tissue: Secondary | ICD-10-CM | POA: Diagnosis not present

## 2022-10-06 DIAGNOSIS — L570 Actinic keratosis: Secondary | ICD-10-CM | POA: Diagnosis not present

## 2022-10-06 DIAGNOSIS — Z808 Family history of malignant neoplasm of other organs or systems: Secondary | ICD-10-CM | POA: Diagnosis not present

## 2022-10-06 DIAGNOSIS — L814 Other melanin hyperpigmentation: Secondary | ICD-10-CM | POA: Diagnosis not present

## 2022-10-20 DIAGNOSIS — Z8719 Personal history of other diseases of the digestive system: Secondary | ICD-10-CM | POA: Diagnosis not present

## 2022-10-20 DIAGNOSIS — Z9049 Acquired absence of other specified parts of digestive tract: Secondary | ICD-10-CM | POA: Diagnosis not present

## 2022-10-20 DIAGNOSIS — K603 Anal fistula: Secondary | ICD-10-CM | POA: Diagnosis not present

## 2022-11-22 ENCOUNTER — Encounter (HOSPITAL_BASED_OUTPATIENT_CLINIC_OR_DEPARTMENT_OTHER): Payer: Self-pay | Admitting: Surgery

## 2022-11-22 NOTE — Progress Notes (Signed)
Spoke w/ via phone for pre-op interview--- Dillon [name] needs dos----  NONE per anesthesia, surgeon orders pending.       [name] results------EKG in Epic dated 08/2022. COVID test -----patient states asymptomatic no test needed Arrive at -------0930 NPO after MN NO Solid Food.  Clear liquids from MN until---0830 Med rec completed Medications to take morning of surgery -----Xanax PRN Diabetic medication ----- Patient instructed no nail polish to be worn day of surgery Patient instructed to bring photo id and insurance card day of surgery Patient aware to have Driver (ride ) / caregiver    for 24 hours after surgery - [name] Patient Special Instructions ----- Pre-Op special Instructions ----- Patient verbalized understanding of instructions that were given at this phone interview. Patient denies chest pain, sob, fever, cough at the interview.

## 2022-11-25 ENCOUNTER — Ambulatory Visit: Payer: Medicare PPO | Admitting: Nurse Practitioner

## 2022-11-26 ENCOUNTER — Ambulatory Visit: Payer: Self-pay | Admitting: Surgery

## 2022-12-01 NOTE — Anesthesia Preprocedure Evaluation (Signed)
Anesthesia Evaluation  Patient identified by MRN, date of birth, ID band Patient awake    Reviewed: Allergy & Precautions, NPO status , Patient's Chart, lab work & pertinent test results  Airway Mallampati: II  TM Distance: >3 FB Neck ROM: Full    Dental no notable dental hx. (+) Teeth Intact, Dental Advisory Given   Pulmonary asthma    Pulmonary exam normal breath sounds clear to auscultation       Cardiovascular hypertension, Normal cardiovascular exam Rhythm:Regular Rate:Normal     Neuro/Psych  PSYCHIATRIC DISORDERS Anxiety        GI/Hepatic Neg liver ROS,,,  Endo/Other  negative endocrine ROS    Renal/GU negative Renal ROS     Musculoskeletal  (+) Arthritis ,    Abdominal   Peds  Hematology negative hematology ROS (+)   Anesthesia Other Findings All: codeine, norco and PCNS  Reproductive/Obstetrics                             Anesthesia Physical Anesthesia Plan  ASA: 2  Anesthesia Plan: General   Post-op Pain Management: Tylenol PO (pre-op)*   Induction: Intravenous  PONV Risk Score and Plan: Treatment may vary due to age or medical condition, Midazolam and Ondansetron  Airway Management Planned: Oral ETT  Additional Equipment: None  Intra-op Plan:   Post-operative Plan: Extubation in OR  Informed Consent: I have reviewed the patients History and Physical, chart, labs and discussed the procedure including the risks, benefits and alternatives for the proposed anesthesia with the patient or authorized representative who has indicated his/her understanding and acceptance.     Dental advisory given  Plan Discussed with: CRNA  Anesthesia Plan Comments:        Anesthesia Quick Evaluation

## 2022-12-02 ENCOUNTER — Ambulatory Visit (HOSPITAL_BASED_OUTPATIENT_CLINIC_OR_DEPARTMENT_OTHER): Payer: Medicare PPO | Admitting: Anesthesiology

## 2022-12-02 ENCOUNTER — Other Ambulatory Visit: Payer: Self-pay

## 2022-12-02 ENCOUNTER — Ambulatory Visit (HOSPITAL_BASED_OUTPATIENT_CLINIC_OR_DEPARTMENT_OTHER): Payer: Self-pay | Admitting: Anesthesiology

## 2022-12-02 ENCOUNTER — Encounter (HOSPITAL_BASED_OUTPATIENT_CLINIC_OR_DEPARTMENT_OTHER): Payer: Self-pay | Admitting: Surgery

## 2022-12-02 ENCOUNTER — Encounter (HOSPITAL_BASED_OUTPATIENT_CLINIC_OR_DEPARTMENT_OTHER)
Admission: RE | Disposition: A | Discharge status: Home / Self Care | Payer: Self-pay | Source: Home / Self Care | Attending: Surgery

## 2022-12-02 ENCOUNTER — Ambulatory Visit (HOSPITAL_BASED_OUTPATIENT_CLINIC_OR_DEPARTMENT_OTHER)
Admission: RE | Admit: 2022-12-02 | Discharge: 2022-12-02 | Disposition: A | Discharge status: Home / Self Care | Payer: Medicare PPO | Attending: Surgery | Admitting: Surgery

## 2022-12-02 DIAGNOSIS — K60319 Anal fistula, simple, unspecified: Secondary | ICD-10-CM | POA: Diagnosis not present

## 2022-12-02 DIAGNOSIS — I10 Essential (primary) hypertension: Secondary | ICD-10-CM | POA: Insufficient documentation

## 2022-12-02 DIAGNOSIS — E119 Type 2 diabetes mellitus without complications: Secondary | ICD-10-CM | POA: Diagnosis not present

## 2022-12-02 DIAGNOSIS — Z8719 Personal history of other diseases of the digestive system: Secondary | ICD-10-CM | POA: Diagnosis not present

## 2022-12-02 DIAGNOSIS — K60311 Anal fistula, simple, initial: Secondary | ICD-10-CM | POA: Diagnosis not present

## 2022-12-02 DIAGNOSIS — K6289 Other specified diseases of anus and rectum: Secondary | ICD-10-CM | POA: Diagnosis not present

## 2022-12-02 DIAGNOSIS — K603 Anal fistula, unspecified: Secondary | ICD-10-CM | POA: Insufficient documentation

## 2022-12-02 DIAGNOSIS — K91850 Pouchitis: Secondary | ICD-10-CM | POA: Diagnosis not present

## 2022-12-02 DIAGNOSIS — K5190 Ulcerative colitis, unspecified, without complications: Secondary | ICD-10-CM | POA: Diagnosis not present

## 2022-12-02 HISTORY — DX: Anxiety disorder, unspecified: F41.9

## 2022-12-02 HISTORY — DX: Ulcerative colitis, unspecified, without complications: K51.90

## 2022-12-02 HISTORY — PX: FLEXIBLE SIGMOIDOSCOPY: SHX5431

## 2022-12-02 HISTORY — PX: RECTAL EXAM UNDER ANESTHESIA: SHX6399

## 2022-12-02 HISTORY — PX: PLACEMENT OF SETON: SHX6029

## 2022-12-02 SURGERY — SIGMOIDOSCOPY, FLEXIBLE
Anesthesia: General

## 2022-12-02 MED ORDER — TRAMADOL HCL 50 MG PO TABS: 50.0000 mg | ORAL_TABLET | Freq: Four times a day (QID) | ORAL | 0 refills | Status: AC | PRN

## 2022-12-02 MED ORDER — PROPOFOL 10 MG/ML IV BOLUS
INTRAVENOUS | Status: AC
  Filled 2022-12-02: qty 20

## 2022-12-02 MED ORDER — BUPIVACAINE-EPINEPHRINE 0.25% -1:200000 IJ SOLN
INTRAMUSCULAR | Status: DC | PRN
  Administered 2022-12-02: 28 mL via SURGICAL_CAVITY

## 2022-12-02 MED ORDER — ONDANSETRON HCL 4 MG/2ML IJ SOLN
INTRAMUSCULAR | Status: AC
  Filled 2022-12-02: qty 2

## 2022-12-02 MED ORDER — LACTATED RINGERS IV SOLN: INTRAVENOUS | Status: DC

## 2022-12-02 MED ORDER — ONDANSETRON HCL 4 MG/2ML IJ SOLN
INTRAMUSCULAR | Status: DC | PRN
  Administered 2022-12-02: 4 mg via INTRAVENOUS

## 2022-12-02 MED ORDER — LIDOCAINE 2% (20 MG/ML) 5 ML SYRINGE
INTRAMUSCULAR | Status: DC | PRN
  Administered 2022-12-02: 100 mg via INTRAVENOUS

## 2022-12-02 MED ORDER — ACETAMINOPHEN 500 MG PO TABS
1000.0000 mg | ORAL_TABLET | ORAL | Status: AC
  Administered 2022-12-02: 1000 mg via ORAL

## 2022-12-02 MED ORDER — EPHEDRINE 5 MG/ML INJ
INTRAVENOUS | Status: AC
  Filled 2022-12-02: qty 5

## 2022-12-02 MED ORDER — LIDOCAINE HCL (PF) 2 % IJ SOLN
INTRAMUSCULAR | Status: AC
  Filled 2022-12-02: qty 5

## 2022-12-02 MED ORDER — ACETAMINOPHEN 500 MG PO TABS
ORAL_TABLET | ORAL | Status: AC
  Filled 2022-12-02: qty 2

## 2022-12-02 MED ORDER — FENTANYL CITRATE (PF) 100 MCG/2ML IJ SOLN
INTRAMUSCULAR | Status: AC
  Filled 2022-12-02: qty 2

## 2022-12-02 MED ORDER — DEXAMETHASONE SODIUM PHOSPHATE 10 MG/ML IJ SOLN
INTRAMUSCULAR | Status: AC
  Filled 2022-12-02: qty 1

## 2022-12-02 MED ORDER — FENTANYL CITRATE (PF) 100 MCG/2ML IJ SOLN
INTRAMUSCULAR | Status: DC | PRN
  Administered 2022-12-02: 50 ug via INTRAVENOUS

## 2022-12-02 MED ORDER — STERILE WATER FOR IRRIGATION IR SOLN
Status: DC | PRN
  Administered 2022-12-02: 500 mL

## 2022-12-02 MED ORDER — BUPIVACAINE LIPOSOME 1.3 % IJ SUSP: 20.0000 mL | Freq: Once | INTRAMUSCULAR | Status: DC

## 2022-12-02 MED ORDER — DEXAMETHASONE SODIUM PHOSPHATE 10 MG/ML IJ SOLN
INTRAMUSCULAR | Status: DC | PRN
  Administered 2022-12-02: 5 mg via INTRAVENOUS

## 2022-12-02 MED ORDER — EPHEDRINE SULFATE-NACL 50-0.9 MG/10ML-% IV SOSY
PREFILLED_SYRINGE | INTRAVENOUS | Status: DC | PRN
  Administered 2022-12-02: 10 mg via INTRAVENOUS

## 2022-12-02 MED ORDER — SODIUM CHLORIDE 0.9 % IV SOLN: INTRAVENOUS | Status: DC | PRN

## 2022-12-02 MED ORDER — SODIUM CHLORIDE 0.9 % IV SOLN
2.0000 g | INTRAVENOUS | Status: AC
  Administered 2022-12-02: 2 g via INTRAVENOUS

## 2022-12-02 MED ORDER — SODIUM CHLORIDE 0.9 % IV SOLN
INTRAVENOUS | Status: AC
  Filled 2022-12-02: qty 2

## 2022-12-02 MED ORDER — PROPOFOL 10 MG/ML IV BOLUS
INTRAVENOUS | Status: DC | PRN
  Administered 2022-12-02: 180 mg via INTRAVENOUS

## 2022-12-02 SURGICAL SUPPLY — 61 items
APL SKNCLS STERI-STRIP NONHPOA (GAUZE/BANDAGES/DRESSINGS) ×1
BENZOIN TINCTURE PRP APPL 2/3 (GAUZE/BANDAGES/DRESSINGS) ×2 IMPLANT
BLADE EXTENDED COATED 6.5IN (ELECTRODE) IMPLANT
BLADE SURG 10 STRL SS (BLADE) IMPLANT
BLADE SURG 15 STRL LF DISP TIS (BLADE) ×2 IMPLANT
BLADE SURG 15 STRL SS (BLADE) ×1
BRIEF MESH DISP LRG (UNDERPADS AND DIAPERS) ×2 IMPLANT
COVER BACK TABLE 60X90IN (DRAPES) ×2 IMPLANT
COVER MAYO STAND STRL (DRAPES) ×2 IMPLANT
DRAPE HYSTEROSCOPY (MISCELLANEOUS) IMPLANT
DRAPE LAPAROTOMY 100X72 PEDS (DRAPES) ×2 IMPLANT
DRAPE SHEET LG 3/4 BI-LAMINATE (DRAPES) IMPLANT
DRAPE UTILITY XL STRL (DRAPES) ×2 IMPLANT
GAUZE 4X4 16PLY ~~LOC~~+RFID DBL (SPONGE) ×2 IMPLANT
GAUZE PAD ABD 8X10 STRL (GAUZE/BANDAGES/DRESSINGS) ×2 IMPLANT
GAUZE SPONGE 4X4 12PLY STRL (GAUZE/BANDAGES/DRESSINGS) IMPLANT
GAUZE SPONGE 4X4 12PLY STRL LF (GAUZE/BANDAGES/DRESSINGS) IMPLANT
GLOVE BIO SURGEON STRL SZ7.5 (GLOVE) ×2 IMPLANT
GLOVE INDICATOR 8.0 STRL GRN (GLOVE) ×2 IMPLANT
GOWN STRL REUS W/TWL XL LVL3 (GOWN DISPOSABLE) ×2 IMPLANT
HYDROGEN PEROXIDE 16OZ (MISCELLANEOUS) IMPLANT
IV CATH 14GX2 1/4 (CATHETERS) IMPLANT
IV CATH 18G SAFETY (IV SOLUTION) IMPLANT
KIT SIGMOIDOSCOPE (SET/KITS/TRAYS/PACK) IMPLANT
KIT TURNOVER CYSTO (KITS) ×2 IMPLANT
LEGGING LITHOTOMY PAIR STRL (DRAPES) IMPLANT
LOOP VASCLR MAXI BLUE 18IN ST (MISCELLANEOUS) IMPLANT
NDL HYPO 22X1.5 SAFETY MO (MISCELLANEOUS) ×2 IMPLANT
NDL HYPO 25X1 1.5 SAFETY (NEEDLE) IMPLANT
NDL SAFETY ECLIPSE 18X1.5 (NEEDLE) IMPLANT
NEEDLE HYPO 22X1.5 SAFETY MO (MISCELLANEOUS) ×1
NEEDLE HYPO 25X1 1.5 SAFETY (NEEDLE)
NS IRRIG 500ML POUR BTL (IV SOLUTION) ×2 IMPLANT
PACK BASIN DAY SURGERY FS (CUSTOM PROCEDURE TRAY) ×2 IMPLANT
PENCIL SMOKE EVACUATOR (MISCELLANEOUS) ×2 IMPLANT
SLEEVE SCD COMPRESS KNEE MED (STOCKING) ×2 IMPLANT
SPIKE FLUID TRANSFER (MISCELLANEOUS) ×2 IMPLANT
SPONGE HEMORRHOID 8X3CM (HEMOSTASIS) IMPLANT
SPONGE SURGIFOAM ABS GEL 12-7 (HEMOSTASIS) IMPLANT
SUT CHROMIC 2 0 SH (SUTURE) IMPLANT
SUT CHROMIC 3 0 SH 27 (SUTURE) IMPLANT
SUT MNCRL AB 4-0 PS2 18 (SUTURE) IMPLANT
SUT SILK 0 TIES 10X30 (SUTURE) ×2 IMPLANT
SUT SILK 2 0 (SUTURE)
SUT SILK 2 0 SH (SUTURE) ×2 IMPLANT
SUT SILK 2-0 18XBRD TIE 12 (SUTURE) IMPLANT
SUT VIC AB 2-0 SH 27 (SUTURE)
SUT VIC AB 2-0 SH 27XBRD (SUTURE) IMPLANT
SUT VIC AB 3-0 SH 18 (SUTURE) IMPLANT
SUT VIC AB 3-0 SH 27 (SUTURE)
SUT VIC AB 3-0 SH 27X BRD (SUTURE) IMPLANT
SUT VIC AB 3-0 SH 27XBRD (SUTURE) IMPLANT
SYR 20ML LL LF (SYRINGE) IMPLANT
SYR BULB IRRIG 60ML STRL (SYRINGE) ×2 IMPLANT
SYR CONTROL 10ML LL (SYRINGE) ×2 IMPLANT
SYR TB 1ML LL NO SAFETY (SYRINGE) IMPLANT
TOWEL OR 17X24 6PK STRL BLUE (TOWEL DISPOSABLE) ×2 IMPLANT
TRAY DSU PREP LF (CUSTOM PROCEDURE TRAY) ×2 IMPLANT
TUBE CONNECTING 12X1/4 (SUCTIONS) ×2 IMPLANT
VASCULAR TIE MAXI BLUE 18IN ST (MISCELLANEOUS)
YANKAUER SUCT BULB TIP NO VENT (SUCTIONS) ×2 IMPLANT

## 2022-12-02 NOTE — Discharge Instructions (Addendum)
No acetaminophen/Tylenol until after 4:10 pm today if needed.     Post Anesthesia Home Care Instructions  Activity: Get plenty of rest for the remainder of the day. A responsible individual must stay with you for 24 hours following the procedure.  For the next 24 hours, DO NOT: -Drive a car -Advertising copywriter -Drink alcoholic beverages -Take any medication unless instructed by your physician -Make any legal decisions or sign important papers.  Meals: Start with liquid foods such as gelatin or soup. Progress to regular foods as tolerated. Avoid greasy, spicy, heavy foods. If nausea and/or vomiting occur, drink only clear liquids until the nausea and/or vomiting subsides. Call your physician if vomiting continues.  Special Instructions/Symptoms: Your throat may feel dry or sore from the anesthesia or the breathing tube placed in your throat during surgery. If this causes discomfort, gargle with warm salt water. The discomfort should disappear within 24 hours.       Information for Discharge Teaching: EXPAREL (bupivacaine liposome injectable suspension)   Pain relief is important to your recovery. The goal is to control your pain so you can move easier and return to your normal activities as soon as possible after your procedure. Your physician may use several types of medicines to manage pain, swelling, and more.  Your surgeon or anesthesiologist gave you EXPAREL(bupivacaine) to help control your pain after surgery.  EXPAREL is a local anesthetic designed to release slowly over an extended period of time to provide pain relief by numbing the tissue around the surgical site. EXPAREL is designed to release pain medication over time and can control pain for up to 72 hours. Depending on how you respond to EXPAREL, you may require less pain medication during your recovery. EXPAREL can help reduce or eliminate the need for opioids during the first few days after surgery when pain  relief is needed the most. EXPAREL is not an opioid and is not addictive. It does not cause sleepiness or sedation.   Important! A teal colored band has been placed on your arm with the date, time and amount of EXPAREL you have received. Please leave this armband in place for the full 96 hours following administration, and then you may remove the band. If you return to the hospital for any reason within 96 hours following the administration of EXPAREL, the armband provides important information that your health care providers to know, and alerts them that you have received this anesthetic.    Possible side effects of EXPAREL: Temporary loss of sensation or ability to move in the area where medication was injected. Nausea, vomiting, constipation Rarely, numbness and tingling in your mouth or lips, lightheadedness, or anxiety may occur. Call your doctor right away if you think you may be experiencing any of these sensations, or if you have other questions regarding possible side effects.  Follow all other discharge instructions given to you by your surgeon or nurse. Eat a healthy diet and drink plenty of water or other fluids.    ANORECTAL SURGERY: POST OP INSTRUCTIONS  DIET: Follow a light bland diet the first 24 hours after arrival home, such as soup, liquids, crackers, etc.  Be sure to include lots of fluids daily.  Avoid fast food or heavy meals as your are more likely to get nauseated.  Eat a low fat diet the next few days after surgery.   Some bleeding with bowel movements is expected for the first couple of days but this should stop in between bowel movements  Take your usually prescribed home medications unless otherwise directed. No foreign bodies per rectum for the next 3 months (enemas, etc)  PAIN CONTROL: It is helpful to take an over-the-counter pain medication regularly for the first few days/weeks.  Choose from the following that works best for you: Ibuprofen (Advil, etc) Three  200mg  tabs every 6 hours as needed. Acetaminophen (Tylenol, etc) 500-650mg  every 6 hours as needed NOTE: You may take both of these medications together - most patients find it most helpful when alternating between the two (i.e. Ibuprofen at 6am, tylenol at 9am, ibuprofen at 12pm ..Marland Kitchen) A  prescription for pain medication may have been prescribed for you at discharge.  Take your pain medication as prescribed.  If you are having problems/concerns with the prescription medicine, please call us for further advice.  Avoid getting constipated.  Between the surgery and the pain medications, it is common to experience some constipation.  Increasing fluid intake (64oz of water per day) and taking a fiber supplement (such as Metamucil, Citrucel, FiberCon) 1-2 times a day regularly will usually help prevent this problem from occurring.  Take Miralax (over the counter) 1-2x/day while taking a narcotic pain medication. If no bowel movement after 48hours, you may additionally take a laxative like a bottle of Milk of Magnesia which can be purchased over the counter. Avoid enemas.   Watch out for diarrhea.  If you have many loose bowel movements, simplify your diet to bland foods.  Stop any stool softeners and decrease your fiber supplement. If this worsens or does not improve, please call us.  Wash / shower every day.  If you were discharged with a dressing, you may remove this the day after your surgery. You may shower normally, getting soap/water on your wound, particularly after bowel movements.  Soaking in a warm bath filled a couple inches ("Sitz bath") is a great way to clean the area after a bowel movement and many patients find it is a way to soothe the area.  ACTIVITIES as tolerated:   You may resume regular (light) daily activities beginning the next day--such as daily self-care, walking, climbing stairs--gradually increasing activities as tolerated.  If you can walk 30 minutes without difficulty, it is  safe to try more intense activity such as jogging, treadmill, bicycling, low-impact aerobics, etc. Refrain from any heavy lifting or straining for the first 2 weeks after your procedure, particularly if your surgery was for hemorrhoids. Avoid activities that make your pain worse You may drive when you are no longer taking prescription pain medication, you can comfortably wear a seatbelt, and you can safely maneuver your car and apply brakes.  FOLLOW UP in our office Please call [facility] at [phone] to set up an appointment to see your surgeon in the office for a follow-up appointment approximately 2 weeks after your surgery. Make sure that you call for this appointment the day you arrive home to insure a convenient appointment time.  9. If you have disability or family leave forms that need to be completed, you may have them completed by your primary care physician's office; for return to work instructions, please ask our office staff and they will be happy to assist you in obtaining this documentation   When to call us [phone]: Poor pain control Reactions / problems with new medications (rash/itching, etc)  Fever over 101.5 F (38.5 C) Inability to urinate Nausea/vomiting Worsening swelling or bruising Continued bleeding from incision. Increased pain, redness, or drainage from the incision  The clinic staff is available to answer your questions during regular business hours (8:30am-5pm).  Please don't hesitate to call and ask to speak to one of our nurses for clinical concerns.   A surgeon from [facility] is always on call at the hospitals   If you have a medical emergency, go to the nearest emergency room or call 911.   [facility] [address], [address], [place], [place]  [address] MAIN: [phone] FAX: [phone] [email or web address]

## 2022-12-02 NOTE — Transfer of Care (Signed)
Immediate Anesthesia Transfer of Care Note  Patient: HONOR BERTINO  Procedure(s) Performed: FLEXIBLE SIGMOIDOSCOPY RECTAL EXAM UNDER ANESTHESIA PLACEMENT OF SETON  Patient Location: PACU  Anesthesia Type:General  Level of Consciousness: drowsy  Airway & Oxygen Therapy: o2 per [place]  Post-op Assessment: Report given to RN and Post -op Vital signs reviewed and stable  Post vital signs: Reviewed and stable  Last Vitals:  Vitals Value Taken Time  BP 133/68   Temp    Pulse 98 12/02/22 1211  Resp 21 12/02/22 1211  SpO2 96 % 12/02/22 1211  Vitals shown include unfiled device data.  Last Pain:  Vitals:   12/02/22 1002  TempSrc: Oral  PainSc: 0-No pain      Patients Stated Pain Goal: 5 (12/02/22 1002)  Complications: No notable events documented.

## 2022-12-02 NOTE — Op Note (Signed)
12/02/2022  12:53 PM  PATIENT:  Dillon [name]  67 y.o. male  Patient Care Team: Lucky Cowboy, MD as PCP - General (Internal Medicine)  PRE-OPERATIVE DIAGNOSIS:  Suspected anal fistula; personal history of inflammatory bowel disease and j-pouch  POST-OPERATIVE DIAGNOSIS:  Intersphincteric anal fistula, personal history of inflammatory bowel disease and j-pouch  PROCEDURE:   Surgical treatment of intersphincteric anal fistula with placement of draining seton Diagnostic flexible pouchoscopy ('sigmoidoscopy')  SURGEON:  Stephanie Coup. Gannon Heinzman, MD  ANESTHESIA:   local and general  COUNTS:  Sponge, needle and instrument counts were reported correct x2 at the conclusion of the operation.  EBL: 5 mL  DRAINS: Blue vessel loop seton  SPECIMEN:  Rectal cuff, random J-pouch, random  COMPLICATIONS: None  FINDINGS: Punctate opening in the right posterior position within the anal margin.  Posterior midline anal fissure.  Anal fistula courses to the posterior midline and involves external sphincter muscle.  Intersphincteric in nature and that it primarily only involves external sphincter.  There does not appear to be significant internal sphincter muscle involvement.  This was however controlled with a blue vessel loop seton and a draining configuration.  Flexible pouchoscopy demonstrates suspected rectal cuff that is 2 to 3 cm in length from the dentate line.  This was biopsied.  It is erythematous and somewhat fibrotic in nature.  Suspicious for proctitis.  Ileal J-pouch appears healthy with normal appearing villi.  Random biopsy was obtained of this as well.  We were able to intubate the neoterminal ileum/afferent limb which also appeared normal.  DISPOSITION: PACU in satisfactory condition  DESCRIPTION: The patient was identified in preop holding and taken to the OR where he was placed on the operating room table. SCDs were placed. General endotracheal anesthesia was induced without  difficulty.  He was positioned in [place] stirrups in the lithotomy position.  Pressure points were then evaluated and padded.  He was then prepped and draped in the usual sterile fashion. A surgical timeout was performed indicating the correct patient, procedure, positioning and need for preoperative antibiotics.   Perianal examination demonstrates an evident punctate opening in the right posterior position within the anal margin.  No significant fluctuance or active purulent drainage.  DRE demonstrates a palpable rectal cuff with a suspected ileal pouch anastomosis at the level of 2 to 3 cm above the dentate.  An anoscope was inserted into the anal canal.  Circumferential inspection does demonstrate a posterior midline anal fissure relatively shallow in nature.  Somewhat thickened anoderm circumferentially.  No evident stenoses.  We first turned our attention to the evident anal fistula.  External opening is carefully cannulated with a semirigid fistula probe.  This was then gently advanced.  Care was taken to avoid creating any false passages.  We are readily able to identify an internal opening at the level of the posterior midline.  This is just distal to the dentate line.  It does exit near the edge of his evident fissure.  The fistula probe was then exchanged for a blue vessel loop seton which was ultimately secured to itself using 0 silk ties and a draining configuration.  There are no other pertinent perianal findings to suggest any other fistula.  A flexible sigmoidoscope was then brought out and pouchoscopy was carried out.  This was inserted into the anal canal and the rectal cuff does appear somewhat fibrotic in nature as well as erythematous.  Random biopsies were taken of this rectal cuff.  These were submitted for  pathology.  The J-pouch was then able to be intubated.  There is not appear to be any significant stenoses at the rectal cuff and it accommodates the flexible sigmoidoscope without  difficulty.  The J-pouch is healthy in appearance.  Random biopsies were obtained of this as well.  These were submitted separately.  The neoterminal ileum/afferent limb was able to be intubated and normal in appearance.  There is a relatively small efferent limb which is also normal in appearance.  Air is then evacuated.  The area was irrigated.  Hemostasis is verified.  All sponge, needle, and instrument counts were reported correct.  He was then taken out of the lithotomy position, awakened from anesthesia, extubated, and transferred to a stretcher for transport to recovery in satisfactory condition.  I was able to update his family in person following the procedure, reviewed the findings and plans moving forward. All questions were answered to their satisfaction.

## 2022-12-02 NOTE — Anesthesia Procedure Notes (Signed)
Procedure Name: LMA Insertion Date/Time: 12/02/2022 11:18 AM  Performed by: Bishop Limbo, CRNAPre-anesthesia Checklist: Patient identified, Emergency Drugs available, Suction available and Patient being monitored Patient Re-evaluated:Patient Re-evaluated prior to induction Oxygen Delivery Method: Circle System Utilized Preoxygenation: Pre-oxygenation with 100% oxygen Induction Type: IV induction Ventilation: Mask ventilation without difficulty LMA: LMA inserted LMA Size: 5.0 Number of attempts: 1 Placement Confirmation: positive ETCO2 Tube secured with: Tape Dental Injury: Teeth and Oropharynx as per pre-operative assessment

## 2022-12-02 NOTE — Anesthesia Postprocedure Evaluation (Signed)
Anesthesia Post Note  Patient: Dillon [name]  Procedure(s) Performed: FLEXIBLE SIGMOIDOSCOPY RECTAL EXAM UNDER ANESTHESIA PLACEMENT OF SETON     Patient location during evaluation: PACU Anesthesia Type: General Level of consciousness: awake and alert Pain management: pain level controlled Vital Signs Assessment: post-procedure vital signs reviewed and stable Respiratory status: spontaneous breathing, nonlabored ventilation, respiratory function stable and patient connected to nasal cannula oxygen Cardiovascular status: blood pressure returned to baseline and stable Postop Assessment: no apparent nausea or vomiting Anesthetic complications: no   No notable events documented.  Last Vitals:  Vitals:   12/02/22 1245 12/02/22 1325  BP: (!) 140/73 135/82  Pulse: 74 71  Resp: 12 14  Temp:  (!) 36.4 C  SpO2: 95% 96%    Last Pain:  Vitals:   12/02/22 1325  TempSrc:   PainSc: 0-No pain                 Trevor Iha

## 2022-12-02 NOTE — H&P (Signed)
CC: Here today for surgery  HPI: Dillon [name] is an 67 y.o. male with history of HTN, HLD, DM, Vit D deficiency, whom is seen in the office today as a referral by Dr. Oneta [name] for evaluation of possible anal fissure.  He does have a history of remote ulcerative colitis and underwent a restorative proctocolectomy with J-pouch and ileostomy back in 2000 at [facility] with Dr. Theadore [name]. Subsequent ileostomy reversal. Since that time, he reports he has done reasonably well with his pouch. He has on average 6-8 bowel movements a day. He will as needed take Lomotil particularly if traveling and bowel movements would be inconvenient.  He reports that over the last couple of months he had some swelling and pain in the right posterior perianal position. He described this as being a boil that subsequently popped and drained. This was seen with his primary care and he is given Augmentin. He had a subsequent recurrence in the exact same spot more recently. It has since drained. He still has a punctate wound at that location.  He was sent to see [place] for further evaluation. He reports that he has not had any sort of endoscopic evaluation of his colon or pouch since his surgery in 2000. He reports that following this he did develop a perianal abscess and was told he may have a fistula. He reports that this was treated with cauterization and resolution. Up until a couple months ago, he had no evidence of any recurrence.  PMH: HTN, HLD, DM, Vit D deficiency  PSH: Restorative proctocolectomy with J-pouch, 2000 (ulcerative colitis), "anal fistula surgery" in the exact same location as his current boil.  FHx: Denies any known family history of colorectal, breast, endometrial or ovarian cancer  Social Hx: Denies use of tobacco/EtOH/illicit drug. Recently retired. Previously worked in [employer].   He denies any changes in health or health history since we met in the office. No new medications/allergies. He  states he is ready for surgery today.  Past Medical History:  Diagnosis Date   Anxiety    DJD (degenerative joint disease)    Elevated hemoglobin A1c    Hyperlipidemia    Hypertension     Past Surgical History:  Procedure Laterality Date   COLON SURGERY  2000, reversal 2001   coloectomy, with reversal and now a J pouch   ELBOW LIGAMENT RECONSTRUCTION Right July 2014   Dr Dillon [name]   ELBOW LIGAMENT RECONSTRUCTION Left Sept 2016   Dr Dillon [name]   ELBOW SURGERY Right 2014   HAND SURGERY Right 1975   HAND SURGERY Right 1975    Family History  Problem Relation Age of Onset   Diabetes Mother    Arthritis Mother        Rheumatoid   Heart disease Father    Gout Father    Cancer Father        melanoma   Cancer Sister        Breast   Suicidality Sister     Social:  reports that he has never smoked. He has never used smokeless tobacco. He reports current alcohol use. He reports that he does not use drugs.  Allergies:  Allergies  Allergen Reactions   Codeine Nausea And Vomiting   Norco [Hydrocodone-Acetaminophen]    Penicillins Nausea And Vomiting    Medications: I have reviewed the patient's current medications.  No results found for this or any previous visit (from the past 48 hour(s)).  No results found.  PE Height 5\' 6"  (1.676 m), weight 76.2 kg. Constitutional: NAD; conversant Eyes: Moist conjunctiva; no lid lag; anicteric Lungs: Normal respiratory effort CV: RRR Psychiatric: Appropriate affect  No results found for this or any previous visit (from the past 48 hour(s)).  No results found.  A/P: Dillon [name] is an 67 y.o. male with hx of HTN, HLD, DM, Vit D deficiency, UC (s/p rPC with J pouch 2000) here for surgery for what I suspect to be an anal fistula  -The anatomy and physiology of the anal canal was discussed with the patient with associated pictures. The pathophysiology of anal abscess and fistula was discussed at length with associated pictures  and illustrations using the ASCRS handout trifold -We have reviewed options going forward. Given that he has had 2 abscesses at the same spot and what sounds like a remote history of an anal fistula at the exact same location right after his J-pouch surgery, discussed that he likely does have an anal fistula. We discussed options going forward to further evaluate this. He would like to proceed with this.  -We discussed anorectal exam under anesthesia with surgical treatment of anal fistula and likely placement of seton; diagnostic flexible sigmoidoscopy/pouchoscopy to rule out/evaluate for other causes such as Crohn's disease.  -The planned procedure, material risks (including, but not limited to, pain, bleeding, infection, scarring, need for blood transfusion, damage to anal sphincter, incontinence of gas and/or stool, need for additional procedures, anal stenosis, rare cases of pelvic sepsis which in severe cases may require things like ileostomy, recurrence, pneumonia, heart attack, stroke, death) benefits and alternatives to surgery were discussed at length.  -We spent time discussing what a seton is and general expectations therein following. We discussed this may remain in place for many months. The patient's questions were answered to his satisfaction, he voiced understanding and elected to proceed with surgery. Additionally, we discussed typical postoperative expectations and the recovery process.   Dillon Olp, MD [facility], [facility]

## 2022-12-03 LAB — SURGICAL PATHOLOGY

## 2022-12-06 ENCOUNTER — Encounter (HOSPITAL_BASED_OUTPATIENT_CLINIC_OR_DEPARTMENT_OTHER): Payer: Self-pay | Admitting: Surgery

## 2022-12-10 ENCOUNTER — Encounter: Payer: Self-pay | Admitting: Gastroenterology

## 2023-01-05 ENCOUNTER — Other Ambulatory Visit (INDEPENDENT_AMBULATORY_CARE_PROVIDER_SITE_OTHER): Payer: Medicare PPO

## 2023-01-05 ENCOUNTER — Ambulatory Visit: Payer: Medicare PPO | Admitting: Gastroenterology

## 2023-01-05 ENCOUNTER — Encounter: Payer: Self-pay | Admitting: Gastroenterology

## 2023-01-05 VITALS — BP 122/76 | HR 80 | Ht 66.0 in | Wt 167.0 lb

## 2023-01-05 DIAGNOSIS — K91850 Pouchitis: Secondary | ICD-10-CM

## 2023-01-05 DIAGNOSIS — K603 Anal fistula, unspecified: Secondary | ICD-10-CM

## 2023-01-05 DIAGNOSIS — K6289 Other specified diseases of anus and rectum: Secondary | ICD-10-CM

## 2023-01-05 DIAGNOSIS — K5190 Ulcerative colitis, unspecified, without complications: Secondary | ICD-10-CM

## 2023-01-05 DIAGNOSIS — R197 Diarrhea, unspecified: Secondary | ICD-10-CM

## 2023-01-05 LAB — SEDIMENTATION RATE: Sed Rate: 19 mm/h (ref 0–20)

## 2023-01-05 LAB — HIGH SENSITIVITY CRP: CRP, High Sensitivity: 1.69 mg/L (ref 0.000–5.000)

## 2023-01-05 MED ORDER — CIPROFLOXACIN HCL 500 MG PO TABS: 500.0000 mg | ORAL_TABLET | Freq: Two times a day (BID) | ORAL | 0 refills | Status: DC

## 2023-01-05 MED ORDER — METRONIDAZOLE 500 MG PO TABS: 500.0000 mg | ORAL_TABLET | Freq: Three times a day (TID) | ORAL | 0 refills | Status: DC

## 2023-01-05 MED ORDER — NA SULFATE-K SULFATE-MG SULF 17.5-3.13-1.6 GM/177ML PO SOLN: 1.0000 | Freq: Once | ORAL | 0 refills | Status: AC

## 2023-01-05 NOTE — Patient Instructions (Addendum)
Your provider has requested that you go to the basement level for lab work before leaving today. Press "B" on the elevator. The lab is located at the first door on the left as you exit the elevator.  Please start the medication AFTER stool studies are complete   Your provider has ordered "Diatherix" stool testing for you. You have received a kit from our office today containing all necessary supplies to complete this test. Please carefully read the stool collection instructions provided in the kit before opening the accompanying materials. In addition, be sure to place the label from the top right corner of the laboratory request sheet onto the "puritan opti-swab" tube that is supplied in the kit. This label should include your full name and date of birth. After completing the test, you should secure the purtian tube into the specimen biohazard bag. The laboratory request information sheet (including date and time of specimen collection) should be placed into the outside pocket of the specimen biohazard bag and returned to the [facility] lab with 2 days of collection.   If the laboratory information sheet specimen date and time are not filled out, the test will NOT be performed.  _______________________________________________________  If your blood pressure at your visit was 140/90 or greater, please contact your primary care physician to follow up on this.  _______________________________________________________  If you are age 59 or older, your body mass index should be between 23-30. Your Body mass index is 26.95 kg/m. If this is out of the aforementioned range listed, please consider follow up with your Primary Care Provider.  If you are age 35 or younger, your body mass index should be between 19-25. Your Body mass index is 26.95 kg/m. If this is out of the aformentioned range listed, please consider follow up with your Primary Care Provider.    ________________________________________________________  The [place] [facility] providers would like to encourage you to use [facility] to communicate with providers for non-urgent requests or questions.  Due to long hold times on the telephone, sending your provider a message by [facility] may be a faster and more efficient way to get a response.  Please allow 48 business hours for a response.  Please remember that this is for non-urgent requests.  _______________________________________________________ It was a pleasure to see you today!  Thank you for trusting me with your gastrointestinal care!

## 2023-01-05 NOTE — Progress Notes (Signed)
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 [name] on 12/06/2022 for anal fistula and history of inflammatory bowel disease.  IBD history:    Per patient diagnosed in early 1990's when he was evaluated for rectal bleeding, weight loss, and abdominal pain. He reports over the course of the next 10 years and his UC was managed with Asacol, Balsalazide, and multiple doses of prednisone. 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      Per Dr. Lucilla Lame note (12/24/2022): "He does have a history of remote ulcerative colitis and underwent a restorative proctocolectomy with J-pouch and ileostomy back in 2000 at [facility] with Dr. Theadore Nan. Subsequent ileostomy reversal. Since that time, he reports he has done reasonably well with his pouch.         Patient reports that following this in 2002 he did develop a perianal abscess and was told he may have a fistula. He reports that this was treated with cauterization and resolution. Up until a couple months ago, he had no evidence of any recurrence.    He reports that over the last couple of months he had some swelling and pain in the right posterior perianal position. He described this as being a boil that subsequently popped and drained. This was seen with his primary care and he is given Augmentin. He had a subsequent recurrence in the exact same spot more recently. It has since drained. He still has a punctate wound at that location.     He was sent to see [place] for further evaluation. He reports that he has not had any sort of endoscopic evaluation of his colon or pouch since his surgery in 2000." OR (12/02/22) Surgical treatment of intersphincteric anal fistula with  placement of draining seton No antibiotics given after surgery.  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 Last round of treatment was in 2022.  Interval history: Patient reports he is having 6-10 bowel movements per day, toothpaste consistency. He does take Lomotil prn for trips or "bad days". Reports this is his normal for past 20 years. Rectal discomfort and anal itching, using hydrocortisone cream helps. No rectal bleeding. Patient reports Diastasis recti that causes no pain but he feels "tightness" with movement.He has been evaluated by general surgery and no plans to do cosmetic procedure at this time. Patient reports urgency, no incontinence. No nausea, vomiting, or weight loss. No GERD or dysphagia. No extraintestinal manifestations. Reports Paternal grandfather has ulcerative colitis.No family hx of CA. Alcohol use 4 to 5 days out of week , 3 cocktails on average a night. Nonsmoker.  Wt Readings from Last 3 Encounters:  01/05/23 167 lb (75.8 kg)  12/02/22 163 lb 9.6 oz (74.2 kg)  09/27/22 164 lb 6.4 oz (74.6 kg)    Past Medical History:  Diagnosis Date   Anal fissure    Anxiety    DJD (degenerative joint disease)    Elevated hemoglobin A1c    Hyperlipidemia    Hypertension    UC (ulcerative colitis) (HCC)    Ulcerative colitis (HCC)    s/p colectomy   Past Surgical History:  Procedure Laterality Date   COLON SURGERY  2000, reversal 2001   coloectomy, with reversal and now a J pouch   ELBOW LIGAMENT RECONSTRUCTION Right July 2014   Dr Orlan Leavens   ELBOW LIGAMENT RECONSTRUCTION Left Sept 2016   Dr Orlan Leavens   ELBOW SURGERY Right 2014   FLEXIBLE SIGMOIDOSCOPY N/A 12/02/2022   Procedure: FLEXIBLE SIGMOIDOSCOPY;  Surgeon: Andria Meuse, MD;  Location: [facility];  Service: General;  Laterality: N/A;   HAND SURGERY Right 1975   HAND SURGERY  Right 1975   PLACEMENT OF SETON N/A 12/02/2022   Procedure: PLACEMENT OF SETON;  Surgeon: Andria Meuse, MD;  Location: [facility];  Service: General;  Laterality: N/A;   RECTAL EXAM UNDER ANESTHESIA N/A 12/02/2022   Procedure: RECTAL EXAM UNDER ANESTHESIA;  Surgeon: Andria Meuse, MD;  Location: [facility];  Service: General;  Laterality: N/A;   Current Outpatient Medications  Medication Sig Dispense Refill   acetaminophen (TYLENOL) 500 MG tablet Take 500 mg by mouth every 6 (six) hours as needed.     ALPRAZolam (XANAX) 0.25 MG tablet TAKE 1/2 TO 1 TABLET BY MOUTH 2 TO 3 TIMES PER DAY AS NEEDED FOR ANXIETY 60 tablet 0   B Complex-C (B-COMPLEX WITH VITAMIN C) tablet Take 1 tablet by mouth daily.     busPIRone (BUSPAR) 10 MG tablet Take  1/2 to 1 tablet   3 x /day  for Anxiety 270 tablet 1   cephALEXin (KEFLEX) 500 MG capsule Take  1 capsule   4 x / day   with Meals & Bedtime  for Skin Infection 40 capsule 2   Cholecalciferol (VITAMIN D PO) Take 200 Units by mouth daily.      diphenoxylate-atropine (LOMOTIL) 2.5-0.025 MG tablet Take 1 to 2 tablets up to 4 x /day for Diarrhea (max 8 tablets /day) 120 tablet 0   escitalopram (LEXAPRO) 5 MG tablet Take  1 tablet   Daily  for Anxiety (Patient taking differently: PATIENT NOT TAKING) 90 tablet 3   ipratropium (ATROVENT) 0.06 % nasal spray Use 1 to 2 sprays each nostril 2 to 3 x /day as needed 45 mL 3   Multiple Vitamin (MULTIVITAMIN) tablet Take 1 tablet by mouth daily.     rosuvastatin (CRESTOR) 5 MG tablet TAKE 1 TABLET BY MOUTH 3 TIMES PER WEEK(MONDAY, WEDNESDAY, AND FRIDAY). 39 tablet 3   triamcinolone ointment (KENALOG) 0.5 % Apply 1 Application topically 2 (two) times daily. Apply as directed 3 to 4 x / day 120 g 1   No current facility-administered medications for this visit.   Allergies as of 01/05/2023 - Review Complete 01/05/2023  Allergen Reaction Noted   Codeine Nausea And Vomiting  04/08/2015   Norco [hydrocodone-acetaminophen] Nausea And Vomiting 12/17/2012   Penicillins Nausea And Vomiting 12/17/2012   Family History  Problem Relation Age of Onset   Diabetes Mother    Arthritis Mother        Rheumatoid   Heart disease Father    Gout Father    Cancer Father        melanoma   Cancer Sister        Breast   Suicidality Sister    Ulcerative colitis Maternal Grandfather    Liver disease Neg Hx    Colon cancer Neg Hx    Esophageal cancer Neg Hx    Review of Systems:    Constitutional: No weight loss, fever, chills, weakness or fatigue HEENT: Eyes: No change in vision  Ears, Nose, Throat:  No change in hearing or congestion Skin: No rash or itching Cardiovascular: No chest pain, chest pressure or palpitations   Respiratory: No SOB or cough Gastrointestinal: See HPI and otherwise negative Genitourinary: No dysuria or change in urinary frequency Neurological: No headache, dizziness or syncope Musculoskeletal: No new muscle or joint pain Hematologic: No bleeding or bruising Psychiatric: No history of depression or anxiety   Physical Exam:  Vital signs: BP 122/76   Pulse 80   Ht 5\' 6"  (1.676 m)   Wt 167 lb (75.8 kg)   BMI 26.95 kg/m   Constitutional: Pleasant Caucasian male appears to be in NAD, Well developed, Well nourished, alert and cooperative Throat: Oral cavity and pharynx without inflammation, swelling or lesion.  Respiratory: Respirations even and unlabored. Lungs clear to auscultation bilaterally.   No wheezes, crackles, or rhonchi.  Cardiovascular: Normal S1, S2. Regular rate and rhythm. No peripheral edema, cyanosis or pallor.  Gastrointestinal:  Soft, nondistended, nontender. No rebound or guarding. Normal bowel sounds. No appreciable masses or hepatomegaly. Diastasis recti midline. Rectal: Seton noted posteriorly with erythematous mucosa. No DRE performed.  Skin:   Dry and intact without significant lesions or  rashes. Psychiatric: Oriented to person, place and time. Demonstrates good judgement and reason without abnormal affect or behaviors.  RELEVANT LABS AND IMAGING: CBC    Latest Ref Rng & Units 08/24/2022    2:58 PM 08/20/2021   11:32 AM 03/11/2021   11:33 AM  CBC  WBC 3.8 - 10.8 Thousand/uL 7.4  7.7  5.2   Hemoglobin 13.2 - 17.1 g/dL 16.1  09.6  04.5   Hematocrit 38.5 - 50.0 % 42.2  46.2  42.5   Platelets 140 - 400 Thousand/uL 280  294  269   CMP     Component Value Date/Time   NA 140 08/24/2022 1458   K 3.9 08/24/2022 1458   CL 103 08/24/2022 1458   CO2 28 08/24/2022 1458   GLUCOSE 90 08/24/2022 1458   BUN 11 08/24/2022 1458   CREATININE 0.91 08/24/2022 1458   CALCIUM 9.4 08/24/2022 1458   PROT 7.4 08/24/2022 1458   ALBUMIN 4.3 02/16/2016 0935   AST 26 08/24/2022 1458   ALT 26 08/24/2022 1458   ALKPHOS 70 02/16/2016 0935   BILITOT 0.6 08/24/2022 1458   GFRNONAA 91 05/01/2020 0848   GFRAA 106 05/01/2020 0848   08/24/2022 labs show: Vitamin D 43, TSH 0.98, Mg 1.8, BUN 11/creatinine 0.91, normal LFTs, WBC 7.4, hemoglobin 14.4, platelets 280, PSA 0.89  03/19/21 US Abdomen IMPRESSION: Essentially unremarkable abdominal sonogram  OR 12/02/22 Surgical treatment of intersphincteric anal fistula with placement of draining seton Diagnostic flexible pouchoscopy ('sigmoidoscopy') FINDINGS: Punctate opening in the right posterior position within the anal margin. Posterior midline anal fissure. Anal fistula courses to the posterior midline and involves external sphincter muscle. Intersphincteric in nature and that it primarily only involves external sphincter. There does not appear to be significant internal sphincter muscle involvement. This was however controlled with a blue vessel loop seton and a draining configuration.  Flexible pouchoscopy demonstrates suspected rectal cuff that is 2 to 3 cm in length from the dentate line. This was biopsied. It is erythematous and somewhat fibrotic  in nature. Suspicious for proctitis. Ileal J-pouch appears healthy with normal appearing villi. Random biopsy was obtained of this as well. We were able to intubate the neoterminal ileum/afferent limb which also appeared normal.  PATH: A. J POUCH, BIOPSY:  - Focal acute nonspecific pouchitis  -  Serial negative for features of chronicity, granulomas or dysplasia   B. RECTAL CUFF, BIOPSY:  - Mildly active chronic proctitis, consistent with patient's clinical  history of ulcerative colitis  - Negative for granulomas or dysplasia   Assessment: Encounter Diagnoses  Name Primary?   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  His most recent flexible sigmoidoscopy revealed focal acute nonspecific pouchitis and mildly active chronic proctitis.  We will order baseline inflammatory markers along with ruling out other possible causes of inflammation with complaints of new onset urgency. We will go ahead and treat the patients pouchitis with subsequent management Ciprofloxacin and Flagyl.  We will do a follow-up flexible sigmoidoscopy to reevaluate the J-pouch and rectal cuff.  We will then collaborate with our IBD specialist colleague Dr. Doy Hutching and assess the need if any for alternative therapies and interventions.   -CRP high sensitivity, ESR, TTG IgA, IgA, -Fecal calprotectin -GI pathogen panel, C diff, elastase -Cipro 500 mg 1 tab every 12 hours for 2 weeks  #28 - Flagyl 500 mg every 12 hours for 4 weeks  #56 -Follow up flexible sigmoidoscopy after completes antibiotic therapy end of January. -Follow-up with Dr. Doy Hutching first or second week of February   Deanna May, [facility] 01/05/2023, 9:55 AM  The APP and I saw this patient together, with the APP acting as my scribe.  I have reviewed the above documentation for  accuracy and completeness and I agree with the above documentation. Probable evolution of his IBD to a Crohn's-like picture with cuff-itis and anal fistula recently treated with EUA and seton placement. Treatment plan as above, then reevaluate with pouchoscopy and a subsequent evaluation by our IBD specialist.  Too soon to know this for sure until we see response to therapy, but he could be heading toward biologic therapy.  Chakita Mcgraw L. Myrtie Neither, MD   Cc: Lucky Cowboy, MD

## 2023-01-06 DIAGNOSIS — K603 Anal fistula, unspecified: Secondary | ICD-10-CM | POA: Diagnosis not present

## 2023-01-06 DIAGNOSIS — K91850 Pouchitis: Secondary | ICD-10-CM | POA: Diagnosis not present

## 2023-01-06 LAB — IGA: Immunoglobulin A: 212 mg/dL (ref 70–320)

## 2023-01-06 LAB — TISSUE TRANSGLUTAMINASE, IGA: (tTG) Ab, IgA: <1 U/mL

## 2023-01-09 LAB — GI PROFILE, STOOL, PCR

## 2023-01-09 LAB — CALPROTECTIN, FECAL: Calprotectin, Fecal: 72 ug/g (ref 0–120)

## 2023-01-12 LAB — PANCREATIC ELASTASE, FECAL: Pancreatic Elastase-1, Stool: >800 ug/g (ref >200)

## 2023-01-17 ENCOUNTER — Encounter: Payer: Self-pay | Admitting: Gastroenterology

## 2023-02-07 ENCOUNTER — Encounter: Payer: Self-pay | Admitting: Gastroenterology

## 2023-02-10 ENCOUNTER — Other Ambulatory Visit: Payer: Self-pay | Admitting: Surgery

## 2023-02-10 DIAGNOSIS — Z9889 Other specified postprocedural states: Secondary | ICD-10-CM

## 2023-02-10 DIAGNOSIS — R1900 Intra-abdominal and pelvic swelling, mass and lump, unspecified site: Secondary | ICD-10-CM

## 2023-02-15 ENCOUNTER — Encounter: Payer: Self-pay | Admitting: Gastroenterology

## 2023-02-22 ENCOUNTER — Encounter: Payer: Self-pay | Admitting: Gastroenterology

## 2023-02-22 ENCOUNTER — Ambulatory Visit: Payer: Medicare PPO | Admitting: Gastroenterology

## 2023-02-22 VITALS — BP 109/66 | HR 66 | Temp 97.3°F | Resp 19 | Ht 66.0 in | Wt 167.0 lb

## 2023-02-22 DIAGNOSIS — K91850 Pouchitis: Secondary | ICD-10-CM | POA: Diagnosis not present

## 2023-02-22 DIAGNOSIS — Z980 Intestinal bypass and anastomosis status: Secondary | ICD-10-CM

## 2023-02-22 DIAGNOSIS — K529 Noninfective gastroenteritis and colitis, unspecified: Secondary | ICD-10-CM

## 2023-02-22 DIAGNOSIS — F419 Anxiety disorder, unspecified: Secondary | ICD-10-CM | POA: Diagnosis not present

## 2023-02-22 DIAGNOSIS — K603 Anal fistula, unspecified: Secondary | ICD-10-CM | POA: Diagnosis not present

## 2023-02-22 DIAGNOSIS — E669 Obesity, unspecified: Secondary | ICD-10-CM | POA: Diagnosis not present

## 2023-02-22 DIAGNOSIS — K56609 Unspecified intestinal obstruction, unspecified as to partial versus complete obstruction: Secondary | ICD-10-CM

## 2023-02-22 MED ORDER — SODIUM CHLORIDE 0.9 % IV SOLN: 500.0000 mL | Freq: Once | INTRAVENOUS | Status: DC

## 2023-02-22 NOTE — Progress Notes (Signed)
History and Physical:  This patient presents for endoscopic testing for: Encounter Diagnoses  Name Primary?   Pouchitis (HCC) Yes   Anal fistula     Clinical details in 01/05/23 office consult note. Hx UC with colectomy and later development of pouchitis. Recent ESR, CRP nml, celiac Ab neg.  Calprotectin nml at 72  Patient is otherwise without complaints or active issues today.   Past Medical History: Past Medical History:  Diagnosis Date   Anal fissure    Anxiety    DJD (degenerative joint disease)    Elevated hemoglobin A1c    Hyperlipidemia    Hypertension    UC (ulcerative colitis) (HCC)    Ulcerative colitis (HCC)    s/p colectomy     Past Surgical History: Past Surgical History:  Procedure Laterality Date   COLON SURGERY  2000, reversal 2001   coloectomy, with reversal and now a J pouch   ELBOW LIGAMENT RECONSTRUCTION Right July 2014   Dr Orlan Leavens   ELBOW LIGAMENT RECONSTRUCTION Left Sept 2016   Dr Orlan Leavens   ELBOW SURGERY Right 2014   FLEXIBLE SIGMOIDOSCOPY N/A 12/02/2022   Procedure: FLEXIBLE SIGMOIDOSCOPY;  Surgeon: Andria Meuse, MD;  Location: [facility];  Service: General;  Laterality: N/A;   HAND SURGERY Right 1975   HAND SURGERY Right 1975   PLACEMENT OF SETON N/A 12/02/2022   Procedure: PLACEMENT OF SETON;  Surgeon: Andria Meuse, MD;  Location: [facility];  Service: General;  Laterality: N/A;   RECTAL EXAM UNDER ANESTHESIA N/A 12/02/2022   Procedure: RECTAL EXAM UNDER ANESTHESIA;  Surgeon: Andria Meuse, MD;  Location: [facility];  Service: General;  Laterality: N/A;    Allergies: Allergies  Allergen Reactions   Codeine Nausea And Vomiting   Norco [Hydrocodone-Acetaminophen] Nausea And Vomiting    Per pt can take tylenol   Penicillins Nausea And Vomiting    Childhood rxn    Outpatient Meds: Current Outpatient Medications  Medication Sig Dispense Refill   acetaminophen  (TYLENOL) 500 MG tablet Take 500 mg by mouth every 6 (six) hours as needed.     ALPRAZolam (XANAX) 0.25 MG tablet TAKE 1/2 TO 1 TABLET BY MOUTH 2 TO 3 TIMES PER DAY AS NEEDED FOR ANXIETY 60 tablet 0   B Complex-C (B-COMPLEX WITH VITAMIN C) tablet Take 1 tablet by mouth daily.     busPIRone (BUSPAR) 10 MG tablet Take  1/2 to 1 tablet   3 x /day  for Anxiety (Patient not taking: Reported on 02/22/2023) 270 tablet 1   cephALEXin (KEFLEX) 500 MG capsule Take  1 capsule   4 x / day   with Meals & Bedtime  for Skin Infection (Patient not taking: Reported on 02/22/2023) 40 capsule 2   Cholecalciferol (VITAMIN D PO) Take 200 Units by mouth daily.      diphenoxylate-atropine (LOMOTIL) 2.5-0.025 MG tablet Take 1 to 2 tablets up to 4 x /day for Diarrhea (max 8 tablets /day) 120 tablet 0   escitalopram (LEXAPRO) 5 MG tablet Take  1 tablet   Daily  for Anxiety (Patient not taking: Reported on 02/22/2023) 90 tablet 3   ipratropium (ATROVENT) 0.06 % nasal spray Use 1 to 2 sprays each nostril 2 to 3 x /day as needed 45 mL 3   meloxicam (MOBIC) 7.5 MG tablet Take 7.5 mg by mouth as needed.     metroNIDAZOLE (FLAGYL) 500 MG tablet Take 1 tablet (500 mg total) by mouth 3 (  three) times daily. 56 tablet 0   Multiple Vitamin (MULTIVITAMIN) tablet Take 1 tablet by mouth daily.     rosuvastatin (CRESTOR) 5 MG tablet TAKE 1 TABLET BY MOUTH 3 TIMES PER WEEK(MONDAY, WEDNESDAY, AND FRIDAY). 39 tablet 3   triamcinolone ointment (KENALOG) 0.5 % Apply 1 Application topically 2 (two) times daily. Apply as directed 3 to 4 x / day 120 g 1   Current Facility-Administered Medications  Medication Dose Route Frequency Provider Last Rate Last Admin   0.9 %  sodium chloride infusion  500 mL Intravenous Once Danis, Starr Lake III, MD          ___________________________________________________________________ Objective   Exam:  BP (!) 141/81   Pulse 77   Temp (!) 97.3 F (36.3 C) (Temporal)   Resp 12   Ht 5\' 6"  (1.676 m)   Wt 167  lb (75.8 kg)   SpO2 97%   BMI 26.95 kg/m   CV: regular , S1/S2 Resp: clear to auscultation bilaterally, normal RR and effort noted GI: soft, no tenderness, with active bowel sounds.   Assessment: Encounter Diagnoses  Name Primary?   Pouchitis (HCC) Yes   Anal fistula      Plan: Pouchoscopy  The benefits and risks of the planned procedure were described in detail with the patient or (when appropriate) their health care proxy.  Risks were outlined as including, but not limited to, bleeding, infection, perforation, adverse medication reaction leading to cardiac or pulmonary decompensation, pancreatitis (if ERCP).  The limitation of incomplete mucosal visualization was also discussed.  No guarantees or warranties were given.  The patient is appropriate for an endoscopic procedure in the ambulatory setting.   - Amada Jupiter, MD

## 2023-02-22 NOTE — Progress Notes (Signed)
Report to PACU, RN, vss, BBS= Clear.  

## 2023-02-22 NOTE — Progress Notes (Signed)
Called to room to assist during endoscopic procedure.  Patient ID and intended procedure confirmed with present staff. Received instructions for my participation in the procedure from the performing physician.  

## 2023-02-22 NOTE — Patient Instructions (Addendum)
- Resume previous diet.                           - Await pathology results.                           -Clinic follow-up will be arranged. Begin process                            for insurance approval for anti-TNF therapy (Humira)  YOU HAD AN ENDOSCOPIC PROCEDURE TODAY AT THE [facility]:   Refer to the procedure report that was given to you for any specific questions about what was found during the examination.  If the procedure report does not answer your questions, please call your gastroenterologist to clarify.  If you requested that your care partner not be given the details of your procedure findings, then the procedure report has been included in a sealed envelope for you to review at your convenience later.  YOU SHOULD EXPECT: Some feelings of bloating in the abdomen. Passage of more gas than usual.  Walking can help get rid of the air that was put into your GI tract during the procedure and reduce the bloating. If you had a lower endoscopy (such as a colonoscopy or flexible sigmoidoscopy) you may notice spotting of blood in your stool or on the toilet paper. If you underwent a bowel prep for your procedure, you may not have a normal bowel movement for a few days.  Please Note:  You might notice some irritation and congestion in your nose or some drainage.  This is from the oxygen used during your procedure.  There is no need for concern and it should clear up in a day or so.  SYMPTOMS TO REPORT IMMEDIATELY:  Following lower endoscopy (colonoscopy or flexible sigmoidoscopy):  Excessive amounts of blood in the stool  Significant tenderness or worsening of abdominal pains  Swelling of the abdomen that is new, acute  Fever of 100F or higher   For urgent or emergent issues, a gastroenterologist can be reached at any hour by calling [phone]. Do not use MyChart messaging for urgent concerns.    DIET:  We do recommend a small meal at first,  but then you may proceed to your regular diet.  Drink plenty of fluids but you should avoid alcoholic beverages for 24 hours.  ACTIVITY:  You should plan to take it easy for the rest of today and you should NOT DRIVE or use heavy machinery until tomorrow (because of the sedation medicines used during the test).    FOLLOW UP: Our staff will call the number listed on your records the next business day following your procedure.  We will call around 7:15- 8:00 am to check on you and address any questions or concerns that you may have regarding the information given to you following your procedure. If we do not reach you, we will leave a message.     If any biopsies were taken you will be contacted by phone or by letter within the next 1-3 weeks.  Please call us at [phone] if you have not heard about the biopsies in 3 weeks.    SIGNATURES/CONFIDENTIALITY: You and/or your care partner have signed paperwork which will be entered into your electronic medical record.  These signatures attest to the fact that that the information  above on your After Visit Summary has been reviewed and is understood.  Full responsibility of the confidentiality of this discharge information lies with you and/or your care-partner.

## 2023-02-22 NOTE — Op Note (Signed)
[facility] Patient Name: Dillon [name] Procedure Date: 02/22/2023 2:37 PM MRN: [id number] Endoscopist: Sherilyn Cooter L. Myrtie Neither , MD, [id number] Age: 68 Referring MD:  Date of Birth: [birth year] Gender: Male Account #: [id number] Procedure:                Pouchoscopy Indications:              History of total proctocolectomy, perianal fistula                           Clinical details in recent office consult note.                           Previous proctocolectomy with J-pouch for UC,                            recurrent episodes of pouchitis with pain and                            urgency that respond to the ciprofloxacin Flagyl.                           Intraoperative pouchoscopy (distal pouch) and seton                            placement November 2024. Procedure:                Pre-Anesthesia Assessment:                           - Prior to the procedure, a History and Physical                            was performed, and patient medications and                            allergies were reviewed. The patient's tolerance of                            previous anesthesia was also reviewed. The risks                            and benefits of the procedure and the sedation                            options and risks were discussed with the patient.                            All questions were answered, and informed consent                            was obtained. Prior Anticoagulants: The patient has                            taken no anticoagulant or antiplatelet agents. ASA  Grade Assessment: II - A patient with mild systemic                            disease. After reviewing the risks and benefits,                            the patient was deemed in satisfactory condition to                            undergo the procedure.                           After obtaining informed consent, the endoscope was                            passed under  direct vision. Throughout the                            procedure, the patient's blood pressure, pulse, and                            oxygen saturations were monitored continuously. The                            Olympus Scope SN [id number] was introduced through the                            anus and advanced to the the ileoanal pouch and                            into the neo-terminal ileum. After obtaining                            informed consent, the endoscope was passed under                            direct vision. Throughout the procedure, the                            patient's blood pressure, pulse, and oxygen                            saturations were monitored continuously.The                            procedure was somewhat difficult due to                            post-surgical anatomy. The patient tolerated the                            procedure well. The quality of the bowel  preparation was good. Scope In: 2:56:28 PM Scope Out: 3:15:37 PM Total Procedure Duration: 0 hours 19 minutes 9 seconds  Findings:                 Patient is status-post colon resection with an                            ileal pouch-anal anastomosis.                           The digital rectal exam findings include posterior                            anal fistula and seton.                           Patchy inflammation was found in the j-pouch and in                            the neo-terminal Ileum.                           At 3 to 5 cm from the anal verge there was a                            stricture that could not be passed with the adult                            colonoscope. This was inflamed granular mucosa and                            erosions. This area was easily passed with an upper                            endoscope, thus allowing examination of the entire                            J-pouch and neoterminal ileum to approximately 40                             cm from the anus. At that point the ileum became                            more tortuous, precluding further scope advancement.                           After the initial stricture described above, there                            was some mildly inflamed and ulcerated mucosa until                            about 18-20 cm from the anus, at which point  another and more severely ulcerated stricture was                            encountered. It was also traversed with the scope                            and the scope advanced into the neoterminal ileum.                            More progressive Crohn's related inflammation and                            ulcers were seen in a patchy fashion on 12 out of                            30 cm distance, above which (to the extent of                            insertion) mucosa was normal.                           The inflammation was graded as Rutgeerts Score i4                            (diffuse inflammation with large deep lesions                            and/or narrowing), and. Biopsies were taken with a                            cold forceps for histology. Complications:            No immediate complications. Estimated Blood Loss:     Estimated blood loss was minimal. 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                           - Crohn's disease with ileitis and pouchitis.                            Severe inflammation was found. This was graded as                            Rutgeerts Score i4 (diffuse inflammation with large                            deep lesions and/or narrowing). Biopsied. Recommendation:           - Patient has a contact number available for                            emergencies. The signs and symptoms of potential  delayed complications were discussed with the                            patient. Return to  normal activities tomorrow.                            Written discharge instructions were provided to the                            patient.                           - Resume previous diet.                           - Await pathology results.                           -Clinic follow-up will be arranged. Begin process                            for insurance approval for anti-TNF therapy (Humira) Al Gagen L. Myrtie Neither, MD 02/22/2023 3:31:32 PM This report has been signed electronically.

## 2023-02-23 ENCOUNTER — Telehealth: Payer: Self-pay

## 2023-02-23 NOTE — Telephone Encounter (Signed)
  Follow up Call-     02/22/2023    2:25 PM  Call back number  Post procedure Call Back phone  # [phone]  Permission to leave phone message Yes     Patient questions:  Do you have a fever, pain , or abdominal swelling? No. Pain Score  0 *  Have you tolerated food without any problems? Yes.    Have you been able to return to your normal activities? Yes.    Do you have any questions about your discharge instructions: Diet   No. Medications  No. Follow up visit  No.  Do you have questions or concerns about your Care? No.  Actions: * If pain score is 4 or above: No action needed, pain <4.

## 2023-02-24 ENCOUNTER — Telehealth: Payer: Self-pay

## 2023-02-24 DIAGNOSIS — K529 Noninfective gastroenteritis and colitis, unspecified: Secondary | ICD-10-CM

## 2023-02-24 DIAGNOSIS — K603 Anal fistula, unspecified: Secondary | ICD-10-CM

## 2023-02-24 DIAGNOSIS — K91850 Pouchitis: Secondary | ICD-10-CM

## 2023-02-24 MED ORDER — HUMIRA-CD/UC/HS STARTER 80 MG/0.8ML ~~LOC~~ AJKT: AUTO-INJECTOR | SUBCUTANEOUS | Status: DC

## 2023-02-24 MED ORDER — HUMIRA (2 PEN) 40 MG/0.4ML ~~LOC~~ AJKT: 40.0000 mg | AUTO-INJECTOR | SUBCUTANEOUS | Status: DC

## 2023-02-24 NOTE — Telephone Encounter (Signed)
Please begin PA for Humira starter and maintenance dose. Prescriptions are in chart.Thanks

## 2023-02-25 ENCOUNTER — Telehealth: Payer: Self-pay | Admitting: Gastroenterology

## 2023-02-25 DIAGNOSIS — R197 Diarrhea, unspecified: Secondary | ICD-10-CM

## 2023-02-25 LAB — SURGICAL PATHOLOGY

## 2023-02-25 MED ORDER — DIPHENOXYLATE-ATROPINE 2.5-0.025 MG PO TABS: ORAL_TABLET | ORAL | 0 refills | Status: DC

## 2023-02-25 NOTE — Telephone Encounter (Signed)
Patient called and stated that ever since he had his procedure done he has had a leaky bowel and is not able to control it. Patient mis requesting is there is anything that can sent over to his pharmacy to help him with that. Please advise.

## 2023-02-25 NOTE — Telephone Encounter (Signed)
Returned call to patient. Pt reports that he has been having increased leaky bowel since his procedure. Pt has been advised that this is likely related to prep for his procedure and should return to normal after a few days. Pt denies any blood. Pt will continue to monitor. Pt has a prescription for Lomotil but needs a refill. He reached out to PCP for refill, they are unable to complete his request at this time since Dr. Oneta Rack unexpectedly passed away. Refill for Lomotil called into patient's pharmacy.

## 2023-02-26 ENCOUNTER — Other Ambulatory Visit: Payer: Self-pay | Admitting: Nurse Practitioner

## 2023-02-26 DIAGNOSIS — R197 Diarrhea, unspecified: Secondary | ICD-10-CM

## 2023-02-28 ENCOUNTER — Encounter: Payer: Self-pay | Admitting: Gastroenterology

## 2023-03-01 ENCOUNTER — Telehealth: Payer: Self-pay | Admitting: *Deleted

## 2023-03-01 NOTE — Telephone Encounter (Signed)
-----   Message from Ottie Glazier sent at 02/28/2023  5:23 PM EST ----- Regarding: RE: Consultation Hi Dillon [name] -  I am happy to see this gentleman in clinic.  Karen Kitchens - can you please offer Mr. Hyser an appt with me in clinic on 2/19 @ 11:10AM?  Thanks,  Harriett Sine ----- Message ----- From: Sherrilyn Rist, MD Sent: 02/28/2023   5:47 AM EST To: Ottie Glazier, MD Subject: Consultation                                   Harriett Sine,  This man with hypertension and diabetes that I recently picked up in clinic is interested in having a consultation with you.  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  He is understandably surprised and upset about this.  I broached the subject of anti-TNF therapy or Skyrizi for him after his procedure last week, and I think he is feeling somewhat overwhelmed by this.  When he was seen in clinic, our NP Deanna mentioned you to him, and he is interested in getting your opinion on treatment.  Please let me know which you think, thanks again  [facility]

## 2023-03-01 NOTE — Telephone Encounter (Signed)
Spoke with patient. He is still interested in consult with Dr Doy Hutching. He is scheduled for appointment 03/23/23 at 11:10 am.

## 2023-03-02 ENCOUNTER — Other Ambulatory Visit (HOSPITAL_COMMUNITY): Payer: Self-pay

## 2023-03-02 ENCOUNTER — Telehealth: Payer: Self-pay

## 2023-03-02 ENCOUNTER — Ambulatory Visit
Admission: RE | Admit: 2023-03-02 | Discharge: 2023-03-02 | Disposition: A | Discharge status: Home / Self Care | Payer: Medicare PPO | Source: Ambulatory Visit | Attending: Surgery

## 2023-03-02 ENCOUNTER — Ambulatory Visit: Payer: Medicare PPO | Admitting: Internal Medicine

## 2023-03-02 DIAGNOSIS — R1900 Intra-abdominal and pelvic swelling, mass and lump, unspecified site: Secondary | ICD-10-CM

## 2023-03-02 DIAGNOSIS — Z9889 Other specified postprocedural states: Secondary | ICD-10-CM

## 2023-03-02 DIAGNOSIS — R1909 Other intra-abdominal and pelvic swelling, mass and lump: Secondary | ICD-10-CM | POA: Diagnosis not present

## 2023-03-02 MED ORDER — IOPAMIDOL (ISOVUE-300) INJECTION 61%
100.0000 mL | Freq: Once | INTRAVENOUS | Status: AC | PRN
  Administered 2023-03-02: 100 mL via INTRAVENOUS

## 2023-03-02 NOTE — Telephone Encounter (Signed)
Pharmacy Patient Advocate Encounter   Received notification from CoverMyMeds that prior authorization for Humira 40 mg/0.4 ml auto-injector is required/requested.   Insurance verification completed.   The patient is insured through [employer] .   Per test claim: Prior Authorization done on initial dose no additional authorization required. Authorization starting on 02/02/2023 and ending on 02/01/2024.

## 2023-03-02 NOTE — Telephone Encounter (Signed)
Pharmacy Patient Advocate Encounter   Received notification from CoverMyMeds that prior authorization for Humira Pen-CD/UC/HS Starter (CF) 80MG /0.8ML auto-injector kit is required/requested.   Insurance verification completed.   The patient is insured through [employer] .   Per test claim: PA required; PA submitted to above mentioned insurance via CoverMyMeds Key/confirmation #/EOC [facility] Status is pending

## 2023-03-02 NOTE — Telephone Encounter (Signed)
Pharmacy Patient Advocate Encounter  Received notification from [facility] that Prior Authorization for HUMIRA 80MG  has been APPROVED from 1.1.25 to 12.31.25. Ran test claim, Copay is $100. This test claim was processed through [facility]- copay amounts may vary at other pharmacies due to pharmacy/plan contracts, or as the patient moves through the different stages of their insurance plan.   PA #/Case ID/Reference #: [id number]

## 2023-03-02 NOTE — Telephone Encounter (Signed)
Thank you for the update.  We were mainly trying to do a test authorization for the medicine, but we are not yet ready to start it.  This patient has an upcoming appointment with Dr. Doy Hutching at our office for further discussion regarding his condition and the proposed treatment.  Ellwood Dense MD

## 2023-03-02 NOTE — Telephone Encounter (Signed)
PA request has been Submitted. New Encounter created for follow up. For additional info see Pharmacy Prior Auth telephone encounter from 03/02/2023.

## 2023-03-03 NOTE — Telephone Encounter (Signed)
Baker, Monchell R, CPhT     03/02/23 10:44 PM Note Pharmacy Patient Advocate Encounter   Received notification from [facility] that Prior Authorization for HUMIRA 80MG  has been APPROVED from 1.1.25 to 12.31.25. Ran test claim, Copay is $100. This test claim was processed through [facility]- copay amounts may vary at other pharmacies due to pharmacy/plan contracts, or as the patient moves through the different stages of their insurance plan.    PA #/Case ID/Reference #: [id number]

## 2023-03-22 ENCOUNTER — Other Ambulatory Visit: Payer: Self-pay

## 2023-03-22 MED ORDER — MELOXICAM 7.5 MG PO TABS: 7.5000 mg | ORAL_TABLET | ORAL | 0 refills | Status: DC | PRN

## 2023-03-22 NOTE — Progress Notes (Signed)
 [facility] Return Visit   Referring Provider Lucky Cowboy, MD [address] [place],  [place] [address]  Primary Care Provider Lucky Cowboy, MD  Patient Profile: Dillon [name] is a 68 y.o. male who returns to the [facility] for follow-up of the problem(s) noted below.  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 Myrtie Neither.  Dr. Myrtie Neither requested that I see Dillon [name] in the office as a second opinion regarding diagnosis and management of Dillon [name] IBD.   Current GI Meds  Lomotil as needed Vitamin B complex Vitamin D  Interval History  In speaking with Dillon [name] and Dillon [name] wife as well as reviewing Dillon [name] prior medical records, Dillon [name] IBD history is noteworthy for the following:  1990's - Dx'd w/ UC - rectal bleeding, weight loss, anemia in [place] Over 10 years tx'd w/ Asacol, Balsalazide, and multiple doses of prednisone - developed steroid refractory dz 2000 - TAC, rectal mucosectomy, IPAA (Dr. 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; pouchoscopy with inflammatory changes concerning for Crohn's disease  At baseline, Dillon [name] reports having 8-10, loose, watery bowel movements a day in the setting of IPAA Notes episodes of fecal incontinence, nocturnal bowel movements Describes 3-4 episodes of pouchitis for which she was treated with Cipro and Flagyl  for 1 to 2 weeks that resolved Dillon [name] symptoms States that when he takes one half or 1 tablet of ciprofloxacin Dillon [name] bowel frequency immediately decreases to a manageable volume -3 SIBO Takes Lomotil when he is traveling or going out to eat Notes that Dillon [name] bowel frequency has impacted Dillon [name] quality of life with respect to travel and other activities he enjoys  As above incurred first perianal fistula and abscess in 2002 and underwent I&D/cauterization No subsequent perianal issues until 2024 when he developed recurrent abscess with fistula Subsequent pouchoscopy showed inflammatory changes concerning for Crohn's disease CTAP1/2025 did not show inflammation -seton intact  With regard to extraintestinal manifestations, Dillon [name] notes some arthralgias and unclear if this is IBD related No fevers, chills, oral ulcers, skin rashes  Weight, energy and appetite are stable  Given recent findings concerning for Crohn's disease of the J-pouch, Dr. Myrtie Neither discussed the possibility of initiating Humira versus Skyrizi.  Approval has been obtained for Humira but he has not yet started the medication.  Last pouchoscopy: 02/2023 -perianal fistula with seton, ileoanal stricture, scattered inflammatory changes at the ileoanal anastomosis and neoterminal ileum, strictured and ulcerated mucosa 18 to 20 cm proximal to the anus Last endoscopy: None  Last Abd CT/CTE/MRE: CTAP 02/2023 -postop changes consistent with prior total colectomy with J-pouch.  No acute inflammation, abscess or fistula.  Dillon [name] is seen in the right posterior perianal space without evidence of abscess.  GI Review of Symptoms Significant for increased bowel frequency. Otherwise negative.  General Review of Systems  Review of systems is significant for the pertinent positives and negatives as listed per the HPI.  Full ROS is otherwise negative.   Inflammatory Bowel Disease History  1990's - Dx'd w/ UC - rectal bleeding, weight loss, anemia in  [place] Over 10 years tx'd w/ Asacol, Balsalazide, and multiple doses of prednisone - developed steroid refractory dz 2000 - TAC, rectal mucosectomy, IPAA (Dr. 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; pouchoscopy with inflammatory changes concerning for Crohn's disease  IBD Medication History Asacol, balsalazide, prednisone   Past Medical History   Past Medical History:  Diagnosis Date   Anal fissure    Anxiety    DJD (degenerative joint disease)    Elevated hemoglobin A1c    Hyperlipidemia    Hypertension    Ulcerative colitis (HCC)    s/p colectomy     Past Surgical History   Past Surgical History:  Procedure Laterality Date   COLON SURGERY  2000, reversal 2001   coloectomy, with reversal and now a J pouch   ELBOW LIGAMENT RECONSTRUCTION Right July 2014   Dr Orlan Leavens   ELBOW LIGAMENT RECONSTRUCTION Left Sept 2016   Dr Orlan Leavens   ELBOW SURGERY Right 2014   FLEXIBLE SIGMOIDOSCOPY N/A 12/02/2022   Procedure: FLEXIBLE SIGMOIDOSCOPY;  Surgeon: Andria Meuse, MD;  Location: [facility];  Service: General;  Laterality: N/A;   HAND SURGERY Right 1975   HAND SURGERY Right 1975   PLACEMENT OF SETON N/A 12/02/2022   Procedure: PLACEMENT OF SETON;  Surgeon: Andria Meuse, MD;  Location: [facility];  Service: General;  Laterality: N/A;   RECTAL EXAM UNDER ANESTHESIA N/A 12/02/2022   Procedure: RECTAL EXAM UNDER ANESTHESIA;  Surgeon: Andria Meuse, MD;  Location:  SURGERY CENTER;  Service: General;  Laterality: N/A;     Allergies and Medications   Allergies  Allergen Reactions   Codeine Nausea And Vomiting    Norco [Hydrocodone-Acetaminophen] Nausea And Vomiting    Per pt can take tylenol   Penicillins Nausea And Vomiting    Childhood rxn    Current Meds  Medication Sig   acetaminophen (TYLENOL) 500 MG tablet Take 500 mg by mouth as needed.   ALPRAZolam (XANAX) 0.25 MG tablet TAKE 1/2 TO 1 TABLET BY MOUTH 2 TO 3 TIMES PER DAY AS NEEDED FOR ANXIETY   B Complex-C (B-COMPLEX WITH VITAMIN C) tablet Take 1 tablet by mouth daily.   Cholecalciferol (VITAMIN D PO) Take 200 Units by mouth daily.    diphenoxylate-atropine (LOMOTIL) 2.5-0.025 MG tablet TAKE 1-2 TABLET BY MOUTH FOR UP TO FOUR TIMES DAILY AS NEEDED FOR DIARRIHEA MAX 4 TIMES DAILY   ipratropium (ATROVENT) 0.06 % nasal spray Use 1 to 2 sprays each nostril 2 to 3 x /day as needed   meloxicam (MOBIC) 7.5 MG tablet Take 1 tablet (7.5 mg total) by mouth as needed.   Multiple Vitamin (MULTIVITAMIN) tablet Take 1 tablet by mouth daily.   rosuvastatin (CRESTOR) 5 MG tablet TAKE 1 TABLET BY MOUTH 3 TIMES PER WEEK(MONDAY, WEDNESDAY, AND FRIDAY).     Family History   Family History  Problem Relation Age of Onset   Diabetes Mother    Arthritis Mother        Rheumatoid   Heart disease Father    Gout Father    Cancer Father        melanoma  Cancer Sister        Breast   Suicidality Sister    Ulcerative colitis Maternal Grandfather    Liver disease Neg Hx    Colon cancer Neg Hx    Esophageal cancer Neg Hx    Rectal cancer Neg Hx    Stomach cancer Neg Hx     Social History   Social History   Tobacco Use   Smoking status: Never   Smokeless tobacco: Never  Vaping Use   Vaping status: Never Used  Substance Use Topics   Alcohol use: Yes    Comment: occa smixed drink   Drug use: No   Wayne reports that he has never smoked. He has never used smokeless tobacco. He reports current alcohol use. He reports that he does not use drugs.  Vital Signs and Physical Examination   Vitals:   03/23/23 1113  BP: 128/68  Pulse: 72   Body  mass index is 27.18 kg/m. Weight: 168 lb 6 oz (76.4 kg)  General: Well developed, well nourished, no acute distress Head: Normocephalic and atraumatic Eyes: Sclerae anicteric, EOMI Lungs: Clear throughout to auscultation Heart: Regular rate and rhythm; No murmurs, rubs or bruits Abdomen: Soft, non tender and non distended. No masses, hepatosplenomegaly or hernias noted. Normal Bowel sounds Rectal: Deferred Musculoskeletal: Symmetrical with no gross deformities   Review of Data  The following data was reviewed at the time of this encounter:  Laboratory Studies      Latest Ref Rng & Units 08/24/2022    2:58 PM 08/20/2021   11:32 AM 03/11/2021   11:33 AM  CBC  WBC 3.8 - 10.8 Thousand/uL 7.4  7.7  5.2   Hemoglobin 13.2 - 17.1 g/dL 16.1  09.6  04.5   Hematocrit 38.5 - 50.0 % 42.2  46.2  42.5   Platelets 140 - 400 Thousand/uL 280  294  269     No results found for: "LIPASE"    Latest Ref Rng & Units 08/24/2022    2:58 PM 08/20/2021   11:32 AM 03/11/2021   11:33 AM  CMP  Glucose 65 - 99 mg/dL 90  409  97   BUN 7 - 25 mg/dL 11  14  10    Creatinine 0.70 - 1.35 mg/dL 8.11  9.14  7.82   Sodium 135 - 146 mmol/L 140  139  139   Potassium 3.5 - 5.3 mmol/L 3.9  4.0  3.9   Chloride 98 - 110 mmol/L 103  101  104   CO2 20 - 32 mmol/L 28  26  26    Calcium 8.6 - 10.3 mg/dL 9.4  9.9  9.5   Total Protein 6.1 - 8.1 g/dL 7.4  7.5  6.8   Total Bilirubin 0.2 - 1.2 mg/dL 0.6  0.9  0.5   AST 10 - 35 U/L 26  32  22   ALT 9 - 46 U/L 26  28  25      IBD Labs  Prebiologic Labs  03/2023 Hepatitis B surface antigen negative Hepatitis B surface antibody negative Hepatitis B core antibody total negative HCV antibody negative  QuantiFERON gold negative  Therapeutic Drug Monitoring Thiopurine metabolite levels:  Date:                6-TGN       6-MMP  Biologic level and antibodies:  Fecal Calprotectin 01/2023  72  Imaging Studies  CTAP 02/2023 No acute findings.  No evidence of abdominal wall  hernia or mass.  Prior total colectomy with J-pouch. Seton in perianal region, without evidence of abscess.    GI Procedures and Studies  Pouchoscopy 02/2023 Posterior anal fistula and seton Patchy inflammation in the J-pouch and neoterminal ileum Stenosis 3 to 5 cm from the anal verge traversable with an upper endoscope  Clinical Impression  It is my clinical impression that Dillon [name] is a 68 y.o. 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  For 10 years he was treated with mesalamine agents in the form of Asacol, balsalazide and states that he remained on chronic prednisone in varying doses for almost a decade.  After developing steroid refractory disease he underwent stage total abdominal proctocolectomy with ileal pouch anal anastomosis by Dr. Myles Gip at [facility] in 2000.  Records indicate that he has a handsewn anastomosis with rectal mucosectomy.  Initial colectomy was complicated by anastomotic leak requiring drainage.  Dillon [name] indicates that he also incurred an infection with ileostomy takedown.  In 2002, he developed a perianal abscess and fistula for which he underwent I&D and cauterization.  He subsequently denied any perianal issues until more recently in 2024 when he developed recurrent abscess and EUA demonstrated an intersphincteric anal fistula.  Pouchoscopy 02/2023 demonstrated inflammatory changes in the J-pouch and prepouch ileum concerning for Crohn's disease in the setting of recurrent perianal fistula.  Dillon [name] reports that he has had 3-4 episodes of acute pouchitis readily treated with  antibiotics.  At today's visit we reviewed that Dillon [name] history of recurrent perianal fistulas remote from surgery as well as the inflammatory changes seen endoscopically would be indicative of a diagnosis of Crohn's disease of the pouch.  We discussed the spectrum of IBD and that some individuals possess genes overlapping between Crohn's disease and ulcerative colitis underpinning a phenotype with shared features.  With regard to future treatment, I agree with Dr. Myrtie Neither that anti-TNF therapy would be prudent given Dillon [name] fistulizing phenotype.  In the setting of an isolated fistula and Dillon [name] inflammatory changes, a trial of Humira is a reasonable first-line agent.  If he developsimmunogenicity or this were ineffective, transitioning to infliximab may be another option.  Anti-IL 12/23 and Jak inhibitor therapy are also supported in the literature as potential agents if needed down the line.    I discussed with Dillon [name] that some of Dillon [name] bowel frequency impacting Dillon [name] quality of life may improve with anti-TNF treatment.  He could also have a component of SIBO given that he notes significant improvement in symptoms with ciprofloxacin.  If bowel frequency persists after initiating treatment with Humira could consider at cycle courses of ciprofloxacin.  Unclear if Dillon [name] arthralgias are due to degenerative joint disease or if there is a component of inflammatory arthritis.  Discussed that if he notes improvement in Dillon [name] arthralgias with Humira it could support an inflammatory arthritis phenotype.  Discussed that the development of malignancy in J-pouch is rare but has been reported status post TAPC+IPAA.  Records indicate a handsewn anastomosis with rectal mucosectomy.  There are rare reports of polyps and rectal cancer from rectal island tissue despite rectal mucosectomy.  Advised surveillance pouchoscopy at least every 3 years.  Plan  Prebiologic laboratory studies ordered today-hepatitis B  serologies and QuantiFERON  gold -these are negative.  Needs updated hepatitis B vaccination Start Humira induction 160 mg, 80 mg, then 40 mg subcutaneously q. 2 weeks. Recommend therapeutic drug monitoring within 2 to 3 months of initiating Humira with goal trough level 10-15 in the setting of fistulizing disease.  May require weekly dosing in the setting of aggressive phenotype.  May or may not need combination therapy in the future with an immune modulator-depends upon course Monitor disease activity with periodic fecal calprotectin assessments Restaging pouchoscopy 6 to 12 months status post initiation of Humira Monitor routine labs every 4 to 6 months on immune suppression-CBC, CMP, ESR, CRP Recommend annual nutritional profile-iron panel, vitamin B12, folate, vitamin D Continue vitamin D and vitamin B supplementation Continue Lomotil as needed for Dillon [name] bowel frequency If bowel frequency, nocturnal stools and incontinence remain an issue in the future consider cycled courses of ciprofloxacin for possible SIBO Monitor weight and anthropometrics   IBD Health Maintenance  Vaccinations Influenza: PCV13: PPSV23: COVID19: HAV/HBV: Needs updated HBV vaccination Shingles: HPV:  DEXA Suggest baseline DEXA given previous 10-year history of prednisone use  Eye Exam As needed  Skin Exam Recommend annual skin exam after commencing on immune suppression  Surveillance Pouchoscopy Surveillance pouchoscopy at least every 3 years -rule out polyps and malignancy  Tobacco Use None  Depression Screen      Planned Follow Up Dillon [name] will return to Dr. Irving Burton care.  I would be happy to collaborate in Dillon [name] care in the future should Dillon [name] or Dr. Myrtie Neither request.  The patient or caregiver verbalized understanding of the material covered, with no barriers to understanding. All questions were answered. Patient or caregiver is agreeable with the plan outlined above.    It was a pleasure to see Dillon [name].  If you have any  questions or concerns regarding this evaluation, do not hesitate to contact me.  Maren Beach, MD [facility]   I spent total of 45  minutes in both face-to-face and non-face-to-face activities, excluding procedures performed, for the visit on the date of this encounter.

## 2023-03-23 ENCOUNTER — Ambulatory Visit: Payer: Medicare PPO | Admitting: Pediatrics

## 2023-03-23 ENCOUNTER — Encounter: Payer: Self-pay | Admitting: Pediatrics

## 2023-03-23 ENCOUNTER — Other Ambulatory Visit: Payer: Medicare PPO

## 2023-03-23 VITALS — BP 128/68 | HR 72 | Ht 66.0 in | Wt 168.4 lb

## 2023-03-23 DIAGNOSIS — Z8719 Personal history of other diseases of the digestive system: Secondary | ICD-10-CM | POA: Diagnosis not present

## 2023-03-23 DIAGNOSIS — Z79899 Other long term (current) drug therapy: Secondary | ICD-10-CM

## 2023-03-23 DIAGNOSIS — K50813 Crohn's disease of both small and large intestine with fistula: Secondary | ICD-10-CM

## 2023-03-23 DIAGNOSIS — E559 Vitamin D deficiency, unspecified: Secondary | ICD-10-CM

## 2023-03-23 DIAGNOSIS — K50919 Crohn's disease, unspecified, with unspecified complications: Secondary | ICD-10-CM

## 2023-03-23 DIAGNOSIS — M2550 Pain in unspecified joint: Secondary | ICD-10-CM

## 2023-03-23 NOTE — Patient Instructions (Signed)
 Your provider has requested that you go to the basement level for lab work before leaving today. Press "B" on the elevator. The lab is located at the first door on the left as you exit the elevator.  Due to recent changes in healthcare laws, you may see the results of your imaging and laboratory studies on MyChart before your provider has had a chance to review them.  We understand that in some cases there may be results that are confusing or concerning to you. Not all laboratory results come back in the same time frame and the provider may be waiting for multiple results in order to interpret others.  Please give [place] 48 hours in order for your provider to thoroughly review all the results before contacting the office for clarification of your results.   Keep your follow up visit with Dr. Myrtie Neither on 4/29.  _______________________________________________________  If your blood pressure at your visit was 140/90 or greater, please contact your primary care physician to follow up on this.  _______________________________________________________  If you are age 60 or older, your body mass index should be between 23-30. Your Body mass index is 27.18 kg/m. If this is out of the aforementioned range listed, please consider follow up with your Primary Care Provider.  If you are age 67 or younger, your body mass index should be between 19-25. Your Body mass index is 27.18 kg/m. If this is out of the aformentioned range listed, please consider follow up with your Primary Care Provider.   ________________________________________________________  The [facility] providers would like to encourage you to use [facility] to communicate with providers for non-urgent requests or questions.  Due to long hold times on the telephone, sending your provider a message by [facility] may be a faster and more efficient way to get a response.  Please allow 48 business hours for a response.  Please remember that this is for non-urgent  requests.  _______________________________________________________  Thank you for entrusting me with your care and for choosing [facility], Dr. Maren Beach

## 2023-03-24 LAB — HEPATITIS B SURFACE ANTIGEN: Hepatitis B Surface Ag: NONREACTIVE

## 2023-03-24 LAB — HEPATITIS B CORE ANTIBODY, TOTAL: Hep B Core Total Ab: NONREACTIVE

## 2023-03-24 LAB — HEPATITIS C ANTIBODY: Hepatitis C Ab: NONREACTIVE

## 2023-03-24 LAB — HEPATITIS B SURFACE ANTIBODY,QUALITATIVE: Hep B S Ab: NONREACTIVE

## 2023-03-26 ENCOUNTER — Encounter: Payer: Self-pay | Admitting: Pediatrics

## 2023-03-26 LAB — QUANTIFERON-TB GOLD PLUS
Mitogen-NIL: 8.38 [IU]/mL
NIL: 0.03 [IU]/mL
QuantiFERON-TB Gold Plus: NEGATIVE
TB1-NIL: 0.01 [IU]/mL
TB2-NIL: 0.01 [IU]/mL

## 2023-03-28 ENCOUNTER — Telehealth: Payer: Self-pay | Admitting: Gastroenterology

## 2023-03-28 DIAGNOSIS — K603 Anal fistula, unspecified: Secondary | ICD-10-CM

## 2023-03-28 DIAGNOSIS — K529 Noninfective gastroenteritis and colitis, unspecified: Secondary | ICD-10-CM

## 2023-03-28 DIAGNOSIS — K91850 Pouchitis: Secondary | ICD-10-CM

## 2023-03-28 MED ORDER — HUMIRA (2 PEN) 40 MG/0.4ML ~~LOC~~ AJKT: 40.0000 mg | AUTO-INJECTOR | SUBCUTANEOUS | Status: DC

## 2023-03-28 MED ORDER — HUMIRA-CD/UC/HS STARTER 80 MG/0.8ML ~~LOC~~ AJKT: AUTO-INJECTOR | SUBCUTANEOUS | Status: DC

## 2023-03-28 NOTE — Telephone Encounter (Signed)
 Inbound call from patient requesting a call to discuss starting Humira. Please advise, thank you.

## 2023-03-28 NOTE — Telephone Encounter (Signed)
 Thank you for the note.  I also see that the hepatitis B and TB testing ordered by Dr. Doy Hutching were negative. So this patient is ready to begin Humira, and I will see him in clinic as scheduled.  Ellwood Dense MD

## 2023-03-28 NOTE — Telephone Encounter (Signed)
 Patient enrolled in Humira complete, nurse ambassador will contact patient to discuss injection training.  Humira prescriptions have been sent to [facility] specialty pharmacy.  MyChart message sent to patient.

## 2023-04-05 ENCOUNTER — Telehealth: Payer: Self-pay | Admitting: Gastroenterology

## 2023-04-05 ENCOUNTER — Telehealth: Payer: Self-pay

## 2023-04-05 DIAGNOSIS — K529 Noninfective gastroenteritis and colitis, unspecified: Secondary | ICD-10-CM

## 2023-04-05 DIAGNOSIS — K603 Anal fistula, unspecified: Secondary | ICD-10-CM

## 2023-04-05 DIAGNOSIS — K91850 Pouchitis: Secondary | ICD-10-CM

## 2023-04-05 MED ORDER — HUMIRA (2 PEN) 40 MG/0.4ML ~~LOC~~ AJKT: 40.0000 mg | AUTO-INJECTOR | SUBCUTANEOUS | Status: DC

## 2023-04-05 MED ORDER — HUMIRA-CD/UC/HS STARTER 80 MG/0.8ML ~~LOC~~ AJKT: AUTO-INJECTOR | SUBCUTANEOUS | 0 refills | Status: DC

## 2023-04-05 MED ORDER — HUMIRA-CD/UC/HS STARTER 80 MG/0.8ML ~~LOC~~ AJKT
AUTO-INJECTOR | SUBCUTANEOUS | Status: DC
Start: 2023-04-05 — End: 2023-04-05

## 2023-04-05 MED ORDER — HUMIRA (2 PEN) 40 MG/0.4ML ~~LOC~~ AJKT: 40.0000 mg | AUTO-INJECTOR | SUBCUTANEOUS | 11 refills | Status: AC

## 2023-04-05 NOTE — Telephone Encounter (Signed)
 Patient called and stated that [facility] did received page 5 that was faxed over but the pharmacy stated they had no prescription for him regarding his humira. Patient is requesting a call back. Please advise.

## 2023-04-05 NOTE — Telephone Encounter (Signed)
 Received a fax from [facility] requesting completion of page 5 (Enrollment and Prescription form). Page 5 was completed as requested with provider information (Dr. Amada Jupiter), office contact, and prescriptions for Humira - including starter and maintenance dose.   Page 5 has been faxed back to [facility] along with patient's medication allergies and concomitant medications.  [employer] P: [phone] F: [phone]

## 2023-04-05 NOTE — Telephone Encounter (Signed)
 Called and spoke with patient. Pt states that [facility] pharmacy has not received Humira prescription. I have resent prescriptions today to [facility] pharmacy, I asked that patient follow up with [facility] pharmacy later this week. If patient has any issues he will let us know.

## 2023-05-05 ENCOUNTER — Encounter: Payer: Self-pay | Admitting: Nurse Practitioner

## 2023-05-05 ENCOUNTER — Ambulatory Visit: Payer: Medicare PPO | Admitting: Nurse Practitioner

## 2023-05-05 VITALS — BP 130/80 | HR 76 | Temp 97.1°F | Ht 66.0 in | Wt 168.6 lb

## 2023-05-05 DIAGNOSIS — E559 Vitamin D deficiency, unspecified: Secondary | ICD-10-CM

## 2023-05-05 DIAGNOSIS — F411 Generalized anxiety disorder: Secondary | ICD-10-CM | POA: Diagnosis not present

## 2023-05-05 DIAGNOSIS — E782 Mixed hyperlipidemia: Secondary | ICD-10-CM

## 2023-05-05 DIAGNOSIS — K5190 Ulcerative colitis, unspecified, without complications: Secondary | ICD-10-CM

## 2023-05-05 DIAGNOSIS — F419 Anxiety disorder, unspecified: Secondary | ICD-10-CM | POA: Diagnosis not present

## 2023-05-05 MED ORDER — MELOXICAM 7.5 MG PO TABS
7.5000 mg | ORAL_TABLET | ORAL | 1 refills | Status: AC | PRN
Start: 2023-05-05 — End: ?

## 2023-05-05 MED ORDER — ALPRAZOLAM 0.25 MG PO TABS: ORAL_TABLET | ORAL | 0 refills | Status: DC

## 2023-05-05 MED ORDER — FLUOXETINE HCL 10 MG PO CAPS: 10.0000 mg | ORAL_CAPSULE | Freq: Every day | ORAL | 1 refills | Status: DC

## 2023-05-05 NOTE — Progress Notes (Unsigned)
 New Patient Visit  BP 130/80 (BP Location: Left Arm, Patient Position: Sitting, Cuff Size: Normal)   Pulse 76   Temp (!) 97.1 F (36.2 C)   Ht 5\' 6"  (1.676 m)   Wt 168 lb 9.6 oz (76.5 kg)   SpO2 98%   BMI 27.21 kg/m    Subjective:    Patient ID: Dillon [name], male    DOB: [birth year], 68 y.o.   MRN: [id number]  CC: Chief Complaint  Patient presents with   Establish Care    NP. Est. Care, no concerns    HPI: Dillon [name] is a 68 y.o. male presents for new patient visit to establish care.  Introduced to Publishing rights manager role and practice setting.  All questions answered.  Discussed provider/patient relationship and expectations.  Discussed the use of AI scribe software for clinical note transcription with the patient, who gave verbal consent to proceed.  History of Present Illness   Dillon [name], a retired individual with a history of high cholesterol, high blood pressure, cataracts, depression, anxiety, arthritis, and ulcerative colitis, presents today for a routine check-up. He was recently diagnosed with Crohn's disease and has started treatment with Humira. He reports some abdominal discomfort, which he attributes to his recent diagnosis and treatment. He also mentions that he experiences anxiety, for which he takes Xanax as needed. He has tried zoloft, lexapro, cymbalta, and effexor in the past, however had side effects when starting them. He states that his anxiety has gotten a little worse recently. He denies any chest pain, shortness of breath, frequent urination, headaches, dizziness, or skin issues. He reports no issues with diarrhea, but mentions that his abdomen is tender. He denies any swelling in his legs.        05/05/2023    1:45 PM 08/24/2022    1:32 AM 08/19/2021   11:01 PM 10/22/2020    3:53 PM 04/30/2020   11:12 PM  Depression screen PHQ 2/9  Decreased Interest 3 0 0 2 0  Down, Depressed, Hopeless 1 0 0 3 0  PHQ - 2 Score 4 0 0 5 0  Altered sleeping 2   1    Tired, decreased energy 1   3   Change in appetite 0   0   Feeling bad or failure about yourself  0   1   Trouble concentrating 1   0   Moving slowly or fidgety/restless 0   0   Suicidal thoughts 0   0   PHQ-9 Score 8   10   Difficult doing work/chores Not difficult at all   Somewhat difficult       05/05/2023    1:46 PM 10/22/2020    3:56 PM  GAD 7 : Generalized Anxiety Score  Nervous, Anxious, on Edge 2 3  Control/stop worrying 2 3  Worry too much - different things 2 3  Trouble relaxing 0 1  Restless 0 0  Easily annoyed or irritable 0 3  Afraid - awful might happen 2 3  Total GAD 7 Score 8 16  Anxiety Difficulty Not difficult at all Somewhat difficult     Past Medical History:  Diagnosis Date   Allergy 2000   Sometimes   Anal fissure    Anxiety Years   Cataract 3/25   Depression 2001   Occasionally   DJD (degenerative joint disease)    Elevated hemoglobin A1c    Hyperlipidemia    Hypertension    Ulcerative colitis (HCC)  s/p colectomy    Past Surgical History:  Procedure Laterality Date   COLON SURGERY  2000, reversal 2001   coloectomy, with reversal and now a J pouch   ELBOW LIGAMENT RECONSTRUCTION Right 08/01/2012   Dr Orlan Leavens   ELBOW LIGAMENT RECONSTRUCTION Left 10/03/2014   Dr Orlan Leavens   ELBOW SURGERY Right 02/02/2012   FLEXIBLE SIGMOIDOSCOPY N/A 12/02/2022   Procedure: FLEXIBLE SIGMOIDOSCOPY;  Surgeon: Andria Meuse, MD;  Location: [facility];  Service: General;  Laterality: N/A;   HAND SURGERY Right 02/01/1973   HAND SURGERY Right 02/01/1973   PLACEMENT OF SETON N/A 12/02/2022   Procedure: PLACEMENT OF SETON;  Surgeon: Andria Meuse, MD;  Location: [facility];  Service: General;  Laterality: N/A;   RECTAL EXAM UNDER ANESTHESIA N/A 12/02/2022   Procedure: RECTAL EXAM UNDER ANESTHESIA;  Surgeon: Andria Meuse, MD;  Location: [facility];  Service: General;  Laterality: N/A;   SMALL  INTESTINE SURGERY  2000    Family History  Problem Relation Age of Onset   Diabetes Mother    Arthritis Mother        Rheumatoid   Heart disease Father    Gout Father    Cancer Father        melanoma   Cancer Sister        Breast   Suicidality Sister    Ulcerative colitis Maternal Grandfather    Liver disease Neg Hx    Colon cancer Neg Hx    Esophageal cancer Neg Hx    Rectal cancer Neg Hx    Stomach cancer Neg Hx      Social History   Tobacco Use   Smoking status: Never   Smokeless tobacco: Never  Vaping Use   Vaping status: Never Used  Substance Use Topics   Alcohol use: Yes    Alcohol/week: 10.0 standard drinks of alcohol    Types: 10 Standard drinks or equivalent per week    Comment: occa smixed drink   Drug use: No    Current Outpatient Medications on File Prior to Visit  Medication Sig Dispense Refill   acetaminophen (TYLENOL) 500 MG tablet Take 500 mg by mouth as needed.     adalimumab (HUMIRA, 2 PEN,) 40 MG/0.4ML pen Inject 0.4 mLs (40 mg total) into the skin every 14 (fourteen) days. 2 each 11   adalimumab (HUMIRA-CD/UC/HS STARTER) 80 MG/0.8ML pen Inject 2 pens (160 mg total) under the skin on day 1, inject 1 pen (80 mg total) under the skin on day 15 3 each 0   B Complex-C (B-COMPLEX WITH VITAMIN C) tablet Take 1 tablet by mouth daily.     Cholecalciferol (VITAMIN D PO) Take 200 Units by mouth daily.      diphenoxylate-atropine (LOMOTIL) 2.5-0.025 MG tablet TAKE 1-2 TABLET BY MOUTH FOR UP TO FOUR TIMES DAILY AS NEEDED FOR DIARRIHEA MAX 4 TIMES DAILY 30 tablet 0   ipratropium (ATROVENT) 0.06 % nasal spray Use 1 to 2 sprays each nostril 2 to 3 x /day as needed 45 mL 3   rosuvastatin (CRESTOR) 5 MG tablet TAKE 1 TABLET BY MOUTH 3 TIMES PER WEEK(MONDAY, WEDNESDAY, AND FRIDAY). 39 tablet 3   No current facility-administered medications on file prior to visit.     Review of Systems  Constitutional: Negative.   HENT: Negative.    Eyes: Negative.    Respiratory: Negative.    Cardiovascular: Negative.   Gastrointestinal:  Positive for abdominal pain. Negative  for constipation, diarrhea, nausea and vomiting.  Endocrine: Negative.   Genitourinary: Negative.   Musculoskeletal: Negative.   Skin: Negative.   Neurological: Negative.   Psychiatric/Behavioral:  Negative for dysphoric mood. The patient is nervous/anxious.       Objective:    BP 130/80 (BP Location: Left Arm, Patient Position: Sitting, Cuff Size: Normal)   Pulse 76   Temp (!) 97.1 F (36.2 C)   Ht 5\' 6"  (1.676 m)   Wt 168 lb 9.6 oz (76.5 kg)   SpO2 98%   BMI 27.21 kg/m   Wt Readings from Last 3 Encounters:  05/05/23 168 lb 9.6 oz (76.5 kg)  03/23/23 168 lb 6 oz (76.4 kg)  02/22/23 167 lb (75.8 kg)    BP Readings from Last 3 Encounters:  05/05/23 130/80  03/23/23 128/68  02/22/23 109/66    Physical Exam Vitals and nursing note reviewed.  Constitutional:      General: He is not in acute distress.    Appearance: Normal appearance.  HENT:     Head: Normocephalic and atraumatic.     Right Ear: Tympanic membrane, ear canal and external ear normal.     Left Ear: Tympanic membrane, ear canal and external ear normal.  Eyes:     Conjunctiva/sclera: Conjunctivae normal.  Cardiovascular:     Rate and Rhythm: Normal rate and regular rhythm.     Pulses: Normal pulses.     Heart sounds: Normal heart sounds.  Pulmonary:     Effort: Pulmonary effort is normal.     Breath sounds: Normal breath sounds.  Abdominal:     General: There is no distension.     Palpations: Abdomen is soft. There is no mass.     Tenderness: There is abdominal tenderness (mid abdomen). There is no guarding.     Comments: Diastasis recti on exam  Musculoskeletal:        General: Normal range of motion.     Cervical back: Normal range of motion and neck supple. No tenderness.     Right lower leg: No edema.     Left lower leg: No edema.  Lymphadenopathy:     Cervical: No cervical  adenopathy.  Skin:    General: Skin is warm and dry.  Neurological:     General: No focal deficit present.     Mental Status: He is alert and oriented to person, place, and time.     Cranial Nerves: No cranial nerve deficit.     Coordination: Coordination normal.     Gait: Gait normal.  Psychiatric:        Mood and Affect: Mood normal.        Behavior: Behavior normal.        Thought Content: Thought content normal.        Judgment: Judgment normal.       Assessment & Plan:   Problem List Items Addressed This Visit       Digestive   Ulcerative colitis (HCC) - Primary   He had surgery for ulcerative colitis years ago and was recently diagnosed with Crohn's disease. He is managed with Humira, with the second dose scheduled. Seton removal is planned 6-8 weeks after starting Humira. He experiences abdominal tenderness. A follow-up with the gastroenterologist is scheduled. Continue collaboration and recommendations from GI and surgery.        Other   Mixed hyperlipidemia   His cholesterol is well-controlled with rosuvastatin. Continue rosuvastatin 5mg  three times a week.  Vitamin D deficiency   Continue vitamin D supplement OTC daily.      Generalized anxiety disorder   Chronic, not controlled. He has chronic anxiety with multiple medication trials including zoloft, lexapro, celexa, duloxetine, and effexor. Xanax is used occasionally. Start Prozac 10 mg daily after returning from the trip. Continue Xanax 0.25mg  daily as needed. PDMP reviewed. Refill sent to pharmacy. Follow up in 4-6 weeks to assess response to Prozac.      Relevant Medications   FLUoxetine (PROZAC) 10 MG capsule   ALPRAZolam (XANAX) 0.25 MG tablet   Other Visit Diagnoses       Anxiety       Relevant Medications   FLUoxetine (PROZAC) 10 MG capsule   ALPRAZolam (XANAX) 0.25 MG tablet         Follow up plan: Return in about 4 weeks (around 06/02/2023) for Anxiety.  Niccolas Loeper A Aleksis Jiggetts

## 2023-05-05 NOTE — Patient Instructions (Signed)
 It was great to see you!  Start prozac 1 capsule daily for anxiety  I have refilled your meloxicam  Keep taking the rosuvastatin 3 times a week.   Let's follow-up in 4-6 weeks, sooner if you have concerns.  If a referral was placed today, you will be contacted for an appointment. Please note that routine referrals can sometimes take up to 3-4 weeks to process. Please call our office if you haven't heard anything after this time frame.  Take care,  Rodman Pickle, NP

## 2023-05-06 NOTE — Assessment & Plan Note (Signed)
 He had surgery for ulcerative colitis years ago and was recently diagnosed with Crohn's disease. He is managed with Humira, with the second dose scheduled. Seton removal is planned 6-8 weeks after starting Humira. He experiences abdominal tenderness. A follow-up with the gastroenterologist is scheduled. Continue collaboration and recommendations from GI and surgery.

## 2023-05-06 NOTE — Assessment & Plan Note (Signed)
 Chronic, not controlled. He has chronic anxiety with multiple medication trials including zoloft, lexapro, celexa, duloxetine, and effexor. Xanax is used occasionally. Start Prozac 10 mg daily after returning from the trip. Continue Xanax 0.25mg  daily as needed. PDMP reviewed. Refill sent to pharmacy. Follow up in 4-6 weeks to assess response to Prozac.

## 2023-05-06 NOTE — Assessment & Plan Note (Signed)
 His cholesterol is well-controlled with rosuvastatin. Continue rosuvastatin 5mg  three times a week.

## 2023-05-06 NOTE — Assessment & Plan Note (Signed)
 Continue vitamin D supplement OTC daily.

## 2023-05-31 ENCOUNTER — Ambulatory Visit: Payer: Medicare PPO | Admitting: Gastroenterology

## 2023-05-31 ENCOUNTER — Encounter: Payer: Self-pay | Admitting: Gastroenterology

## 2023-05-31 VITALS — BP 138/64 | HR 83 | Ht 66.5 in | Wt 165.0 lb

## 2023-05-31 DIAGNOSIS — K91850 Pouchitis: Secondary | ICD-10-CM | POA: Diagnosis not present

## 2023-05-31 DIAGNOSIS — K529 Noninfective gastroenteritis and colitis, unspecified: Secondary | ICD-10-CM

## 2023-05-31 DIAGNOSIS — Z7960 Long term (current) use of unspecified immunomodulators and immunosuppressants: Secondary | ICD-10-CM | POA: Diagnosis not present

## 2023-05-31 DIAGNOSIS — Z23 Encounter for immunization: Secondary | ICD-10-CM | POA: Diagnosis not present

## 2023-05-31 DIAGNOSIS — R197 Diarrhea, unspecified: Secondary | ICD-10-CM

## 2023-05-31 NOTE — Progress Notes (Signed)
 [facility] Progress Note  Chief Complaint: Crohn's disease/pouchitis  Subjective  Prior history Clinical details in my initial office consult note. 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 Developed fistulizing disease requiring seton placement with Dr. Camilo Cella at [facility], who then referred him to our office. Pouchoscopy 02/22/2023 with endoscopic findings suggesting Crohn's disease. Dillon [name] saw Dr. Eugenia Hess at our office for IBD treatment opinion, and she concurred with institution of anti-TNF therapy (with possible need for Skyrizi or Jak inhibitor in the future, depending on response to anti-TNF therapy).  Of note, she also raise the possibility of SIBO should the patient have persistent diarrhea despite otherwise symptomatic and clinical improvement with Humira.  Still recommendations: Prebiologic laboratory studies ordered today-hepatitis B serologies and QuantiFERON gold -these are negative.  Needs updated hepatitis B vaccination Start Humira induction 160 mg, 80 mg, then 40 mg subcutaneously q. 2 weeks. Recommend therapeutic drug monitoring within 2 to 3 months of initiating Humira with goal trough level 10-15 in the setting of fistulizing disease.  May require weekly dosing in the setting of aggressive phenotype.  May or may not need combination therapy in the future with an immune modulator-depends upon course Monitor disease activity with periodic fecal calprotectin assessments Restaging pouchoscopy 6 to 12 months status post initiation of Humira Monitor routine labs every 4 to 6 months on immune suppression-CBC, CMP, ESR, CRP Recommend annual nutritional profile-iron panel, vitamin B12, folate, vitamin D  Continue vitamin D  and vitamin B supplementation Continue Lomotil  as needed for his bowel frequency If bowel frequency, nocturnal stools and incontinence remain an issue  in the future consider cycled courses of ciprofloxacin  for possible SIBO Monitor weight and anthropometrics  Started Humira on unclear date, but probably late February or early March 2025 based on his recollection.  Reported considerable clinical improvement at that visit with Dr. Beatris Lincoln at [facility] on 05/25/23, including that one of his fistula had improved and off that a seton fell out.   Discussed the use of AI scribe software for clinical note transcription with the patient, who gave verbal consent to proceed.  History of Present Illness  Dillon [name] says he is significantly improved since he started the Humira.  His bowel habits have decreased in frequency by about 50% and there is now some form to it.  He has less urgency and concerned about that, still takes as needed Imodium or Lomotil . He has noticed significant fatigue lately and wonders if it could be due to the medicines.  He is not having any rectal bleeding, denies abdominal pain appetite is reportedly good.  He confirms that the seton fell out and that Dr. Camilo Cella was pleased with how the perianal looked on office exam last week.  I offered to evaluate that area today but he did not feel it was necessary. I am not sure exactly when he started the Humira, there are still some phone notes from early March suggesting issues with the pharmacy. When I reviewed the induction dosing it sounds like he took only 2 shots (80 mg) instead of 4 with the first treatment, then 2 shots again 2 weeks afterward, then 1 shot as prescribed 2 weeks after that, and he believes his upcoming injection due week from today will be his 5th or 6th.   ROS: Cardiovascular:  no chest pain Respiratory: no dyspnea Fatigue Remainder systems negative except as above The patient's Past Medical, Family  and Social History were reviewed and are on file in the EMR. Past Medical History:  Diagnosis Date   Allergy 2000   Sometimes   Anal fissure    Anxiety Years    Cataract 3/25   Depression 2001   Occasionally   DJD (degenerative joint disease)    Elevated hemoglobin A1c    Hyperlipidemia    Hypertension    Pouchitis (HCC)    Ulcerative colitis (HCC)    s/p colectomy    Past Surgical History:  Procedure Laterality Date   COLON SURGERY  2000, reversal 2001   coloectomy, with reversal and now a J pouch   ELBOW LIGAMENT RECONSTRUCTION Right 08/01/2012   Dr Primus Brookes   ELBOW LIGAMENT RECONSTRUCTION Left 10/03/2014   Dr Primus Brookes   ELBOW SURGERY Right 02/02/2012   FLEXIBLE SIGMOIDOSCOPY N/A 12/02/2022   Procedure: FLEXIBLE SIGMOIDOSCOPY;  Surgeon: Melvenia Stabs, MD;  Location: [facility];  Service: General;  Laterality: N/A;   HAND SURGERY Right 02/01/1973   HAND SURGERY Right 02/01/1973   PLACEMENT OF SETON N/A 12/02/2022   Procedure: PLACEMENT OF SETON;  Surgeon: Melvenia Stabs, MD;  Location: [facility];  Service: General;  Laterality: N/A;   POUCHOSCOPY     RECTAL EXAM UNDER ANESTHESIA N/A 12/02/2022   Procedure: RECTAL EXAM UNDER ANESTHESIA;  Surgeon: Melvenia Stabs, MD;  Location: [facility];  Service: General;  Laterality: N/A;   SMALL INTESTINE SURGERY  2000     Objective:  Med list reviewed  Current Outpatient Medications:    acetaminophen  (TYLENOL ) 500 MG tablet, Take 500 mg by mouth as needed., Disp: , Rfl:    adalimumab (HUMIRA, 2 PEN,) 40 MG/0.4ML pen, Inject 0.4 mLs (40 mg total) into the skin every 14 (fourteen) days., Disp: 2 each, Rfl: 11   ALPRAZolam  (XANAX ) 0.25 MG tablet, TAKE 1/2 TO 1 TABLET BY MOUTH 2 TO 3 TIMES PER DAY AS NEEDED FOR ANXIETY, Disp: 60 tablet, Rfl: 0   B Complex-C (B-COMPLEX WITH VITAMIN C) tablet, Take 1 tablet by mouth daily., Disp: , Rfl:    Cholecalciferol (VITAMIN D  PO), Take 200 Units by mouth daily. , Disp: , Rfl:    diphenoxylate -atropine  (LOMOTIL ) 2.5-0.025 MG tablet, TAKE 1-2 TABLET BY MOUTH FOR UP TO FOUR TIMES DAILY AS NEEDED  FOR DIARRIHEA MAX 4 TIMES DAILY, Disp: 30 tablet, Rfl: 0   ipratropium (ATROVENT ) 0.06 % nasal spray, Use 1 to 2 sprays each nostril 2 to 3 x /day as needed, Disp: 45 mL, Rfl: 3   meloxicam  (MOBIC ) 7.5 MG tablet, Take 1 tablet (7.5 mg total) by mouth as needed., Disp: 90 tablet, Rfl: 1   rosuvastatin  (CRESTOR ) 5 MG tablet, TAKE 1 TABLET BY MOUTH 3 TIMES PER WEEK(MONDAY, WEDNESDAY, AND FRIDAY)., Disp: 39 tablet, Rfl: 3   Vital signs in last 24 hrs: Vitals:   05/31/23 1547  BP: 138/64  Pulse: 83   Wt Readings from Last 3 Encounters:  05/31/23 165 lb (74.8 kg)  05/05/23 168 lb 9.6 oz (76.5 kg)  03/23/23 168 lb 6 oz (76.4 kg)    Physical Exam  Well-appearing.  Looks more comfortable than when we had last seen him in the office prior to pouchoscopy, at which time he was uncomfortable sitting down. HEENT: sclera anicteric, oral mucosa moist without lesions Neck: supple, no thyromegaly, JVD or lymphadenopathy Cardiac: Regular appreciable murmur,  no peripheral edema Pulm: clear to auscultation bilaterally, normal RR and effort noted Abdomen: soft,  no tenderness, with active bowel sounds. No guarding or palpable hepatosplenomegaly. Skin; warm and dry, no jaundice or rash   Labs:     Latest Ref Rng & Units 08/24/2022    2:58 PM 08/20/2021   11:32 AM 03/11/2021   11:33 AM  CBC  WBC 3.8 - 10.8 Thousand/uL 7.4  7.7  5.2   Hemoglobin 13.2 - 17.1 g/dL 14.7  82.9  56.2   Hematocrit 38.5 - 50.0 % 42.2  46.2  42.5   Platelets 140 - 400 Thousand/uL 280  294  269     Hep B Sag neg Hep B Sab neg, Core Ab neg HCV Ab neg ___________________________________________ Radiologic studies:   ____________________________________________ Other:   _____________________________________________   Encounter Diagnoses  Name Primary?   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  It sounds like there may have been some miscommunication regarding the induction dosing noted above, but he is into maintenance dosing at this point.  He is not immune to hepatitis B and needs vaccination, which is something he was agreeable to today.  He is aware of Humira risks including immunosuppression with risk for infection as well as malignancies including not limited to small bowel lymphoma.  Plan: Got his first of 2 Heplisav B vaccination doses today  He is due for Humira injection a week from today.  We have placed lab orders so that with the injection 2 weeks after that, he can get a trough Humira level drawn the day before or day of injection (as long as he takes the injection after the blood work).  Since he is feeling fatigued we will also get a CBC, iron studies and B12/folate at that time. He is establishing care with a new primary care provider next week.  Dr. Jarold Merlin suggestion about possible SIBO is noted and helpful.  Will give the Humira some more time and see what the drug levels look like, but there may be a role for periodic dosing with metronidazole  that seems to help him at times.  Clinic follow-up with me in 3 months or sooner if needed  40 minutes were spent on this encounter (including chart review, history/exam, counseling/coordination of care, and documentation) > 50% of that time was spent on counseling and coordination of care.   Kerby Pearson III

## 2023-05-31 NOTE — Patient Instructions (Addendum)
 Your provider has requested that you go to the basement level for lab work in 3 weeks ( around 06/21/23 ). Press "B" on the elevator. The lab is located at the first door on the left as you exit the elevator.  You have been given your first Hep B vaccine today. Your next vaccine will be on : 06/30/23 at 10:00 am   Follow-up with Dr.Danis on : 08/17/23 at 2:00 pm   Due to recent changes in healthcare laws, you may see the results of your imaging and laboratory studies on MyChart before your provider has had a chance to review them.  We understand that in some cases there may be results that are confusing or concerning to you. Not all laboratory results come back in the same time frame and the provider may be waiting for multiple results in order to interpret others.  Please give us  48 hours in order for your provider to thoroughly review all the results before contacting the office for clarification of your results.   Thank you for choosing me and [facility].  Dr.Henry Dominic Friendly

## 2023-06-02 ENCOUNTER — Ambulatory Visit: Payer: Medicare PPO | Admitting: Nurse Practitioner

## 2023-06-06 ENCOUNTER — Encounter: Payer: Self-pay | Admitting: Nurse Practitioner

## 2023-06-06 ENCOUNTER — Ambulatory Visit (INDEPENDENT_AMBULATORY_CARE_PROVIDER_SITE_OTHER): Admitting: Nurse Practitioner

## 2023-06-06 ENCOUNTER — Other Ambulatory Visit (INDEPENDENT_AMBULATORY_CARE_PROVIDER_SITE_OTHER)

## 2023-06-06 VITALS — BP 128/68 | HR 75 | Temp 97.0°F | Ht 66.6 in | Wt 167.0 lb

## 2023-06-06 DIAGNOSIS — R197 Diarrhea, unspecified: Secondary | ICD-10-CM | POA: Diagnosis not present

## 2023-06-06 DIAGNOSIS — K529 Noninfective gastroenteritis and colitis, unspecified: Secondary | ICD-10-CM

## 2023-06-06 DIAGNOSIS — F419 Anxiety disorder, unspecified: Secondary | ICD-10-CM

## 2023-06-06 DIAGNOSIS — F411 Generalized anxiety disorder: Secondary | ICD-10-CM

## 2023-06-06 DIAGNOSIS — K91850 Pouchitis: Secondary | ICD-10-CM | POA: Diagnosis not present

## 2023-06-06 DIAGNOSIS — Z7960 Long term (current) use of unspecified immunomodulators and immunosuppressants: Secondary | ICD-10-CM | POA: Diagnosis not present

## 2023-06-06 LAB — CBC WITH DIFFERENTIAL/PLATELET
Basophils Absolute: 0.2 10*3/uL — ABNORMAL HIGH (ref 0.0–0.1)
Basophils Relative: 2.6 % (ref 0.0–3.0)
Eosinophils Absolute: 0.2 10*3/uL (ref 0.0–0.7)
Eosinophils Relative: 2.3 % (ref 0.0–5.0)
HCT: 42.5 % (ref 39.0–52.0)
Hemoglobin: 14.3 g/dL (ref 13.0–17.0)
Lymphocytes Relative: 46.3 % — ABNORMAL HIGH (ref 12.0–46.0)
Lymphs Abs: 3.6 10*3/uL (ref 0.7–4.0)
MCHC: 33.6 g/dL (ref 30.0–36.0)
MCV: 92.6 fl (ref 78.0–100.0)
Monocytes Absolute: 0.6 10*3/uL (ref 0.1–1.0)
Monocytes Relative: 7.4 % (ref 3.0–12.0)
Neutro Abs: 3.3 10*3/uL (ref 1.4–7.7)
Neutrophils Relative %: 41.4 % — ABNORMAL LOW (ref 43.0–77.0)
Platelets: 359 10*3/uL (ref 150.0–400.0)
RBC: 4.59 Mil/uL (ref 4.22–5.81)
RDW: 14.4 % (ref 11.5–15.5)
WBC: 7.9 10*3/uL (ref 4.0–10.5)

## 2023-06-06 LAB — IBC + FERRITIN
Ferritin: 69.5 ng/mL (ref 22.0–322.0)
Iron: 131 ug/dL (ref 42–165)
Saturation Ratios: 28.4 % (ref 20.0–50.0)
TIBC: 460.6 ug/dL — ABNORMAL HIGH (ref 250.0–450.0)
Transferrin: 329 mg/dL (ref 212.0–360.0)

## 2023-06-06 LAB — FOLATE: Folate: 11.8 ng/mL (ref >5.9)

## 2023-06-06 LAB — VITAMIN B12: Vitamin B-12: 270 pg/mL (ref 211–911)

## 2023-06-06 MED ORDER — ALPRAZOLAM 0.25 MG PO TABS: ORAL_TABLET | ORAL | 0 refills | Status: AC

## 2023-06-06 NOTE — Patient Instructions (Addendum)
 It was great to see you!  You can call your insurance and see if they cover [facility] testing or call [facility] [phone]  I have refilled you xanax   You can try an ashwaghanda supplement daily for anxiety   You can get your tetanus shot updated at any pharmacy   Let's follow-up in 3 months, sooner if you have concerns.  If a referral was placed today, you will be contacted for an appointment. Please note that routine referrals can sometimes take up to 3-4 weeks to process. Please call our office if you haven't heard anything after this time frame.  Take care,  Rheba Cedar, NP

## 2023-06-06 NOTE — Progress Notes (Signed)
 Established Patient Office Visit  Subjective   Patient ID: Dillon [name], male    DOB: 1955-10-20  Age: 68 y.o. MRN: [id number]  Chief Complaint  Patient presents with   Anxiety    Follow up, stopped medication    HPI Discussed the use of AI scribe software for clinical note transcription with the patient, who gave verbal consent to proceed.  History of Present Illness   [name] "Dillon [name]" is a 68 year old male who presents with difficulty managing anxiety symptoms.  He experiences ongoing anxiety characterized by episodes of waking at 1:20 AM with anxiety and racing thoughts, often triggered by stressors such as upcoming travel and a particular difficulty with flying. He has tried various medications for anxiety but discontinued them due to adverse effects like increased depression and head pressure. He currently uses Xanax  as needed, taking half to one tablet two to three times a day, but has only used two pills in the last month, indicating infrequent use.  He experiences trouble sleeping, which he attributes to his anxiety. He does not use any sleep aids such as melatonin. No chest pain or shortness of breath is present.        06/06/2023    8:24 AM 05/05/2023    1:45 PM 08/24/2022    1:32 AM 08/19/2021   11:01 PM 10/22/2020    3:53 PM  Depression screen PHQ 2/9  Decreased Interest 2 3 0 0 2  Down, Depressed, Hopeless 1 1 0 0 3  PHQ - 2 Score 3 4 0 0 5  Altered sleeping 3 2   1   Tired, decreased energy 2 1   3   Change in appetite 0 0   0  Feeling bad or failure about yourself  0 0   1  Trouble concentrating 0 1   0  Moving slowly or fidgety/restless 0 0   0  Suicidal thoughts 0 0   0  PHQ-9 Score 8 8   10   Difficult doing work/chores Somewhat difficult Not difficult at all   Somewhat difficult      06/06/2023    8:24 AM 05/05/2023    1:46 PM 10/22/2020    3:56 PM  GAD 7 : Generalized Anxiety Score  Nervous, Anxious, on Edge 2 2 3   Control/stop worrying 1 2 3    Worry too much - different things 2 2 3   Trouble relaxing 1 0 1  Restless 0 0 0  Easily annoyed or irritable 0 0 3  Afraid - awful might happen 2 2 3   Total GAD 7 Score 8 8 16   Anxiety Difficulty Somewhat difficult Not difficult at all Somewhat difficult     ROS See pertinent positives and negatives per HPI.    Objective:     BP 128/68 (BP Location: Left Arm, Patient Position: Sitting, Cuff Size: Normal)   Pulse 75   Temp (!) 97 F (36.1 C)   Ht 5' 6.6" (1.692 m)   Wt 167 lb (75.8 kg)   SpO2 98%   BMI 26.47 kg/m    Physical Exam Vitals and nursing note reviewed.  Constitutional:      Appearance: Normal appearance.  HENT:     Head: Normocephalic.  Eyes:     Conjunctiva/sclera: Conjunctivae normal.  Cardiovascular:     Rate and Rhythm: Normal rate and regular rhythm.     Pulses: Normal pulses.     Heart sounds: Normal heart sounds.  Pulmonary:  Effort: Pulmonary effort is normal.     Breath sounds: Normal breath sounds.  Musculoskeletal:     Cervical back: Normal range of motion.  Skin:    General: Skin is warm.  Neurological:     General: No focal deficit present.     Mental Status: He is alert and oriented to person, place, and time.  Psychiatric:        Mood and Affect: Mood normal.        Behavior: Behavior normal.        Thought Content: Thought content normal.        Judgment: Judgment normal.    The 10-year ASCVD risk score (Arnett DK, et al., 2019) is: 9.8%    Assessment & Plan:   Problem List Items Addressed This Visit       Other   Generalized anxiety disorder - Primary   Chronic, ongoing. He experiences chronic anxiety with medication intolerance, currently managed with Xanax . He did not tolerate the prozac , and has tried mulitple other medications including buspar  in the past. Gene[employer]ight testing and a psychiatry referral are under consideration. Ashwagandha was discussed for anxiety and sleep. Refill Xanax , with a dosage of half to one  tablet two to three times a day as needed. PDMP reviewed. Discuss GeneSight testing with insurance for coverage determination. Consider a psychiatry referral for medication management. Discuss the use of ashwagandha for anxiety and sleep.        Relevant Medications   ALPRAZolam  (XANAX ) 0.25 MG tablet   Other Visit Diagnoses       Anxiety       Relevant Medications   ALPRAZolam  (XANAX ) 0.25 MG tablet       Return in about 3 months (around 09/06/2023) for CPE.    Dillon Benjamin, NP

## 2023-06-06 NOTE — Assessment & Plan Note (Signed)
 Chronic, ongoing. He experiences chronic anxiety with medication intolerance, currently managed with Xanax . He did not tolerate the prozac , and has tried mulitple other medications including buspar  in the past. GeneSight testing and a psychiatry referral are under consideration. Ashwagandha was discussed for anxiety and sleep. Refill Xanax , with a dosage of half to one tablet two to three times a day as needed. PDMP reviewed. Discuss GeneSight testing with insurance for coverage determination. Consider a psychiatry referral for medication management. Discuss the use of ashwagandha for anxiety and sleep.

## 2023-06-13 ENCOUNTER — Encounter: Payer: Self-pay | Admitting: Gastroenterology

## 2023-06-14 ENCOUNTER — Ambulatory Visit: Payer: Self-pay | Admitting: Gastroenterology

## 2023-06-14 LAB — ADALIMUMAB+AB (SERIAL MONITOR): Adalimumab Drug Level: 16 ug/mL

## 2023-06-14 LAB — SERIAL MONITORING

## 2023-06-30 ENCOUNTER — Ambulatory Visit

## 2023-06-30 DIAGNOSIS — Z23 Encounter for immunization: Secondary | ICD-10-CM | POA: Diagnosis not present

## 2023-06-30 DIAGNOSIS — K5190 Ulcerative colitis, unspecified, without complications: Secondary | ICD-10-CM

## 2023-08-17 ENCOUNTER — Ambulatory Visit: Payer: Self-pay | Admitting: Gastroenterology

## 2023-08-17 ENCOUNTER — Encounter: Payer: Self-pay | Admitting: Gastroenterology

## 2023-08-17 VITALS — BP 134/82 | HR 80 | Ht 66.0 in | Wt 166.0 lb

## 2023-08-17 DIAGNOSIS — K91850 Pouchitis: Secondary | ICD-10-CM | POA: Diagnosis not present

## 2023-08-17 DIAGNOSIS — K589 Irritable bowel syndrome without diarrhea: Secondary | ICD-10-CM | POA: Diagnosis not present

## 2023-08-17 DIAGNOSIS — Z79620 Long term (current) use of immunosuppressive biologic: Secondary | ICD-10-CM | POA: Diagnosis not present

## 2023-08-17 DIAGNOSIS — K529 Noninfective gastroenteritis and colitis, unspecified: Secondary | ICD-10-CM

## 2023-08-17 DIAGNOSIS — Z79899 Other long term (current) drug therapy: Secondary | ICD-10-CM

## 2023-08-17 NOTE — Patient Instructions (Signed)
 _______________________________________________________  If your blood pressure at your visit was 140/90 or greater, please contact your primary care physician to follow up on this.  _______________________________________________________  If you are age 68 or older, your body mass index should be between 23-30. Your Body mass index is 26.79 kg/m. If this is out of the aforementioned range listed, please consider follow up with your Primary Care Provider.  If you are age 67 or younger, your body mass index should be between 19-25. Your Body mass index is 26.79 kg/m. If this is out of the aformentioned range listed, please consider follow up with your Primary Care Provider.   ________________________________________________________  The [facility] providers would like to encourage you to use MYCHART to communicate with providers for non-urgent requests or questions.  Due to long hold times on the telephone, sending your provider a message by [facility] may be a faster and more efficient way to get a response.  Please allow 48 business hours for a response.  Please remember that this is for non-urgent requests.  _______________________________________________________   Thank you for trusting me with your gastrointestinal care!    Dr. Victory Legrand [facility]

## 2023-08-17 NOTE — Progress Notes (Signed)
 Dillon [name] GI Progress Note  Chief Complaint: Crohn's pouchitis  Subjective  Prior history  Prior history Clinical details in my initial office consult note. 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 Developed fistulizing disease requiring seton placement with Dr. Teresa at [facility], who then referred him to our office. Pouchoscopy 02/22/2023 with endoscopic findings suggesting Crohn's disease. Mr. Schurman saw Dr. Inocente Hausen at our office for IBD treatment opinion, and she concurred with institution of anti-TNF therapy (with possible need for Skyrizi or Jak inhibitor in the future, depending on response to anti-TNF therapy).  Of note, she also raise the possibility of SIBO should the patient have persistent diarrhea despite otherwise symptomatic and clinical improvement with Humira .  Still recommendations: Prebiologic laboratory studies ordered today-hepatitis B serologies and QuantiFERON gold -these are negative.  Needs updated hepatitis B vaccination Start Humira  induction 160 mg, 80 mg, then 40 mg subcutaneously q. 2 weeks. Recommend therapeutic drug monitoring within 2 to 3 months of initiating Humira  with goal trough level 10-15 in the setting of fistulizing disease.  May require weekly dosing in the setting of aggressive phenotype.  May or may not need combination therapy in the future with an immune modulator-depends upon course Monitor disease activity with periodic fecal calprotectin assessments Restaging pouchoscopy 6 to 12 months status post initiation of Humira  Monitor routine labs every 4 to 6 months on immune suppression-CBC, CMP, ESR, CRP Recommend annual nutritional profile-iron panel, vitamin B12, folate, vitamin D  Continue vitamin D  and vitamin B supplementation Continue Lomotil  as needed for his bowel frequency If bowel frequency, nocturnal stools and incontinence remain  an issue in the future consider cycled courses of ciprofloxacin  for possible SIBO Monitor weight and anthropometrics   Started Humira  on unclear date, but probably late February or early March 2025 based on his recollection.   Reported considerable clinical improvement at that visit with Dr. Lonni Teresa at [facility] on 05/25/23, including that one of his fistula had improved and off that a seton fell out.     Discussed the use of AI scribe software for clinical note transcription with the patient, who gave verbal consent to proceed.  History of Present Illness  Clinic visit 05/31/2023, Madeleine had experienced significant symptom improvement after Humira  induction (though there may have been some miscommunication regarding that dosing I can see that office note for details). Hepatitis B vaccination series started that day See labs-B12 270 but I recommended a daily oral supplement.  Humira  trough level therapeutic  Madeleine is glad to report that he is feeling generally well.  He typically has about 5 BMs per day but has noticed that occasionally a small smears of liquid fast that he will not notice until his underwear is damp.  Denies bleeding, and he does not feel that any fistula since the last visit.  Last seton came out and he has not needed to return to Dr. Teresa. Taking B12 and vitamin D  tablets, alternating 1 every other day because one of them seems to make her feel bloated (most likely vitamin D ). He is self administering the Humira  injections every other week and does not seem to be having any reactions or adverse effects.  ROS: Cardiovascular:  no chest pain Respiratory: no dyspnea  The patient's Past Medical, Family and Social History were reviewed and are on file in the EMR. Past Medical History:  Diagnosis Date   Allergy 2000   Sometimes  Anal fissure    Anxiety Years   Cataract 3/25   Depression 2001   Occasionally   DJD (degenerative joint disease)    Elevated hemoglobin  A1c    Hyperlipidemia    Hypertension    Pouchitis (HCC)    Ulcerative colitis (HCC)    s/p colectomy    Past Surgical History:  Procedure Laterality Date   COLON SURGERY  2000, reversal 2001   coloectomy, with reversal and now a J pouch   ELBOW LIGAMENT RECONSTRUCTION Right 08/01/2012   Dr Ahmad   ELBOW LIGAMENT RECONSTRUCTION Left 10/03/2014   Dr Ahmad   ELBOW SURGERY Right 02/02/2012   FLEXIBLE SIGMOIDOSCOPY N/A 12/02/2022   Procedure: FLEXIBLE SIGMOIDOSCOPY;  Surgeon: Teresa Lonni HERO, MD;  Location: [facility];  Service: General;  Laterality: N/A;   HAND SURGERY Right 02/01/1973   HAND SURGERY Right 02/01/1973   PLACEMENT OF SETON N/A 12/02/2022   Procedure: PLACEMENT OF SETON;  Surgeon: Teresa Lonni HERO, MD;  Location: [facility];  Service: General;  Laterality: N/A;   POUCHOSCOPY     RECTAL EXAM UNDER ANESTHESIA N/A 12/02/2022   Procedure: RECTAL EXAM UNDER ANESTHESIA;  Surgeon: Teresa Lonni HERO, MD;  Location: [facility];  Service: General;  Laterality: N/A;   SMALL INTESTINE SURGERY  2000     Objective:  Med list reviewed  Current Outpatient Medications:    acetaminophen  (TYLENOL ) 500 MG tablet, Take 500 mg by mouth as needed., Disp: , Rfl:    adalimumab  (HUMIRA , 2 PEN,) 40 MG/0.4ML pen, Inject 0.4 mLs (40 mg total) into the skin every 14 (fourteen) days., Disp: 2 each, Rfl: 11   ALPRAZolam  (XANAX ) 0.25 MG tablet, TAKE 1/2 TO 1 TABLET BY MOUTH 2 TO 3 TIMES PER DAY AS NEEDED FOR ANXIETY, Disp: 60 tablet, Rfl: 0   B Complex-C (B-COMPLEX WITH VITAMIN C) tablet, Take 1 tablet by mouth daily., Disp: , Rfl:    Cholecalciferol (VITAMIN D  PO), Take 200 Units by mouth daily. , Disp: , Rfl:    meloxicam  (MOBIC ) 7.5 MG tablet, Take 1 tablet (7.5 mg total) by mouth as needed., Disp: 90 tablet, Rfl: 1   rosuvastatin  (CRESTOR ) 5 MG tablet, TAKE 1 TABLET BY MOUTH 3 TIMES PER WEEK(MONDAY, WEDNESDAY, AND FRIDAY)., Disp: 39  tablet, Rfl: 3   diphenoxylate -atropine  (LOMOTIL ) 2.5-0.025 MG tablet, TAKE 1-2 TABLET BY MOUTH FOR UP TO FOUR TIMES DAILY AS NEEDED FOR DIARRIHEA MAX 4 TIMES DAILY (Patient not taking: Reported on 08/17/2023), Disp: 30 tablet, Rfl: 0   ipratropium (ATROVENT ) 0.06 % nasal spray, Use 1 to 2 sprays each nostril 2 to 3 x /day as needed (Patient not taking: Reported on 08/17/2023), Disp: 45 mL, Rfl: 3   Vital signs in last 24 hrs: Vitals:   08/17/23 1358  BP: 134/82  Pulse: 80   Wt Readings from Last 3 Encounters:  08/17/23 166 lb (75.3 kg)  06/06/23 167 lb (75.8 kg)  05/31/23 165 lb (74.8 kg)    Physical Exam  Well-appearing HEENT: sclera anicteric, oral mucosa moist without lesions Neck: supple, no thyromegaly, JVD or lymphadenopathy Cardiac: Regular without murmur,  no peripheral edema Pulm: clear to auscultation bilaterally, normal RR and effort noted Abdomen: soft, no tenderness, with active bowel sounds. No guarding or palpable hepatosplenomegaly. Skin; warm and dry, no jaundice or rash No perianal or DRE done today  Labs: Lab Results  Component Value Date   VITAMINB12 270 06/06/2023     Humira  trough  drug level 16 with negative antibodies on 06/06/2023  ___________________________________________ Radiologic studies:  Last fecal calprotectin was normal at 72 and   December 2024 when first seen with active pouchitis/Crohn's ____________________________________________ Other:   _____________________________________________   Encounter Diagnoses  Name Primary?   IBD (inflammatory bowel disease) Yes   Pouchitis (HCC)    High risk medication use     IBD-previously ulcerative colitis status post IPAA, then condition involved to Crohn's with pouchitis and perianal fistula.  Setons out, symptomatically much improved on Humira  with good drug levels shortly into maintenance therapy. B12 for the low end of normal, currently on supplement  He has probable diminished  anorectal sensation and perhaps sphincter function from previous seton. Tolerating Humira  well with no apparent injection reactions or side effects (other than, strangely, some bloating for a few days if he injects the Humira  into the right abdominal wall but not when not on the left).  I have opted not to get a fecal calprotectin right now because it was normal at 72 when he clearly had very active pouchitis and we first met him in December 2024. So that would not seem to reliably coincide with his disease activity.  Over the years, he would get periodic symptom relief from the adjusted dosing to ciprofloxacin  and Flagyl , and that may have been a pouchitis but also suggest possibility of some degree SIBO. Assessment & Plan   Continue current treatment plan, no changes in treatment or test at this juncture.  I will see him back in about 2 to 2-1/2 months and he will call sooner if needed. Probably pouchoscopy by the end of the calendar year   Victory LITTIE Brand III

## 2023-08-29 ENCOUNTER — Other Ambulatory Visit: Payer: Self-pay | Admitting: Nurse Practitioner

## 2023-09-04 ENCOUNTER — Other Ambulatory Visit: Payer: Self-pay | Admitting: Nurse Practitioner

## 2023-09-05 ENCOUNTER — Encounter: Payer: Medicare PPO | Admitting: Internal Medicine

## 2023-09-06 ENCOUNTER — Ambulatory Visit (INDEPENDENT_AMBULATORY_CARE_PROVIDER_SITE_OTHER): Admitting: Nurse Practitioner

## 2023-09-06 ENCOUNTER — Encounter: Payer: Self-pay | Admitting: Nurse Practitioner

## 2023-09-06 VITALS — BP 124/80 | HR 64 | Temp 97.2°F | Ht 66.0 in | Wt 168.4 lb

## 2023-09-06 DIAGNOSIS — E782 Mixed hyperlipidemia: Secondary | ICD-10-CM

## 2023-09-06 DIAGNOSIS — Z0000 Encounter for general adult medical examination without abnormal findings: Secondary | ICD-10-CM | POA: Insufficient documentation

## 2023-09-06 DIAGNOSIS — R351 Nocturia: Secondary | ICD-10-CM

## 2023-09-06 DIAGNOSIS — M1990 Unspecified osteoarthritis, unspecified site: Secondary | ICD-10-CM | POA: Diagnosis not present

## 2023-09-06 DIAGNOSIS — Z87898 Personal history of other specified conditions: Secondary | ICD-10-CM

## 2023-09-06 DIAGNOSIS — K5190 Ulcerative colitis, unspecified, without complications: Secondary | ICD-10-CM | POA: Diagnosis not present

## 2023-09-06 DIAGNOSIS — F411 Generalized anxiety disorder: Secondary | ICD-10-CM

## 2023-09-06 DIAGNOSIS — E559 Vitamin D deficiency, unspecified: Secondary | ICD-10-CM

## 2023-09-06 LAB — COMPREHENSIVE METABOLIC PANEL WITH GFR
ALT: 23 U/L (ref 0–53)
AST: 25 U/L (ref 0–37)
Albumin: 4.5 g/dL (ref 3.5–5.2)
Alkaline Phosphatase: 53 U/L (ref 39–117)
BUN: 14 mg/dL (ref 6–23)
CO2: 28 meq/L (ref 19–32)
Calcium: 9.3 mg/dL (ref 8.4–10.5)
Chloride: 100 meq/L (ref 96–112)
Creatinine, Ser: 0.9 mg/dL (ref 0.40–1.50)
GFR: 88.21 mL/min (ref >60.00)
Glucose, Bld: 102 mg/dL — ABNORMAL HIGH (ref 70–99)
Potassium: 3.7 meq/L (ref 3.5–5.1)
Sodium: 136 meq/L (ref 135–145)
Total Bilirubin: 0.7 mg/dL (ref 0.2–1.2)
Total Protein: 7 g/dL (ref 6.0–8.3)

## 2023-09-06 LAB — CBC WITH DIFFERENTIAL/PLATELET
Basophils Absolute: 0.1 K/uL (ref 0.0–0.1)
Basophils Relative: 0.9 % (ref 0.0–3.0)
Eosinophils Absolute: 0.3 K/uL (ref 0.0–0.7)
Eosinophils Relative: 4.1 % (ref 0.0–5.0)
HCT: 43.5 % (ref 39.0–52.0)
Hemoglobin: 14.7 g/dL (ref 13.0–17.0)
Lymphocytes Relative: 42.4 % (ref 12.0–46.0)
Lymphs Abs: 2.6 K/uL (ref 0.7–4.0)
MCHC: 33.7 g/dL (ref 30.0–36.0)
MCV: 93.4 fl (ref 78.0–100.0)
Monocytes Absolute: 0.7 K/uL (ref 0.1–1.0)
Monocytes Relative: 11.2 % (ref 3.0–12.0)
Neutro Abs: 2.5 K/uL (ref 1.4–7.7)
Neutrophils Relative %: 41.4 % — ABNORMAL LOW (ref 43.0–77.0)
Platelets: 282 K/uL (ref 150.0–400.0)
RBC: 4.66 Mil/uL (ref 4.22–5.81)
RDW: 14.7 % (ref 11.5–15.5)
WBC: 6.1 K/uL (ref 4.0–10.5)

## 2023-09-06 LAB — LIPID PANEL
Cholesterol: 245 mg/dL — ABNORMAL HIGH (ref 0–200)
HDL: 83.3 mg/dL (ref >39.00)
LDL Cholesterol: 122 mg/dL — ABNORMAL HIGH (ref 0–99)
NonHDL: 161.39
Total CHOL/HDL Ratio: 3
Triglycerides: 195 mg/dL — ABNORMAL HIGH (ref 0.0–149.0)
VLDL: 39 mg/dL (ref 0.0–40.0)

## 2023-09-06 LAB — HEMOGLOBIN A1C: Hgb A1c MFr Bld: 5.9 % (ref 4.6–6.5)

## 2023-09-06 LAB — VITAMIN D 25 HYDROXY (VIT D DEFICIENCY, FRACTURES): VITD: 33.43 ng/mL (ref 30.00–100.00)

## 2023-09-06 LAB — PSA: PSA: 0.82 ng/mL (ref 0.10–4.00)

## 2023-09-06 MED ORDER — ROSUVASTATIN CALCIUM 5 MG PO TABS: ORAL_TABLET | ORAL | 3 refills | Status: DC

## 2023-09-06 NOTE — Assessment & Plan Note (Signed)
Health maintenance reviewed and updated. Discussed nutrition, exercise. Follow-up 1 year.

## 2023-09-06 NOTE — Assessment & Plan Note (Signed)
 Chronic, stable. Continue xanax  0.125- 0.25mg  BID prn anxiety.

## 2023-09-06 NOTE — Assessment & Plan Note (Signed)
 His cholesterol is well-controlled with rosuvastatin . Continue rosuvastatin  5mg  three times a week. Check CMP, CBC, lipid panel today

## 2023-09-06 NOTE — Assessment & Plan Note (Signed)
 Continue vitamin D  supplement OTC daily. Check vitamin D  and adjust regimen based on results.

## 2023-09-06 NOTE — Progress Notes (Signed)
 BP 124/80 (BP Location: Left Arm, Patient Position: Sitting, Cuff Size: Normal)   Pulse 64   Temp (!) 97.2 F (36.2 C)   Ht 5' 6 (1.676 m)   Wt 168 lb 6.4 oz (76.4 kg)   SpO2 98%   BMI 27.18 kg/m    Subjective:    Patient ID: Dillon [name], male    DOB: Mar 11, 1955, 68 y.o.   MRN: [id number]  CC: Chief Complaint  Patient presents with   Annual Exam    With fasting lab work, no concerns, Rx refill    HPI: Dillon [name] is a 68 y.o. male presenting on 09/06/2023 for comprehensive medical examination. Current medical complaints include:none  He currently lives with: wife  Depression and Anxiety Screen done today and results listed below:     09/06/2023    8:31 AM 06/06/2023    8:24 AM 05/05/2023    1:45 PM 08/24/2022    1:32 AM 08/19/2021   11:01 PM  Depression screen PHQ 2/9  Decreased Interest 0 2 3 0 0  Down, Depressed, Hopeless 1 1 1  0 0  PHQ - 2 Score 1 3 4  0 0  Altered sleeping 2 3 2     Tired, decreased energy 0 2 1    Change in appetite 0 0 0    Feeling bad or failure about yourself  1 0 0    Trouble concentrating 0 0 1    Moving slowly or fidgety/restless 0 0 0    Suicidal thoughts 0 0 0    PHQ-9 Score 4 8 8     Difficult doing work/chores Somewhat difficult Somewhat difficult Not difficult at all        09/06/2023    8:31 AM 06/06/2023    8:24 AM 05/05/2023    1:46 PM 10/22/2020    3:56 PM  GAD 7 : Generalized Anxiety Score  Nervous, Anxious, on Edge 2 2 2 3   Control/stop worrying 2 1 2 3   Worry too much - different things 2 2 2 3   Trouble relaxing 1 1 0 1  Restless 0 0 0 0  Easily annoyed or irritable 1 0 0 3  Afraid - awful might happen 2 2 2 3   Total GAD 7 Score 10 8 8 16   Anxiety Difficulty Not difficult at all Somewhat difficult Not difficult at all Somewhat difficult    The patient does not have a history of falls. I did not complete a risk assessment for falls. A plan of care for falls was not documented.   Past Medical History:  Past  Medical History:  Diagnosis Date   Allergy 2000   Sometimes   Anal fissure    Anxiety Years   Cataract 3/25   Depression 2001   Occasionally   DJD (degenerative joint disease)    Elevated hemoglobin A1c    Hyperlipidemia    Hypertension    Pouchitis (HCC)    Ulcerative colitis (HCC)    s/p colectomy    Surgical History:  Past Surgical History:  Procedure Laterality Date   COLON SURGERY  2000, reversal 2001   coloectomy, with reversal and now a J pouch   ELBOW LIGAMENT RECONSTRUCTION Right 08/01/2012   Dr Ahmad   ELBOW LIGAMENT RECONSTRUCTION Left 10/03/2014   Dr Ahmad   ELBOW SURGERY Right 02/02/2012   FLEXIBLE SIGMOIDOSCOPY N/A 12/02/2022   Procedure: FLEXIBLE SIGMOIDOSCOPY;  Surgeon: Teresa Lonni HERO, MD;  Location: [facility];  Service: General;  Laterality: N/A;   HAND SURGERY Right 02/01/1973   HAND SURGERY Right 02/01/1973   PLACEMENT OF SETON N/A 12/02/2022   Procedure: PLACEMENT OF SETON;  Surgeon: Teresa Lonni HERO, MD;  Location: [facility];  Service: General;  Laterality: N/A;   POUCHOSCOPY     RECTAL EXAM UNDER ANESTHESIA N/A 12/02/2022   Procedure: RECTAL EXAM UNDER ANESTHESIA;  Surgeon: Teresa Lonni HERO, MD;  Location: [facility];  Service: General;  Laterality: N/A;   SMALL INTESTINE SURGERY  2000    Medications:  Current Outpatient Medications on File Prior to Visit  Medication Sig   acetaminophen  (TYLENOL ) 500 MG tablet Take 500 mg by mouth as needed.   adalimumab  (HUMIRA , 2 PEN,) 40 MG/0.4ML pen Inject 0.4 mLs (40 mg total) into the skin every 14 (fourteen) days.   ALPRAZolam  (XANAX ) 0.25 MG tablet TAKE 1/2 TO 1 TABLET BY MOUTH 2 TO 3 TIMES PER DAY AS NEEDED FOR ANXIETY (Patient taking differently: as needed. TAKE 1/2 TO 1 TABLET BY MOUTH 2 TO 3 TIMES PER DAY AS NEEDED FOR ANXIETY)   B Complex-C (B-COMPLEX WITH VITAMIN C) tablet Take 1 tablet by mouth daily.   Cholecalciferol (VITAMIN D  PO)  Take 200 Units by mouth daily.    diphenoxylate -atropine  (LOMOTIL ) 2.5-0.025 MG tablet TAKE 1-2 TABLET BY MOUTH FOR UP TO FOUR TIMES DAILY AS NEEDED FOR DIARRIHEA MAX 4 TIMES DAILY (Patient taking differently: 4 (four) times daily as needed. TAKE 1-2 TABLET BY MOUTH FOR UP TO FOUR TIMES DAILY AS NEEDED FOR DIARRIHEA MAX 4 TIMES DAILY)   meloxicam  (MOBIC ) 7.5 MG tablet Take 1 tablet (7.5 mg total) by mouth as needed.   ipratropium (ATROVENT ) 0.06 % nasal spray Use 1 to 2 sprays each nostril 2 to 3 x /day as needed (Patient not taking: Reported on 09/06/2023)   No current facility-administered medications on file prior to visit.    Allergies:  Allergies  Allergen Reactions   Codeine Nausea And Vomiting   Norco [Hydrocodone -Acetaminophen ] Nausea And Vomiting    Per pt can take tylenol    Penicillins Nausea And Vomiting    Childhood rxn    Social History:  Social History   Socioeconomic History   Marital status: Married    Spouse name: Not on file   Number of children: 3   Years of education: Not on file   Highest education level: 12th grade  Occupational History   Occupation: Counsellor  Tobacco Use   Smoking status: Never   Smokeless tobacco: Never  Vaping Use   Vaping status: Never Used  Substance and Sexual Activity   Alcohol use: Yes    Alcohol/week: 10.0 standard drinks of alcohol    Types: 10 Standard drinks or equivalent per week    Comment: occa smixed drink   Drug use: No   Sexual activity: Yes    Birth control/protection: None  Other Topics Concern   Not on file  Social History Narrative   Not on file   Social Drivers of Health   Financial Resource Strain: Low Risk  (05/04/2023)   Overall Financial Resource Strain (CARDIA)    Difficulty of Paying Living Expenses: Not very hard  Food Insecurity: No Food Insecurity (05/04/2023)   Hunger Vital Sign    Worried About Running Out of Food in the Last Year: Never true    Ran Out of Food in the Last Year: Never true   Transportation Needs: No Transportation Needs (05/04/2023)   PRAPARE - Transportation  Lack of Transportation (Medical): No    Lack of Transportation (Non-Medical): No  Physical Activity: Sufficiently Active (05/04/2023)   Exercise Vital Sign    Days of Exercise per Week: 4 days    Minutes of Exercise per Session: 40 min  Stress: Stress Concern Present (05/04/2023)   [employer] of Occupational Health - Occupational Stress Questionnaire    Feeling of Stress : Very much  Social Connections: Socially Isolated (05/04/2023)   Social Connection and Isolation Panel    Frequency of Communication with Friends and Family: Once a week    Frequency of Social Gatherings with Friends and Family: Never    Attends Religious Services: Never    Diplomatic Services operational officer: No    Attends Engineer, structural: Not on file    Marital Status: Married  Catering manager Violence: Not on file   Social History   Tobacco Use  Smoking Status Never  Smokeless Tobacco Never   Social History   Substance and Sexual Activity  Alcohol Use Yes   Alcohol/week: 10.0 standard drinks of alcohol   Types: 10 Standard drinks or equivalent per week   Comment: occa smixed drink    Family History:  Family History  Problem Relation Age of Onset   Diabetes Mother    Arthritis Mother        Rheumatoid   Heart disease Father    Gout Father    Melanoma Father    Depression Sister    Suicidality Sister    Breast cancer Sister    Ulcerative colitis Maternal Grandfather    Drug abuse Son    Drug abuse Son    Liver disease Neg Hx    Colon cancer Neg Hx    Esophageal cancer Neg Hx    Rectal cancer Neg Hx    Stomach cancer Neg Hx     Past medical history, surgical history, medications, allergies, family history and social history reviewed with patient today and changes made to appropriate areas of the chart.   Review of Systems  Constitutional: Negative.   HENT: Negative.    Eyes:  Negative.   Respiratory: Negative.    Cardiovascular: Negative.   Gastrointestinal: Negative.   Genitourinary: Negative.   Musculoskeletal:  Positive for joint pain.  Skin: Negative.   Neurological: Negative.   Psychiatric/Behavioral: Negative.     All other ROS negative except what is listed above and in the HPI.      Objective:    BP 124/80 (BP Location: Left Arm, Patient Position: Sitting, Cuff Size: Normal)   Pulse 64   Temp (!) 97.2 F (36.2 C)   Ht 5' 6 (1.676 m)   Wt 168 lb 6.4 oz (76.4 kg)   SpO2 98%   BMI 27.18 kg/m   Wt Readings from Last 3 Encounters:  09/06/23 168 lb 6.4 oz (76.4 kg)  08/17/23 166 lb (75.3 kg)  06/06/23 167 lb (75.8 kg)    Physical Exam Vitals and nursing note reviewed.  Constitutional:      General: He is not in acute distress.    Appearance: Normal appearance.  HENT:     Head: Normocephalic and atraumatic.     Right Ear: Tympanic membrane, ear canal and external ear normal.     Left Ear: Tympanic membrane, ear canal and external ear normal.     Mouth/Throat:     Mouth: Mucous membranes are moist.     Pharynx: No posterior oropharyngeal erythema.  Eyes:  Conjunctiva/sclera: Conjunctivae normal.  Cardiovascular:     Rate and Rhythm: Normal rate and regular rhythm.     Pulses: Normal pulses.     Heart sounds: Normal heart sounds.  Pulmonary:     Effort: Pulmonary effort is normal.     Breath sounds: Normal breath sounds.  Abdominal:     Palpations: Abdomen is soft.     Tenderness: There is no abdominal tenderness.  Musculoskeletal:        General: Normal range of motion.     Cervical back: Normal range of motion and neck supple. No tenderness.     Right lower leg: No edema.     Left lower leg: No edema.  Lymphadenopathy:     Cervical: No cervical adenopathy.  Skin:    General: Skin is warm and dry.  Neurological:     General: No focal deficit present.     Mental Status: He is alert and oriented to person, place, and time.      Cranial Nerves: No cranial nerve deficit.     Coordination: Coordination normal.     Gait: Gait normal.  Psychiatric:        Mood and Affect: Mood normal.        Behavior: Behavior normal.        Thought Content: Thought content normal.        Judgment: Judgment normal.     Results for orders placed or performed in visit on 06/06/23  Adalimumab +Ab (Serial Monitor)   Collection Time: 06/06/23  1:30 PM  Result Value Ref Range   Adalimumab  Drug Level 16 ug/mL   Anti-Adalimumab  Antibody <25 ng/mL  IBC + Ferritin   Collection Time: 06/06/23  1:30 PM  Result Value Ref Range   Iron 131 42 - 165 ug/dL   Transferrin 670.9 787.9 - 360.0 mg/dL   Saturation Ratios 71.5 20.0 - 50.0 %   Ferritin 69.5 22.0 - 322.0 ng/mL   TIBC 460.6 (H) 250.0 - 450.0 mcg/dL  CBC w/Diff   Collection Time: 06/06/23  1:30 PM  Result Value Ref Range   WBC 7.9 4.0 - 10.5 K/uL   RBC 4.59 4.22 - 5.81 Mil/uL   Hemoglobin 14.3 13.0 - 17.0 g/dL   HCT 57.4 60.9 - 47.9 %   MCV 92.6 78.0 - 100.0 fl   MCHC 33.6 30.0 - 36.0 g/dL   RDW 85.5 88.4 - 84.4 %   Platelets 359.0 150.0 - 400.0 K/uL   Neutrophils Relative % 41.4 (L) 43.0 - 77.0 %   Lymphocytes Relative 46.3 (H) 12.0 - 46.0 %   Monocytes Relative 7.4 3.0 - 12.0 %   Eosinophils Relative 2.3 0.0 - 5.0 %   Basophils Relative 2.6 0.0 - 3.0 %   Neutro Abs 3.3 1.4 - 7.7 K/uL   Lymphs Abs 3.6 0.7 - 4.0 K/uL   Monocytes Absolute 0.6 0.1 - 1.0 K/uL   Eosinophils Absolute 0.2 0.0 - 0.7 K/uL   Basophils Absolute 0.2 (H) 0.0 - 0.1 K/uL  Folate   Collection Time: 06/06/23  1:30 PM  Result Value Ref Range   Folate 11.8 >5.9 ng/mL  B12   Collection Time: 06/06/23  1:30 PM  Result Value Ref Range   Vitamin B-12 270 211 - 911 pg/mL  Serial Monitoring   Collection Time: 06/06/23  1:30 PM  Result Value Ref Range   Serial Monitoring WILL FOLLOW       Assessment & Plan:   Problem List Items Addressed This Visit  Digestive   Ulcerative colitis (HCC)    He had surgery for ulcerative colitis years ago and was recently diagnosed with Crohn's disease. He is managed with Humira , with the second dose scheduled. Seton removal is planned 6-8 weeks after starting Humira . He experiences abdominal tenderness. A follow-up with the gastroenterologist is scheduled. Continue collaboration and recommendations from GI and surgery.        Endocrine   History of prediabetes   Check A1c today and treat based on results.       Relevant Orders   Hemoglobin A1c     Musculoskeletal and Integument   DJD (degenerative joint disease)   Chronic, stable. Continue meloxican 7.5mg  daily as needed.         Other   Mixed hyperlipidemia   His cholesterol is well-controlled with rosuvastatin . Continue rosuvastatin  5mg  three times a week. Check CMP, CBC, lipid panel today      Relevant Medications   rosuvastatin  (CRESTOR ) 5 MG tablet   Other Relevant Orders   Comprehensive metabolic panel with GFR   CBC with Differential/Platelet   Lipid panel   Vitamin D  deficiency   Continue vitamin D  supplement OTC daily. Check vitamin D  and adjust regimen based on results.       Relevant Orders   VITAMIN D  25 Hydroxy (Vit-D Deficiency, Fractures)   Generalized anxiety disorder   Chronic, stable. Continue xanax  0.125- 0.25mg  BID prn anxiety.        Routine general medical examination at a health care facility - Primary   Health maintenance reviewed and updated. Discussed nutrition, exercise. Follow-up 1 year.        Other Visit Diagnoses       Nocturia       Check PSA today   Relevant Orders   PSA        LABORATORY TESTING:  Health maintenance labs ordered today as discussed above.   The natural history of prostate cancer and ongoing controversy regarding screening and potential treatment outcomes of prostate cancer has been discussed with the patient. The meaning of a false positive PSA and a false negative PSA has been discussed. He indicates  understanding of the limitations of this screening test and wishes to proceed with screening PSA testing.   IMMUNIZATIONS:   - Tdap: Tetanus vaccination status reviewed: last tetanus booster within 10 years. - Influenza: Postponed to flu season - Pneumovax: Not applicable - Prevnar: Up to date - HPV: Not applicable - Shingrix vaccine: Up to date  SCREENING: - Colonoscopy: Not applicable  Discussed with patient purpose of the colonoscopy is to detect colon cancer at curable precancerous or early stages   - AAA Screening: Not applicable   PATIENT COUNSELING:    Sexuality: Discussed sexually transmitted diseases, partner selection, use of condoms, avoidance of unintended pregnancy  and contraceptive alternatives.   Advised to avoid cigarette smoking.  I discussed with the patient that most people either abstain from alcohol or drink within safe limits (<=14/week and <=4 drinks/occasion for males, <=7/weeks and <= 3 drinks/occasion for females) and that the risk for alcohol disorders and other health effects rises proportionally with the number of drinks per week and how often a drinker exceeds daily limits.  Discussed cessation/primary prevention of drug use and availability of treatment for abuse.   Diet: Encouraged to adjust caloric intake to maintain  or achieve ideal body weight, to reduce intake of dietary saturated fat and total fat, to limit sodium intake by avoiding high sodium foods and  not adding table salt, and to maintain adequate dietary potassium and calcium  preferably from fresh fruits, vegetables, and low-fat dairy products.    stressed the importance of regular exercise  Injury prevention: Discussed safety belts, safety helmets, smoke detector, smoking near bedding or upholstery.   Dental health: Discussed importance of regular tooth brushing, flossing, and dental visits.   Follow up plan: NEXT PREVENTATIVE PHYSICAL DUE IN 1 YEAR. Return in about 1 year (around  09/05/2024) for CPE.  Suraj Ramdass A Arva Slaugh

## 2023-09-06 NOTE — Assessment & Plan Note (Signed)
 Chronic, stable. Continue meloxican 7.5mg  daily as needed.

## 2023-09-06 NOTE — Assessment & Plan Note (Signed)
 He had surgery for ulcerative colitis years ago and was recently diagnosed with Crohn's disease. He is managed with Humira, with the second dose scheduled. Seton removal is planned 6-8 weeks after starting Humira. He experiences abdominal tenderness. A follow-up with the gastroenterologist is scheduled. Continue collaboration and recommendations from GI and surgery.

## 2023-09-06 NOTE — Assessment & Plan Note (Signed)
Check A1c today and treat based on results.  

## 2023-09-06 NOTE — Patient Instructions (Signed)
 It was great to see you!  We are checking your labs today and will let you know the results via mychart/phone.   I have refilled your rosuvastatin   Let's follow-up in 1 year, sooner if you have concerns.  If a referral was placed today, you will be contacted for an appointment. Please note that routine referrals can sometimes take up to 3-4 weeks to process. Please call our office if you haven't heard anything after this time frame.  Take care,  Tinnie Harada, NP

## 2023-09-07 ENCOUNTER — Ambulatory Visit: Payer: Self-pay | Admitting: Nurse Practitioner

## 2023-09-07 MED ORDER — ROSUVASTATIN CALCIUM 10 MG PO TABS: 10.0000 mg | ORAL_TABLET | ORAL | 3 refills | Status: AC

## 2023-09-08 ENCOUNTER — Encounter: Payer: Medicare PPO | Admitting: Internal Medicine

## 2023-10-06 DIAGNOSIS — L814 Other melanin hyperpigmentation: Secondary | ICD-10-CM | POA: Diagnosis not present

## 2023-10-06 DIAGNOSIS — Z808 Family history of malignant neoplasm of other organs or systems: Secondary | ICD-10-CM | POA: Diagnosis not present

## 2023-10-06 DIAGNOSIS — L570 Actinic keratosis: Secondary | ICD-10-CM | POA: Diagnosis not present

## 2023-10-06 DIAGNOSIS — L821 Other seborrheic keratosis: Secondary | ICD-10-CM | POA: Diagnosis not present

## 2023-10-06 DIAGNOSIS — D1801 Hemangioma of skin and subcutaneous tissue: Secondary | ICD-10-CM | POA: Diagnosis not present

## 2023-10-26 ENCOUNTER — Encounter: Payer: Self-pay | Admitting: Gastroenterology

## 2023-10-26 ENCOUNTER — Ambulatory Visit: Admitting: Gastroenterology

## 2023-10-26 VITALS — BP 120/62 | HR 74 | Ht 66.0 in | Wt 166.4 lb

## 2023-10-26 DIAGNOSIS — K91850 Pouchitis: Secondary | ICD-10-CM

## 2023-10-26 DIAGNOSIS — K529 Noninfective gastroenteritis and colitis, unspecified: Secondary | ICD-10-CM | POA: Diagnosis not present

## 2023-10-26 DIAGNOSIS — R140 Abdominal distension (gaseous): Secondary | ICD-10-CM

## 2023-10-26 DIAGNOSIS — Z79899 Other long term (current) drug therapy: Secondary | ICD-10-CM | POA: Diagnosis not present

## 2023-10-26 DIAGNOSIS — Z7960 Long term (current) use of unspecified immunomodulators and immunosuppressants: Secondary | ICD-10-CM

## 2023-10-26 MED ORDER — METRONIDAZOLE 500 MG PO TABS: 500.0000 mg | ORAL_TABLET | Freq: Two times a day (BID) | ORAL | 0 refills | Status: AC

## 2023-10-26 MED ORDER — NA SULFATE-K SULFATE-MG SULF 17.5-3.13-1.6 GM/177ML PO SOLN: 1.0000 | ORAL | 0 refills | Status: AC

## 2023-10-26 NOTE — Progress Notes (Signed)
 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 Developed fistulizing disease requiring seton placement with Dr. Teresa at [facility], who then referred him to our office. Pouchoscopy 02/22/2023 with endoscopic findings suggesting Crohn's disease. Mr. Dillon [name] saw Dr. Inocente [name] at our office for IBD treatment opinion, and she concurred with institution of anti-TNF therapy (with possible need for Skyrizi or Jak inhibitor in the future, depending on response to anti-TNF therapy).  Of note, she also raise the possibility of SIBO should the patient have persistent diarrhea despite otherwise symptomatic and clinical improvement with Humira  Begin Humira  February at standard dosing 2025 -good trough levels early into treatment (May 2025).  Dillon [name] was doing well and I last saw him in July of this year.  Discussed the use of AI scribe software for clinical note transcription with the patient, who gave verbal consent to proceed.  History of Present Illness  Dillon [name] has generally been feeling well since I last saw him.  Stool becoming more formed though still some intermittent loose stool or urgency.  When that seems to occur for a day or 2 he will take a single half tablet of Flagyl  that he had at home and things get right back to normal. He has also experienced some upper abdominal bloating that causes more prominence of his rectus diastasis the day of and after his Humira  injections.  No rectal bleeding.  Appetite good and no vomiting Denies pain or drainage from the anal/perineal area  ROS: Cardiovascular:  no chest pain Respiratory: no dyspnea No injection site reactions Denies rash or joint pain The patient's Past Medical, Family and Social History were reviewed and are on file in the  EM[facility]. Past Medical History:  Diagnosis Date   Allergy 2000   Sometimes   Anal fissure    Anxiety Years   Cataract 3/25   Depression 2001   Occasionally   DJD (degenerative joint disease)    Elevated hemoglobin A1c    Hyperlipidemia    Hypertension    Pouchitis (HCC)    Ulcerative colitis (HCC)    s/p colectomy    Past Surgical History:  Procedure Laterality Date   COLON SURGERY  2000, reversal 2001   coloectomy, with reversal and now a J pouch   ELBOW LIGAMENT RECONSTRUCTION Right 08/01/2012   Dr Ahmad   ELBOW LIGAMENT RECONSTRUCTION Left 10/03/2014   Dr Ahmad   ELBOW SURGERY Right 02/02/2012   FLEXIBLE SIGMOIDOSCOPY N/A 12/02/2022   Procedure: FLEXIBLE SIGMOIDOSCOPY;  Surgeon: Teresa Lonni HERO, MD;  Location: [facility];  Service: General;  Laterality: N/A;   HAND SURGERY Right 02/01/1973   HAND SURGERY Right 02/01/1973   PLACEMENT OF SETON N/A 12/02/2022   Procedure: PLACEMENT OF SETON;  Surgeon: Teresa Lonni HERO, MD;  Location: [facility];  Service: General;  Laterality: N/A;   POUCHOSCOPY     RECTAL EXAM UNDER ANESTHESIA N/A 12/02/2022   Procedure: RECTAL EXAM UNDER ANESTHESIA;  Surgeon: Teresa Lonni HERO, MD;  Location: [facility];  Service: General;  Laterality: N/A;   SMALL INTESTINE SURGERY  2000     Objective:  Med list reviewed  Current Outpatient Medications:    acetaminophen  (TYLENOL ) 500 MG tablet, Take 500 mg by mouth as needed., Disp: , Rfl:    adalimumab  (HUMIRA , 2  PEN,) 40 MG/0.4ML pen, Inject 0.4 mLs (40 mg total) into the skin every 14 (fourteen) days., Disp: 2 each, Rfl: 11   ALPRAZolam  (XANAX ) 0.25 MG tablet, TAKE 1/2 TO 1 TABLET BY MOUTH 2 TO 3 TIMES PER DAY AS NEEDED FOR ANXIETY, Disp: 60 tablet, Rfl: 0   B Complex-C (B-COMPLEX WITH VITAMIN C) tablet, Take 1 tablet by mouth daily., Disp: , Rfl:    Cholecalciferol (VITAMIN D  PO), Take 200 Units by mouth daily. , Disp: , Rfl:     diphenoxylate -atropine  (LOMOTIL ) 2.5-0.025 MG tablet, TAKE 1-2 TABLET BY MOUTH FOR UP TO FOUR TIMES DAILY AS NEEDED FOR DIARRIHEA MAX 4 TIMES DAILY, Disp: 30 tablet, Rfl: 0   ipratropium (ATROVENT ) 0.06 % nasal spray, Use 1 to 2 sprays each nostril 2 to 3 x /day as needed (Patient taking differently: Place 1 spray into both nostrils as needed for rhinitis. Use 1 to 2 sprays each nostril 2 to 3 x /day as needed), Disp: 45 mL, Rfl: 3   meloxicam  (MOBIC ) 7.5 MG tablet, Take 1 tablet (7.5 mg total) by mouth as needed., Disp: 90 tablet, Rfl: 1   metroNIDAZOLE  (FLAGYL ) 500 MG tablet, Take 1 tablet (500 mg total) by mouth 2 (two) times daily., Disp: 14 tablet, Rfl: 0   Na Sulfate-K Sulfate-Mg Sulfate concentrate (SUPREP BOWEL PREP KIT) 17.5-3.13-1.6 GM/177ML SOLN, Take 1 kit (354 mLs total) by mouth as directed. For colonoscopy prep, Disp: 354 mL, Rfl: 0   rosuvastatin  (CRESTOR ) 10 MG tablet, Take 1 tablet (10 mg total) by mouth 3 (three) times a week., Disp: 39 tablet, Rfl: 3   Vital signs in last 24 hrs: Vitals:   10/26/23 1359  BP: 120/62  Pulse: 74   Wt Readings from Last 3 Encounters:  10/26/23 166 lb 6 oz (75.5 kg)  09/06/23 168 lb 6.4 oz (76.4 kg)  08/17/23 166 lb (75.3 kg)    Physical Exam  Well-appearing  Cardiac: Regular without appreciable murmur,  no peripheral edema Pulm: clear to auscultation bilaterally, normal RR and effort noted Abdomen: soft, no tenderness, with active bowel sounds. No guarding or palpable hepatosplenomegaly.  Long midline scar with an upper abdominal wall rectus diastasis Skin; warm and dry, no jaundice or rash   Labs:     Latest Ref Rng & Units 09/06/2023    8:51 AM 06/06/2023    1:30 PM 08/24/2022    2:58 PM  CBC  WBC 4.0 - 10.5 K/uL 6.1  7.9  7.4   Hemoglobin 13.0 - 17.0 g/dL 85.2  85.6  85.5   Hematocrit 39.0 - 52.0 % 43.5  42.5  42.2   Platelets 150.0 - 400.0 K/uL 282.0  359.0  280       Latest Ref Rng & Units 09/06/2023    8:51 AM 08/24/2022     2:58 PM 08/20/2021   11:32 AM  CMP  Glucose 70 - 99 mg/dL 897  90  881   BUN 6 - 23 mg/dL 14  11  14    Creatinine 0.40 - 1.50 mg/dL 9.09  9.08  9.01   Sodium 135 - 145 mEq/L 136  140  139   Potassium 3.5 - 5.1 mEq/L 3.7  3.9  4.0   Chloride 96 - 112 mEq/L 100  103  101   CO2 19 - 32 mEq/L 28  28  26    Calcium  8.4 - 10.5 mg/dL 9.3  9.4  9.9   Total Protein 6.0 - 8.3 g/dL 7.0  7.4  7.5   Total Bilirubin 0.2 - 1.2 mg/dL 0.7  0.6  0.9   Alkaline Phos 39 - 117 U/L 53     AST 0 - 37 U/L 25  26  32   ALT 0 - 53 U/L 23  26  28     Vitamin D  level normal at 33 in early August ___________________________________________ Radiologic studies:   ____________________________________________ Other:   _____________________________________________   Encounter Diagnoses  Name Primary?   IBD (inflammatory bowel disease) Yes   Pouchitis (HCC)    Long-term use of immunosuppressant medication    Abdominal bloating     Assessment & Plan  Overall he is symptomatically much improved since starting Humira  monotherapy earlier this year, and hopefully this coincides with some endoscopic improvement as well.  He seems to have an unusual brief side effect of the Humira  with abdominal bloating and distention that I cannot explain but he clearly notes his related to the injections.  He also still has periodic episodes of more frequent loose stool that could be some low-grade bacterial overgrowth since it improves rapidly with just 1 dose of antibiotic.  He requested some metronidazole  to have on hand to use as needed, and he has only needed a couple of doses since I last saw him 2 months ago.  That is reasonable and I wrote a prescription for Flagyl .   Further evaluation of his pouchitis is warranted with a calprotectin and a pouchoscopy.  He was agreeable to both and they were scheduled.  After those findings, we can then determine the need for Humira  drug and antibody levels and timing of  follow-up.  He was also advised to get a flu shot this year and he plans to do so.  Victory LITTIE Brand III

## 2023-10-26 NOTE — Patient Instructions (Addendum)
 We have sent the following medications to your pharmacy for you to pick up at your convenience: Flagyl , Suprep   Your provider has requested that you go to the basement level for lab work before leaving today. Press B on the elevator. The lab is located at the first door on the left as you exit the elevator.   You have been scheduled for a flexible sigmoidoscopy. Please follow the written instructions given to you at your visit today.  If you use inhalers (even only as needed), please bring them with you on the day of your procedure.  DO NOT TAKE 7 DAYS PRIOR TO TEST- Trulicity (dulaglutide) Ozempic, Wegovy (semaglutide) Mounjaro (tirzepatide) Bydureon Bcise (exanatide extended release)  DO NOT TAKE 1 DAY PRIOR TO YOUR TEST Rybelsus (semaglutide) Adlyxin (lixisenatide) Victoza (liraglutide) Byetta (exanatide) ___________________________________________________________________________  _______________________________________________________  If your blood pressure at your visit was 140/90 or greater, please contact your primary care physician to follow up on this.  _______________________________________________________  If you are age 55 or older, your body mass index should be between 23-30. Your Body mass index is 26.85 kg/m. If this is out of the aforementioned range listed, please consider follow up with your Primary Care Provider.  If you are age 80 or younger, your body mass index should be between 19-25. Your Body mass index is 26.85 kg/m. If this is out of the aformentioned range listed, please consider follow up with your Primary Care Provider.   ________________________________________________________  The [facility] providers would like to encourage you to use MYCHART to communicate with providers for non-urgent requests or questions.  Due to long hold times on the telephone, sending your provider a message by [facility] may be a faster and more efficient way to get a  response.  Please allow 48 business hours for a response.  Please remember that this is for non-urgent requests.  _______________________________________________________  [facility] is using a team-based approach to care.  Your team is made up of your doctor and two to three APPS. Our APPS (Nurse Practitioners and Physician Assistants) work with your physician to ensure care continuity for you. They are fully qualified to address your health concerns and develop a treatment plan. They communicate directly with your gastroenterologist to care for you. Seeing the Advanced Practice Practitioners on your physician's team can help you by facilitating care more promptly, often allowing for earlier appointments, access to diagnostic testing, procedures, and other specialty referrals.   Due to recent changes in healthcare laws, you may see the results of your imaging and laboratory studies on MyChart before your provider has had a chance to review them.  We understand that in some cases there may be results that are confusing or concerning to you. Not all laboratory results come back in the same time frame and the provider may be waiting for multiple results in order to interpret others.  Please give us  48 hours in order for your provider to thoroughly review all the results before contacting the office for clarification of your results.   _______________________________________________________  If your blood pressure at your visit was 140/90 or greater, please contact your primary care physician to follow up on this.  _______________________________________________________  If you are age 68 or older, your body mass index should be between 23-30. Your Body mass index is 26.85 kg/m. If this is out of the aforementioned range listed, please consider follow up with your Primary Care Provider.  If you are age 59 or younger, your body  mass index should be between 19-25. Your Body mass index is  26.85 kg/m. If this is out of the aformentioned range listed, please consider follow up with your Primary Care Provider.   ________________________________________________________  The [facility] providers would like to encourage you to use MYCHART to communicate with providers for non-urgent requests or questions.  Due to long hold times on the telephone, sending your provider a message by [facility] may be a faster and more efficient way to get a response.  Please allow 48 business hours for a response.  Please remember that this is for non-urgent requests.  _______________________________________________________  [facility] is using a team-based approach to care.  Your team is made up of your doctor and two to three APPS. Our APPS (Nurse Practitioners and Physician Assistants) work with your physician to ensure care continuity for you. They are fully qualified to address your health concerns and develop a treatment plan. They communicate directly with your gastroenterologist to care for you. Seeing the Advanced Practice Practitioners on your physician's team can help you by facilitating care more promptly, often allowing for earlier appointments, access to diagnostic testing, procedures, and other specialty referrals.   Thank you for choosing me and  Gastro[facility].  Dr. Legrand

## 2023-10-27 ENCOUNTER — Other Ambulatory Visit (INDEPENDENT_AMBULATORY_CARE_PROVIDER_SITE_OTHER)

## 2023-10-27 ENCOUNTER — Other Ambulatory Visit

## 2023-10-27 DIAGNOSIS — Z7960 Long term (current) use of unspecified immunomodulators and immunosuppressants: Secondary | ICD-10-CM | POA: Diagnosis not present

## 2023-10-27 DIAGNOSIS — R140 Abdominal distension (gaseous): Secondary | ICD-10-CM | POA: Diagnosis not present

## 2023-10-27 DIAGNOSIS — K91850 Pouchitis: Secondary | ICD-10-CM

## 2023-10-27 DIAGNOSIS — K529 Noninfective gastroenteritis and colitis, unspecified: Secondary | ICD-10-CM

## 2023-11-03 LAB — CALPROTECTIN: Calprotectin: 7 ug/g

## 2023-11-04 ENCOUNTER — Ambulatory Visit: Payer: Self-pay | Admitting: Gastroenterology

## 2023-11-18 ENCOUNTER — Ambulatory Visit

## 2023-11-18 VITALS — BP 122/60 | HR 99 | Temp 98.2°F | Ht 67.5 in | Wt 166.2 lb

## 2023-11-18 DIAGNOSIS — Z0000 Encounter for general adult medical examination without abnormal findings: Secondary | ICD-10-CM | POA: Diagnosis not present

## 2023-11-18 NOTE — Patient Instructions (Addendum)
 Dillon [name],  Thank you for taking the time for your Medicare Wellness Visit. I appreciate your continued commitment to your health goals. Please review the care plan we discussed, and feel free to reach out if I can assist you further.  Medicare recommends these wellness visits once per year to help you and your care team stay ahead of potential health issues. These visits are designed to focus on prevention, allowing your provider to concentrate on managing your acute and chronic conditions during your regular appointments.  Please note that Annual Wellness Visits do not include a physical exam. Some assessments may be limited, especially if the visit was conducted virtually. If needed, we may recommend a separate in-person follow-up with your provider.  Ongoing Care Seeing your primary care provider every 3 to 6 months helps us  monitor your health and provide consistent, personalized care.   Referrals If a referral was made during today's visit and you haven't received any updates within two weeks, please contact the referred provider directly to check on the status.  Recommended Screenings:  Health Maintenance  Topic Date Due   COVID-19 Vaccine (7 - 2025-26 season) 10/03/2023   Medicare Annual Wellness Visit  11/17/2024   DTaP/Tdap/Td vaccine (4 - Td or Tdap) 08/31/2033   Pneumococcal Vaccine for age over 24  Completed   Flu Shot  Completed   Hepatitis C Screening  Completed   Zoster (Shingles) Vaccine  Completed   Meningitis B Vaccine  Aged Out   Hepatitis B Vaccine  Discontinued   Colon Cancer Screening  Discontinued       11/18/2023    8:57 AM  Advanced Directives  Does Patient Have a Medical Advance Directive? Yes  Type of Estate agent of [place];Living will  Copy of Healthcare Power of Attorney in Chart? No - copy requested   Advance Care Planning is important because it: Ensures you receive medical care that aligns with your values, goals, and  preferences. Provides guidance to your family and loved ones, reducing the emotional burden of decision-making during critical moments.  Vision: Annual vision screenings are recommended for early detection of glaucoma, cataracts, and diabetic retinopathy. These exams can also reveal signs of chronic conditions such as diabetes and high blood pressure.  Dental: Annual dental screenings help detect early signs of oral cancer, gum disease, and other conditions linked to overall health, including heart disease and diabetes.  Please see the attached documents for additional preventive care recommendations.

## 2023-11-18 NOTE — Progress Notes (Signed)
 Subjective:   Dillon [name] is a 67 y.o. who presents for a Medicare Wellness preventive visit.  As a reminder, Annual Wellness Visits don't include a physical exam, and some assessments may be limited, especially if this visit is performed virtually. We may recommend an in-person follow-up visit with your provider if needed.  Visit Complete: In person    Persons Participating in Visit: Patient.  AWV Questionnaire: No: Patient Medicare AWV questionnaire was not completed prior to this visit.  Cardiac Risk Factors include: advanced age (>83men, >44 women);dyslipidemia;male gender     Objective:    Today's Vitals   11/18/23 0852  BP: 122/60  Pulse: 99  Temp: 98.2 F (36.8 C)  TempSrc: Oral  SpO2: 97%  Weight: 166 lb 3.2 oz (75.4 kg)  Height: 5' 7.5 (1.715 m)   Body mass index is 25.65 kg/m.     11/18/2023    8:57 AM 12/02/2022    9:57 AM  Advanced Directives  Does Patient Have a Medical Advance Directive? Yes Yes  Type of Estate agent of [place];Living will Healthcare Power of [place];Living will  Copy of Healthcare Power of Attorney in Chart? No - copy requested No - copy requested    Current Medications (verified) Outpatient Encounter Medications as of 11/18/2023  Medication Sig   acetaminophen  (TYLENOL ) 500 MG tablet Take 500 mg by mouth as needed.   adalimumab  (HUMIRA , 2 PEN,) 40 MG/0.4ML pen Inject 0.4 mLs (40 mg total) into the skin every 14 (fourteen) days.   ALPRAZolam  (XANAX ) 0.25 MG tablet TAKE 1/2 TO 1 TABLET BY MOUTH 2 TO 3 TIMES PER DAY AS NEEDED FOR ANXIETY   B Complex-C (B-COMPLEX WITH VITAMIN C) tablet Take 1 tablet by mouth daily.   Cholecalciferol (VITAMIN D  PO) Take 200 Units by mouth daily.    diphenoxylate -atropine  (LOMOTIL ) 2.5-0.025 MG tablet TAKE 1-2 TABLET BY MOUTH FOR UP TO FOUR TIMES DAILY AS NEEDED FOR DIARRIHEA MAX 4 TIMES DAILY   ipratropium (ATROVENT ) 0.06 % nasal spray Use 1 to 2 sprays each nostril 2 to  3 x /day as needed (Patient taking differently: Place 1 spray into both nostrils as needed for rhinitis. Use 1 to 2 sprays each nostril 2 to 3 x /day as needed)   meloxicam  (MOBIC ) 7.5 MG tablet Take 1 tablet (7.5 mg total) by mouth as needed.   metroNIDAZOLE  (FLAGYL ) 500 MG tablet Take 1 tablet (500 mg total) by mouth 2 (two) times daily.   rosuvastatin  (CRESTOR ) 10 MG tablet Take 1 tablet (10 mg total) by mouth 3 (three) times a week.   Na Sulfate-K Sulfate-Mg Sulfate concentrate (SUPREP BOWEL PREP KIT) 17.5-3.13-1.6 GM/177ML SOLN Take 1 kit (354 mLs total) by mouth as directed. For colonoscopy prep (Patient not taking: Reported on 11/18/2023)   No facility-administered encounter medications on file as of 11/18/2023.    Allergies (verified) Codeine, Norco [hydrocodone -acetaminophen ], and Penicillins   History: Past Medical History:  Diagnosis Date   Allergy 2000   Sometimes   Anal fissure    Anxiety Years   Cataract 3/25   Depression 2001   Occasionally   DJD (degenerative joint disease)    Elevated hemoglobin A1c    Hyperlipidemia    Hypertension    Pouchitis (HCC)    Ulcerative colitis (HCC)    s/p colectomy   Past Surgical History:  Procedure Laterality Date   COLON SURGERY  2000, reversal 2001   coloectomy, with reversal and now a J pouch  ELBOW LIGAMENT RECONSTRUCTION Right 08/01/2012   Dr Ahmad   ELBOW LIGAMENT RECONSTRUCTION Left 10/03/2014   Dr Ahmad   ELBOW SURGERY Right 02/02/2012   FLEXIBLE SIGMOIDOSCOPY N/A 12/02/2022   Procedure: FLEXIBLE SIGMOIDOSCOPY;  Surgeon: Teresa Lonni HERO, MD;  Location: [facility];  Service: General;  Laterality: N/A;   HAND SURGERY Right 02/01/1973   HAND SURGERY Right 02/01/1973   PLACEMENT OF SETON N/A 12/02/2022   Procedure: PLACEMENT OF SETON;  Surgeon: Teresa Lonni HERO, MD;  Location: [facility];  Service: General;  Laterality: N/A;   POUCHOSCOPY     RECTAL EXAM UNDER ANESTHESIA N/A  12/02/2022   Procedure: RECTAL EXAM UNDER ANESTHESIA;  Surgeon: Teresa Lonni HERO, MD;  Location: [facility];  Service: General;  Laterality: N/A;   SMALL INTESTINE SURGERY  2000   Family History  Problem Relation Age of Onset   Diabetes Mother    Arthritis Mother        Rheumatoid   Heart disease Father    Gout Father    Melanoma Father    Depression Sister    Suicidality Sister    Breast cancer Sister    Ulcerative colitis Maternal Grandfather    Drug abuse Son    Drug abuse Son    Liver disease Neg Hx    Colon cancer Neg Hx    Esophageal cancer Neg Hx    Rectal cancer Neg Hx    Stomach cancer Neg Hx    Social History   Socioeconomic History   Marital status: Married    Spouse name: Not on file   Number of children: 3   Years of education: Not on file   Highest education level: 12th grade  Occupational History   Occupation: Counsellor  Tobacco Use   Smoking status: Never   Smokeless tobacco: Never  Vaping Use   Vaping status: Never Used  Substance and Sexual Activity   Alcohol use: Yes    Alcohol/week: 10.0 standard drinks of alcohol    Types: 10 Standard drinks or equivalent per week    Comment: occa smixed drink   Drug use: No   Sexual activity: Yes    Birth control/protection: None  Other Topics Concern   Not on file  Social History Narrative   Not on file   Social Drivers of Health   Financial Resource Strain: Low Risk  (11/18/2023)   Overall Financial Resource Strain (CARDIA)    Difficulty of Paying Living Expenses: Not hard at all  Food Insecurity: No Food Insecurity (11/18/2023)   Hunger Vital Sign    Worried About Running Out of Food in the Last Year: Never true    Ran Out of Food in the Last Year: Never true  Transportation Needs: No Transportation Needs (11/18/2023)   PRAPARE - Administrator, Civil Service (Medical): No    Lack of Transportation (Non-Medical): No  Physical Activity: Sufficiently Active  (11/18/2023)   Exercise Vital Sign    Days of Exercise per Week: 3 days    Minutes of Exercise per Session: 60 min  Stress: Stress Concern Present (11/18/2023)   [employer] of Occupational Health - Occupational Stress Questionnaire    Feeling of Stress: Rather much  Social Connections: Socially Isolated (11/18/2023)   Social Connection and Isolation Panel    Frequency of Communication with Friends and Family: Never    Frequency of Social Gatherings with Friends and Family: Never    Attends Religious Services:  Never    Active Member of Clubs or Organizations: No    Attends Banker Meetings: Never    Marital Status: Married    Tobacco Counseling Counseling given: Not Answered    Clinical Intake:  Pre-visit preparation completed: Yes  Pain : No/denies pain     Nutritional Risks: None Diabetes: No  Lab Results  Component Value Date   HGBA1C 5.9 09/06/2023   HGBA1C 5.5 08/20/2021   HGBA1C 5.5 10/22/2020     How often do you need to have someone help you when you read instructions, pamphlets, or other written materials from your doctor or pharmacy?: 1 - Never  Interpreter Needed?: No  Information entered by :: NAllen LPN   Activities of Daily Living     11/18/2023    8:53 AM 12/02/2022   10:08 AM  In your present state of health, do you have any difficulty performing the following activities:  Hearing? 0 0  Vision? 0 0  Difficulty concentrating or making decisions? 0 0  Walking or climbing stairs? 0   Dressing or bathing? 0   Doing errands, shopping? 0   Preparing Food and eating ? N   Using the Toilet? N   In the past six months, have you accidently leaked urine? N   Do you have problems with loss of bowel control? N   Managing your Medications? N   Managing your Finances? N   Housekeeping or managing your Housekeeping? N     Patient Care Team: Nedra Tinnie LABOR, NP as PCP - General (Internal Medicine)  I have updated your Care  Teams any recent Medical Services you may have received from other providers in the past year.     Assessment:   This is a routine wellness examination for [facility].  Hearing/Vision screen Hearing Screening - Comments:: Denies hearing issues Vision Screening - Comments:: Regular eye exams, [facility]   Goals Addressed             This Visit's Progress    Patient Stated       11/18/2023 stay healthy       Depression Screen     11/18/2023    8:59 AM 09/06/2023    8:31 AM 06/06/2023    8:24 AM 05/05/2023    1:45 PM 08/24/2022    1:32 AM 08/19/2021   11:01 PM 10/22/2020    3:53 PM  PHQ 2/9 Scores  PHQ - 2 Score 0 1 3 4  0 0 5  PHQ- 9 Score 3 4 8 8   10     Fall Risk     11/18/2023    8:58 AM 09/06/2023    8:31 AM 05/05/2023    1:42 PM 08/24/2022    1:31 AM 08/19/2021   11:01 PM  Fall Risk   Falls in the past year? 0 0 0 0 0  Number falls in past yr: 0 0 0    Injury with Fall? 0 0 0    Risk for fall due to : Medication side effect No Fall Risks No Fall Risks No Fall Risks No Fall Risks  Follow up Falls evaluation completed;Falls prevention discussed Falls evaluation completed Falls evaluation completed Falls prevention discussed;Education provided;Falls evaluation completed Falls prevention discussed;Education provided;Falls evaluation completed      Data saved with a previous flowsheet row definition    MEDICARE RISK AT HOME:  Medicare Risk at Home Any stairs in or around the home?: No If so, are there any without  handrails?: No Home free of loose throw rugs in walkways, pet beds, electrical cords, etc?: Yes Adequate lighting in your home to reduce risk of falls?: Yes Life alert?: No Use of a cane, walker or w/c?: No Grab bars in the bathroom?: No Shower chair or bench in shower?: Yes Elevated toilet seat or a handicapped toilet?: No  TIMED UP AND GO:  Was the test performed?  Yes  Length of time to ambulate 10 feet: 5 sec Gait steady and fast without use of  assistive device  Cognitive Function: 6CIT completed        11/18/2023    9:00 AM  6CIT Screen  What Year? 0 points  What month? 0 points  What time? 0 points  Count back from 20 0 points  Months in reverse 0 points  Repeat phrase 0 points  Total Score 0 points    Immunizations Immunization History  Administered Date(s) Administered   Fluad Quad(high Dose 65+) 11/10/2023   Hepb-cpg 05/31/2023, 06/30/2023   Influenza Inj Mdck Quad Pf 11/13/2018   Influenza Split 11/02/2013, 11/07/2014   Influenza-Unspecified 11/23/2015, 11/17/2016, 11/03/2022   Moderna Covid-19 Fall Seasonal Vaccine 66yrs & older 11/20/2021, 11/05/2022   Moderna Sars-Covid-2 Vaccination 12/20/2020   PFIZER(Purple Top)SARS-COV-2 Vaccination 03/31/2019, 04/25/2019, 12/25/2019   PPD Test 11/02/2013, 11/07/2014, 02/16/2016, 03/21/2017, 04/05/2018, 04/25/2019, 05/01/2020   Pneumococcal Conjugate Pcv21, Polysaccharide Crm197 Conjugaf 09/01/2023   Pneumococcal Polysaccharide-23 02/16/2016   Td 09/01/2023   Tdap 02/06/2013, 09/01/2023   Zoster Recombinant(Shingrix) 11/13/2018, 02/01/2019    Screening Tests Health Maintenance  Topic Date Due   COVID-19 Vaccine (7 - 2025-26 season) 10/03/2023   Medicare Annual Wellness (AWV)  11/17/2024   DTaP/Tdap/Td (4 - Td or Tdap) 08/31/2033   Pneumococcal Vaccine: 50+ Years  Completed   Influenza Vaccine  Completed   Hepatitis C Screening  Completed   Zoster Vaccines- Shingrix  Completed   Meningococcal B Vaccine  Aged Out   Hepatitis B Vaccines 19-59 Average Risk  Discontinued   Colonoscopy  Discontinued    Health Maintenance Items Addressed: Vaccines Due: covid  Additional Screening:  Vision Screening: Recommended annual ophthalmology exams for early detection of glaucoma and other disorders of the eye. Is the patient up to date with their annual eye exam?  Yes  Who is the provider or what is the name of the office in which the patient attends annual eye exams?  [facility]  Dental Screening: Recommended annual dental exams for proper oral hygiene  Community Resource Referral / Chronic Care Management: CRR required this visit?  No   CCM required this visit?  No   Plan:    I have personally reviewed and noted the following in the patient's chart:   Medical and social history Use of alcohol, tobacco or illicit drugs  Current medications and supplements including opioid prescriptions. Patient is not currently taking opioid prescriptions. Functional ability and status Nutritional status Physical activity Advanced directives List of other physicians Hospitalizations, surgeries, and ER visits in previous 12 months Vitals Screenings to include cognitive, depression, and falls Referrals and appointments  In addition, I have reviewed and discussed with patient certain preventive protocols, quality metrics, and best practice recommendations. A written personalized care plan for preventive services as well as general preventive health recommendations were provided to patient.   Ardella FORBES Dawn, LPN   [id number]   After Visit Summary: (In Person-Printed) AVS printed and given to the patient  Notes: Nothing significant to report at this time.

## 2023-12-05 ENCOUNTER — Ambulatory Visit (AMBULATORY_SURGERY_CENTER): Admitting: Gastroenterology

## 2023-12-05 ENCOUNTER — Encounter: Payer: Self-pay | Admitting: Gastroenterology

## 2023-12-05 ENCOUNTER — Other Ambulatory Visit: Payer: Self-pay

## 2023-12-05 VITALS — BP 132/83 | HR 66 | Temp 97.7°F | Resp 12 | Ht 66.0 in | Wt 166.6 lb

## 2023-12-05 DIAGNOSIS — K5000 Crohn's disease of small intestine without complications: Secondary | ICD-10-CM

## 2023-12-05 DIAGNOSIS — K529 Noninfective gastroenteritis and colitis, unspecified: Secondary | ICD-10-CM | POA: Diagnosis not present

## 2023-12-05 DIAGNOSIS — K91850 Pouchitis: Secondary | ICD-10-CM | POA: Diagnosis not present

## 2023-12-05 DIAGNOSIS — K50813 Crohn's disease of both small and large intestine with fistula: Secondary | ICD-10-CM

## 2023-12-05 DIAGNOSIS — E785 Hyperlipidemia, unspecified: Secondary | ICD-10-CM | POA: Diagnosis not present

## 2023-12-05 DIAGNOSIS — K50919 Crohn's disease, unspecified, with unspecified complications: Secondary | ICD-10-CM

## 2023-12-05 DIAGNOSIS — Z9049 Acquired absence of other specified parts of digestive tract: Secondary | ICD-10-CM | POA: Diagnosis not present

## 2023-12-05 DIAGNOSIS — Z7960 Long term (current) use of unspecified immunomodulators and immunosuppressants: Secondary | ICD-10-CM

## 2023-12-05 DIAGNOSIS — I10 Essential (primary) hypertension: Secondary | ICD-10-CM | POA: Diagnosis not present

## 2023-12-05 DIAGNOSIS — F32A Depression, unspecified: Secondary | ICD-10-CM | POA: Diagnosis not present

## 2023-12-05 DIAGNOSIS — K633 Ulcer of intestine: Secondary | ICD-10-CM | POA: Diagnosis not present

## 2023-12-05 DIAGNOSIS — K56699 Other intestinal obstruction unspecified as to partial versus complete obstruction: Secondary | ICD-10-CM | POA: Diagnosis not present

## 2023-12-05 DIAGNOSIS — K91858 Other complications of intestinal pouch: Secondary | ICD-10-CM

## 2023-12-05 DIAGNOSIS — F419 Anxiety disorder, unspecified: Secondary | ICD-10-CM | POA: Diagnosis not present

## 2023-12-05 MED ORDER — SODIUM CHLORIDE 0.9 % IV SOLN: 500.0000 mL | INTRAVENOUS | Status: DC

## 2023-12-05 NOTE — Op Note (Signed)
  [facility] Patient Name: Dillon [name] Procedure Date: 12/05/2023 10:12 AM MRN: [id number] Endoscopist: Victory L. Legrand , MD, [id number] Age: 68 Referring MD:  Date of Birth: Sep 16, 1955 Gender: Male Account #: [id number] Procedure:                Pouchoscopy Indications:              History of total proctocolectomy, Inflammatory                            bowel disease                           Total proctocolectomy and J-pouch for UC, later                            clinical evolution to Crohn's picture with                            recurrent ulcerative pouchitis and perianal fistula                            requiring seton placement (since removed)                           Last endoscopic evaluation January 2025 prior to                            beginning Humira  monotherapy. So far excellent                            clinical response, therapeutic drug level and                            negative antibodies early into treatment course,                            recent fecal calprotectin = 7 Medicines:                Monitored Anesthesia Care Procedure:                Pre-Anesthesia Assessment:                           - Prior to the procedure, a History and Physical                            was performed, and patient medications and                            allergies were reviewed. The patient's tolerance of                            previous anesthesia was also reviewed. The risks                            and benefits of the procedure  and the sedation                            options and risks were discussed with the patient.                            All questions were answered, and informed consent                            was obtained. Prior Anticoagulants: The patient has                            taken no anticoagulant or antiplatelet agents. ASA                            Grade Assessment: II - A patient with mild systemic                             disease. After reviewing the risks and benefits,                            the patient was deemed in satisfactory condition to                            undergo the procedure.                           After obtaining informed consent, the endoscope was                            passed under direct vision. Throughout the                            procedure, the patient's blood pressure, pulse, and                            oxygen saturations were monitored continuously. The                            [id number]-HQ190L Colonoscope [id number] was introduced                            through the anus and advanced to the the ileoanal                            pouch and into the neo-terminal ileum. After                            obtaining informed consent, the endoscope was                            passed under direct vision. Throughout the  procedure, the patient's blood pressure, pulse, and                            oxygen saturations were monitored continuously.The                            procedure was performed without difficulty. The                            patient tolerated the procedure well. The quality                            of the bowel preparation was good. Scope In: 10:26:28 AM Scope Out: 10:33:30 AM Total Procedure Duration: 0 hours 7 minutes 2 seconds  Findings:                 Patient is status-post total colectomy with an                            ileal pouch-anal anastomosis.                           A post-surgical anastomosis was found on digital                            exam. This was found to be mildly stenosed, but was                            dilated with a finger. Pediatric colonoscope was                            easily advanced through the anal canal and stenosed                            (but not ulcerated) anastomosis into the pouch and                            then ultimately into the neoterminal ileum to  a                            distance of about 40 cm from the anal verge.                           The ileoanal pouch and neo-terminal ileum contained                            a benign-appearing, intrinsic moderate stenosis                            (approximately 10 cm from anal verge) that was                            traversed. This is considerable improvement in the  amount of stricturing compared to the last exam.                           The immediate prepouch neo-terminal ileum contained                            a single 3 mm ulcer. No bleeding was present. This                            is considerable improvement in the degree of IBD                            related inflammation and ulcers compared to the                            last exam. (To the extent of insertion)                           The rectal pouch mucosa appeared otherwise normal.                            The blind end of the J-pouch was also examined and                            the mucosa normal. Complications:            No immediate complications. Estimated Blood Loss:     Estimated blood loss: none. Impression:               - Stenosed but manually dilated post-surgical                            anastomosis found on digital exam.                           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                           - A single (solitary) ulcer in the neo-terminal                            ileum.                           - The rectal pouch is normal.                           - No specimens collected.                           - Ileal Crohn's disease. Recommendation:           - The day prior to the next scheduled Humira   injection (or day of, if done before the                            injection), following labs:                           Adalimumab  drug and antibody level, TPMT enzyme                             activity                           Clinic follow-up with me in 2 months. Muhamad Serano L. Legrand, MD 12/05/2023 10:50:39 AM This report has been signed electronically.

## 2023-12-05 NOTE — Patient Instructions (Signed)
 YOU HAD AN ENDOSCOPIC PROCEDURE TODAY AT THE [facility]:   Refer to the procedure report that was given to you for any specific questions about what was found during the examination.  If the procedure report does not answer your questions, please call your gastroenterologist to clarify.  If you requested that your care partner not be given the details of your procedure findings, then the procedure report has been included in a sealed envelope for you to review at your convenience later.  YOU SHOULD EXPECT: Some feelings of bloating in the abdomen. Passage of more gas than usual.  Walking can help get rid of the air that was put into your GI tract during the procedure and reduce the bloating. If you had a lower endoscopy (such as a colonoscopy or flexible sigmoidoscopy) you may notice spotting of blood in your stool or on the toilet paper. If you underwent a bowel prep for your procedure, you may not have a normal bowel movement for a few days.  Please Note:  You might notice some irritation and congestion in your nose or some drainage.  This is from the oxygen used during your procedure.  There is no need for concern and it should clear up in a day or so.  SYMPTOMS TO REPORT IMMEDIATELY:  Following lower endoscopy (colonoscopy or flexible sigmoidoscopy):  Excessive amounts of blood in the stool  Significant tenderness or worsening of abdominal pains  Swelling of the abdomen that is new, acute  Fever of 100F or higher  The day prior to the next scheduled Humira  injection (or day of, if done before the injection), following labs:  Adalimumab  drug and antibody level, TPMT enzyme activity Clinic follow up in 2 months with Dr. Legrand   For urgent or emergent issues, a gastroenterologist can be reached at any hour by calling [phone]. Do not use MyChart messaging for urgent concerns.    DIET:  We do recommend a small meal at first, but then you may proceed to your regular diet.   Drink plenty of fluids but you should avoid alcoholic beverages for 24 hours.  ACTIVITY:  You should plan to take it easy for the rest of today and you should NOT DRIVE or use heavy machinery until tomorrow (because of the sedation medicines used during the test).    FOLLOW UP: Our staff will call the number listed on your records the next business day following your procedure.  We will call around 7:15- 8:00 am to check on you and address any questions or concerns that you may have regarding the information given to you following your procedure. If we do not reach you, we will leave a message.     If any biopsies were taken you will be contacted by phone or by letter within the next 1-3 weeks.  Please call us  at [phone] if you have not heard about the biopsies in 3 weeks.    SIGNATURES/CONFIDENTIALITY: You and/or your care partner have signed paperwork which will be entered into your electronic medical record.  These signatures attest to the fact that that the information above on your After Visit Summary has been reviewed and is understood.  Full responsibility of the confidentiality of this discharge information lies with you and/or your care-partner.

## 2023-12-05 NOTE — Progress Notes (Signed)
 History and Physical:  This patient presents for endoscopic testing for: Encounter Diagnoses  Name Primary?   IBD (inflammatory bowel disease) Yes   Pouchitis (HCC)     68 year old man here today for lower endoscopic evaluation of chronic IBD related pouchitis.  Clinical details are in my last office note of 10/26/2023 with no significant clinical changes since then.  Patient is otherwise without complaints or active issues today.   Past Medical History: Past Medical History:  Diagnosis Date   Allergy 2000   Sometimes   Anal fissure    Anxiety Years   Cataract 3/25   Depression 2001   Occasionally   DJD (degenerative joint disease)    Elevated hemoglobin A1c    Hyperlipidemia    Hypertension    Pouchitis (HCC)    Ulcerative colitis (HCC)    s/p colectomy     Past Surgical History: Past Surgical History:  Procedure Laterality Date   COLON SURGERY  2000, reversal 2001   coloectomy, with reversal and now a J pouch   ELBOW LIGAMENT RECONSTRUCTION Right 08/01/2012   Dr Ahmad   ELBOW LIGAMENT RECONSTRUCTION Left 10/03/2014   Dr Ahmad   ELBOW SURGERY Right 02/02/2012   FLEXIBLE SIGMOIDOSCOPY N/A 12/02/2022   Procedure: FLEXIBLE SIGMOIDOSCOPY;  Surgeon: Teresa Lonni HERO, MD;  Location: [facility];  Service: General;  Laterality: N/A;   HAND SURGERY Right 02/01/1973   HAND SURGERY Right 02/01/1973   PLACEMENT OF SETON N/A 12/02/2022   Procedure: PLACEMENT OF SETON;  Surgeon: Teresa Lonni HERO, MD;  Location: [facility];  Service: General;  Laterality: N/A;   POUCHOSCOPY     RECTAL EXAM UNDER ANESTHESIA N/A 12/02/2022   Procedure: RECTAL EXAM UNDER ANESTHESIA;  Surgeon: Teresa Lonni HERO, MD;  Location: [facility];  Service: General;  Laterality: N/A;   SMALL INTESTINE SURGERY  2000    Allergies: Allergies  Allergen Reactions   Codeine Nausea And Vomiting   Norco [Hydrocodone -Acetaminophen ] Nausea And Vomiting     Per pt can take tylenol    Penicillins Nausea And Vomiting    Childhood rxn    Outpatient Meds: Current Outpatient Medications  Medication Sig Dispense Refill   ALPRAZolam  (XANAX ) 0.25 MG tablet TAKE 1/2 TO 1 TABLET BY MOUTH 2 TO 3 TIMES PER DAY AS NEEDED FOR ANXIETY 60 tablet 0   B Complex-C (B-COMPLEX WITH VITAMIN C) tablet Take 1 tablet by mouth daily.     Cholecalciferol (VITAMIN D  PO) Take 200 Units by mouth daily.      Na Sulfate-K Sulfate-Mg Sulfate concentrate (SUPREP BOWEL PREP KIT) 17.5-3.13-1.6 GM/177ML SOLN Take 1 kit (354 mLs total) by mouth as directed. For colonoscopy prep 354 mL 0   rosuvastatin  (CRESTOR ) 10 MG tablet Take 1 tablet (10 mg total) by mouth 3 (three) times a week. 39 tablet 3   acetaminophen  (TYLENOL ) 500 MG tablet Take 500 mg by mouth as needed.     adalimumab  (HUMIRA , 2 PEN,) 40 MG/0.4ML pen Inject 0.4 mLs (40 mg total) into the skin every 14 (fourteen) days. 2 each 11   diphenoxylate -atropine  (LOMOTIL ) 2.5-0.025 MG tablet TAKE 1-2 TABLET BY MOUTH FOR UP TO FOUR TIMES DAILY AS NEEDED FOR DIARRIHEA MAX 4 TIMES DAILY 30 tablet 0   ipratropium (ATROVENT ) 0.06 % nasal spray Use 1 to 2 sprays each nostril 2 to 3 x /day as needed (Patient taking differently: Place 1 spray into both nostrils as needed for rhinitis. Use 1 to 2 sprays each  nostril 2 to 3 x /day as needed) 45 mL 3   meloxicam  (MOBIC ) 7.5 MG tablet Take 1 tablet (7.5 mg total) by mouth as needed. 90 tablet 1   metroNIDAZOLE  (FLAGYL ) 500 MG tablet Take 1 tablet (500 mg total) by mouth 2 (two) times daily. 14 tablet 0   Current Facility-Administered Medications  Medication Dose Route Frequency Provider Last Rate Last Admin   0.9 %  sodium chloride  infusion  500 mL Intravenous Continuous Danis, Victory CROME III, MD          ___________________________________________________________________ Objective   Exam:  BP (!) 133/99   Pulse 87   Temp 97.7 F (36.5 C)   Ht 5' 6 (1.676 m)   Wt 166 lb 9.6 oz  (75.6 kg)   SpO2 98%   BMI 26.89 kg/m   CV: regular , S1/S2 Resp: clear to auscultation bilaterally, normal RR and effort noted GI: soft, no tenderness, with active bowel sounds.   Assessment: Encounter Diagnoses  Name Primary?   IBD (inflammatory bowel disease) Yes   Pouchitis (HCC)      Plan: Pouchoscopy  The benefits and risks of the planned procedure(s) were described in detail with the patient or (when appropriate) their health care proxy.  Risks were outlined as including, but not limited to, bleeding, infection, perforation, adverse medication reaction leading to cardiac or pulmonary decompensation, pancreatitis (if ERCP).  The limitation of incomplete mucosal visualization was also discussed.  No guarantees or warranties were given.  The patient is appropriate for an endoscopic procedure in the ambulatory setting.   - Victory Brand, MD

## 2023-12-05 NOTE — Progress Notes (Signed)
 Report given to PACU, vss

## 2023-12-05 NOTE — Progress Notes (Signed)
 Data will not transfer from monitor, vs being posted manually.

## 2023-12-06 ENCOUNTER — Telehealth: Payer: Self-pay | Admitting: *Deleted

## 2023-12-06 NOTE — Telephone Encounter (Signed)
 No answer post call. Left a message.

## 2023-12-09 ENCOUNTER — Ambulatory Visit: Admitting: Nurse Practitioner

## 2023-12-09 ENCOUNTER — Ambulatory Visit: Payer: Self-pay | Admitting: Nurse Practitioner

## 2023-12-09 ENCOUNTER — Encounter: Payer: Self-pay | Admitting: Nurse Practitioner

## 2023-12-09 VITALS — BP 116/64 | HR 62 | Temp 96.9°F | Ht 66.0 in | Wt 167.0 lb

## 2023-12-09 DIAGNOSIS — K5190 Ulcerative colitis, unspecified, without complications: Secondary | ICD-10-CM

## 2023-12-09 DIAGNOSIS — E782 Mixed hyperlipidemia: Secondary | ICD-10-CM | POA: Diagnosis not present

## 2023-12-09 DIAGNOSIS — M1990 Unspecified osteoarthritis, unspecified site: Secondary | ICD-10-CM

## 2023-12-09 DIAGNOSIS — R7303 Prediabetes: Secondary | ICD-10-CM

## 2023-12-09 DIAGNOSIS — R0989 Other specified symptoms and signs involving the circulatory and respiratory systems: Secondary | ICD-10-CM

## 2023-12-09 LAB — COMPREHENSIVE METABOLIC PANEL WITH GFR
ALT: 44 U/L (ref 0–53)
AST: 38 U/L — ABNORMAL HIGH (ref 0–37)
Albumin: 4.4 g/dL (ref 3.5–5.2)
Alkaline Phosphatase: 57 U/L (ref 39–117)
BUN: 16 mg/dL (ref 6–23)
CO2: 26 meq/L (ref 19–32)
Calcium: 9.3 mg/dL (ref 8.4–10.5)
Chloride: 102 meq/L (ref 96–112)
Creatinine, Ser: 0.87 mg/dL (ref 0.40–1.50)
GFR: 88.96 mL/min (ref >60.00)
Glucose, Bld: 96 mg/dL (ref 70–99)
Potassium: 3.5 meq/L (ref 3.5–5.1)
Sodium: 139 meq/L (ref 135–145)
Total Bilirubin: 0.6 mg/dL (ref 0.2–1.2)
Total Protein: 7.1 g/dL (ref 6.0–8.3)

## 2023-12-09 LAB — LIPID PANEL
Cholesterol: 182 mg/dL (ref 0–200)
HDL: 65.6 mg/dL (ref >39.00)
LDL Cholesterol: 85 mg/dL (ref 0–99)
NonHDL: 115.96
Total CHOL/HDL Ratio: 3
Triglycerides: 153 mg/dL — ABNORMAL HIGH (ref 0.0–149.0)
VLDL: 30.6 mg/dL (ref 0.0–40.0)

## 2023-12-09 LAB — HEMOGLOBIN A1C: Hgb A1c MFr Bld: 5.6 % (ref 4.6–6.5)

## 2023-12-09 NOTE — Patient Instructions (Signed)
 It was great to see you!  Start loratadine to cetirizine 1 tablet daily for 2 weeks to see if this helps with the throat clearing, if not, start prilosec over the counter for 2 weeks to see if this helps  We are checking your labs today and will let you know the results via mychart/phone.   Let's follow-up in 9 months, sooner if you have concerns.  If a referral was placed today, you will be contacted for an appointment. Please note that routine referrals can sometimes take up to 3-4 weeks to process. Please call our office if you haven't heard anything after this time frame.  Take care,  Tinnie Harada, NP

## 2023-12-09 NOTE — Assessment & Plan Note (Signed)
Chronic, stable.  Continue meloxicam 7.5 mg daily as needed.

## 2023-12-09 NOTE — Assessment & Plan Note (Signed)
 Improvement observed with Humira , confirmed by colonoscopy showing few large spots. Continue collaboration and recommendations from GI.

## 2023-12-09 NOTE — Progress Notes (Signed)
 Established Patient Office Visit  Subjective   Patient ID: Dillon [name], male    DOB: 1955/03/03  Age: 68 y.o. MRN: [id number]  Chief Complaint  Patient presents with   Hyperlipidemia    Follow up, no concerns    HPI Discussed the use of AI scribe software for clinical note transcription with the patient, who gave verbal consent to proceed.  History of Present Illness   Dillon [name] is a 68 year old male with Crohn's disease who presents for follow-up after a recent colonoscopy.  He recently underwent a colonoscopy, which showed improvement in Crohn's disease since starting Humira . He is scheduled for blood work before his next Humira  injection to monitor his levels.  He experiences increased throat clearing, which is worsening. There is no sensation of post-nasal drip, cough, or chest burning. He occasionally uses a nasal spray when attending events, as he notices he has rhinitis with eating, which he finds effective. He does not take any allergy medications currently.  He experiences pain in his knuckles and takes meloxicam , which he finds helpful. No chest pain or shortness of breath. He maintains an active lifestyle, engaging in activities such as painting and participating in a 5K event. He exercises regularly, aiming for three times a week.     ROS See pertinent positives and negatives per HPI.    Objective:     BP 116/64 (BP Location: Left Arm, Patient Position: Sitting, Cuff Size: Normal)   Pulse 62   Temp (!) 96.9 F (36.1 C)   Ht 5' 6 (1.676 m)   Wt 167 lb (75.8 kg)   SpO2 97%   BMI 26.95 kg/m     Physical Exam Vitals and nursing note reviewed.  Constitutional:      Appearance: Normal appearance.  HENT:     Head: Normocephalic.  Eyes:     Conjunctiva/sclera: Conjunctivae normal.  Cardiovascular:     Rate and Rhythm: Normal rate and regular rhythm.     Pulses: Normal pulses.     Heart sounds: Normal heart sounds.  Pulmonary:      Effort: Pulmonary effort is normal.     Breath sounds: Normal breath sounds.  Musculoskeletal:     Cervical back: Normal range of motion.  Skin:    General: Skin is warm.  Neurological:     General: No focal deficit present.     Mental Status: He is alert and oriented to person, place, and time.  Psychiatric:        Mood and Affect: Mood normal.        Behavior: Behavior normal.        Thought Content: Thought content normal.        Judgment: Judgment normal.     The 10-year ASCVD risk score (Arnett DK, et al., 2019) is: 11.1%    Assessment & Plan:   Problem List Items Addressed This Visit       Digestive   Ulcerative colitis (HCC)   Improvement observed with Humira , confirmed by colonoscopy showing few large spots. Continue collaboration and recommendations from GI.         Musculoskeletal and Integument   DJD (degenerative joint disease)   Chronic, stable. Continue meloxicam  7.5mg  daily as needed.         Other   Mixed hyperlipidemia - Primary   Chronic, ongoing. He recently increased rosuvastatin  to 10mg  3 times a week. Check CMP, lipid panel today and adjust regimen based on results.  Relevant Orders   Comprehensive metabolic panel with GFR (Completed)   Lipid panel (Completed)   Prediabetes   Chronic, stable. Check A1c today and treat based on results.       Relevant Orders   Hemoglobin A1c (Completed)   Throat clearing   Chronic throat clearing that has worsened recently. May be allergies vs GERD. Start loratadine 10mg  daily for 2 weeks and see if symptoms improve. If not, start omeprazole 40mg  daily for 2 weeks.       Return in about 9 months (around 09/07/2024) for CPE.    Tinnie DELENA Harada, NP

## 2023-12-09 NOTE — Assessment & Plan Note (Signed)
Chronic, stable. Check A1c today and treat based on results.

## 2023-12-09 NOTE — Assessment & Plan Note (Signed)
 Chronic throat clearing that has worsened recently. May be allergies vs GERD. Start loratadine 10mg  daily for 2 weeks and see if symptoms improve. If not, start omeprazole 40mg  daily for 2 weeks.

## 2023-12-09 NOTE — Assessment & Plan Note (Signed)
 Chronic, ongoing. He recently increased rosuvastatin  to 10mg  3 times a week. Check CMP, lipid panel today and adjust regimen based on results.

## 2023-12-20 ENCOUNTER — Other Ambulatory Visit

## 2023-12-20 DIAGNOSIS — K50919 Crohn's disease, unspecified, with unspecified complications: Secondary | ICD-10-CM | POA: Diagnosis not present

## 2023-12-20 DIAGNOSIS — Z7960 Long term (current) use of unspecified immunomodulators and immunosuppressants: Secondary | ICD-10-CM

## 2023-12-20 DIAGNOSIS — K50813 Crohn's disease of both small and large intestine with fistula: Secondary | ICD-10-CM

## 2023-12-27 LAB — ADALIMUMAB+AB (SERIAL MONITOR)
Adalimumab Drug Level: 16 ug/mL
Anti-Adalimumab Antibody: <25 ng/mL

## 2023-12-27 LAB — SERIAL MONITORING

## 2023-12-28 ENCOUNTER — Ambulatory Visit: Payer: Self-pay | Admitting: Gastroenterology

## 2023-12-29 LAB — THIOPURINE METHYLTRANSFERASE (TPMT), RBC: Thiopurine Methyltransferase, RBC: 16 nmol/h/mL

## 2024-01-23 ENCOUNTER — Ambulatory Visit: Admitting: Gastroenterology

## 2024-01-23 ENCOUNTER — Other Ambulatory Visit

## 2024-01-23 ENCOUNTER — Encounter: Payer: Self-pay | Admitting: Gastroenterology

## 2024-01-23 VITALS — BP 124/58 | HR 66 | Ht 67.0 in | Wt 173.1 lb

## 2024-01-23 DIAGNOSIS — K589 Irritable bowel syndrome without diarrhea: Secondary | ICD-10-CM

## 2024-01-23 DIAGNOSIS — R7401 Elevation of levels of liver transaminase levels: Secondary | ICD-10-CM

## 2024-01-23 DIAGNOSIS — Z7960 Long term (current) use of unspecified immunomodulators and immunosuppressants: Secondary | ICD-10-CM | POA: Diagnosis not present

## 2024-01-23 DIAGNOSIS — K529 Noninfective gastroenteritis and colitis, unspecified: Secondary | ICD-10-CM

## 2024-01-23 DIAGNOSIS — K91850 Pouchitis: Secondary | ICD-10-CM

## 2024-01-23 DIAGNOSIS — R194 Change in bowel habit: Secondary | ICD-10-CM

## 2024-01-23 LAB — COMPREHENSIVE METABOLIC PANEL WITH GFR
ALT: 50 U/L (ref 3–53)
AST: 25 U/L (ref 5–37)
Albumin: 4.7 g/dL (ref 3.5–5.2)
Alkaline Phosphatase: 48 U/L (ref 39–117)
BUN: 12 mg/dL (ref 6–23)
CO2: 29 meq/L (ref 19–32)
Calcium: 9.6 mg/dL (ref 8.4–10.5)
Chloride: 103 meq/L (ref 96–112)
Creatinine, Ser: 0.81 mg/dL (ref 0.40–1.50)
GFR: 90.82 mL/min
Glucose, Bld: 95 mg/dL (ref 70–99)
Potassium: 4 meq/L (ref 3.5–5.1)
Sodium: 139 meq/L (ref 135–145)
Total Bilirubin: 0.4 mg/dL (ref 0.2–1.2)
Total Protein: 7.2 g/dL (ref 6.0–8.3)

## 2024-01-23 LAB — CBC WITH DIFFERENTIAL/PLATELET
Basophils Absolute: 0.1 K/uL (ref 0.0–0.1)
Basophils Relative: 1.3 % (ref 0.0–3.0)
Eosinophils Absolute: 0.2 K/uL (ref 0.0–0.7)
Eosinophils Relative: 2.8 % (ref 0.0–5.0)
HCT: 43.6 % (ref 39.0–52.0)
Hemoglobin: 14.8 g/dL (ref 13.0–17.0)
Lymphocytes Relative: 40.6 % (ref 12.0–46.0)
Lymphs Abs: 3.1 K/uL (ref 0.7–4.0)
MCHC: 33.9 g/dL (ref 30.0–36.0)
MCV: 92.9 fl (ref 78.0–100.0)
Monocytes Absolute: 0.7 K/uL (ref 0.1–1.0)
Monocytes Relative: 9.2 % (ref 3.0–12.0)
Neutro Abs: 3.5 K/uL (ref 1.4–7.7)
Neutrophils Relative %: 46.1 % (ref 43.0–77.0)
Platelets: 301 K/uL (ref 150.0–400.0)
RBC: 4.69 Mil/uL (ref 4.22–5.81)
RDW: 13.2 % (ref 11.5–15.5)
WBC: 7.6 K/uL (ref 4.0–10.5)

## 2024-01-23 LAB — SEDIMENTATION RATE: Sed Rate: 6 mm/h (ref 0–20)

## 2024-01-23 LAB — FOLATE: Folate: 14.9 ng/mL

## 2024-01-23 LAB — VITAMIN B12: Vitamin B-12: 472 pg/mL (ref 211–911)

## 2024-01-23 LAB — VITAMIN D 25 HYDROXY (VIT D DEFICIENCY, FRACTURES): VITD: 25.06 ng/mL — ABNORMAL LOW (ref 30.00–100.00)

## 2024-01-23 NOTE — Patient Instructions (Signed)
 Your provider has requested that you go to the basement level for lab work before leaving today. Press B on the elevator. The lab is located at the first door on the left as you exit the elevator.  Due to recent changes in healthcare laws, you may see the results of your imaging and laboratory studies on MyChart before your provider has had a chance to review them.  We understand that in some cases there may be results that are confusing or concerning to you. Not all laboratory results come back in the same time frame and the provider may be waiting for multiple results in order to interpret others.  Please give us  48 hours in order for your provider to thoroughly review all the results before contacting the office for clarification of your results.    _______________________________________________________  If your blood pressure at your visit was 140/90 or greater, please contact your primary care physician to follow up on this.  _______________________________________________________  If you are age 68 or older, your body mass index should be between 23-30. Your Body mass index is 27.12 kg/m. If this is out of the aforementioned range listed, please consider follow up with your Primary Care Provider.  If you are age 32 or younger, your body mass index should be between 19-25. Your Body mass index is 27.12 kg/m. If this is out of the aformentioned range listed, please consider follow up with your Primary Care Provider.   ________________________________________________________  The [facility] providers would like to encourage you to use MYCHART to communicate with providers for non-urgent requests or questions.  Due to long hold times on the telephone, sending your provider a message by [facility] may be a faster and more efficient way to get a response.  Please allow 48 business hours for a response.  Please remember that this is for non-urgent requests.   _______________________________________________________  [facility] is using a team-based approach to care.  Your team is made up of your doctor and two to three APPS. Our APPS (Nurse Practitioners and Physician Assistants) work with your physician to ensure care continuity for you. They are fully qualified to address your health concerns and develop a treatment plan. They communicate directly with your gastroenterologist to care for you. Seeing the Advanced Practice Practitioners on your physician's team can help you by facilitating care more promptly, often allowing for earlier appointments, access to diagnostic testing, procedures, and other specialty referrals.   I appreciate the opportunity to care for you. Victory Brand, MD

## 2024-01-23 NOTE — Progress Notes (Signed)
 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 Teresa at [facility], who then referred him to our office. Pouchoscopy 02/22/2023 with endoscopic findings suggesting Crohn's disease. Dillon [name] saw Dr. Inocente Hausen at our office for IBD treatment opinion, and she concurred with institution of anti-TNF therapy (with possible need for Skyrizi or Jak inhibitor in the future, depending on response to anti-TNF therapy).  Of note, she also raise the possibility of SIBO should the patient have persistent diarrhea despite otherwise symptomatic and clinical improvement with Humira  Begin Humira  February at standard dosing 2025 -good trough levels early into treatment (May 2025).  Near total endoscopic remission proctoscopy/ileoscopy 12/05/2023 12/20/2023 labs: Normal TPMT (16), normal adalimumab  level of 16 with negative antibodies   History of Present Illness  Dillon [name] is generally doing well from the standpoint of his IBD.  About a month ago, roughly coinciding with one of his Humira  injections, he noticed change in bowel habits so they are somewhat more frequent and loose.  Denies abdominal pain or bleeding.  Says this is not unusual for him from time to time and he would take a couple doses of Flagyl  and that usually clears it up.  He had some improvement from doing that but not as much as usual.  No recent antibiotic use. He was also concerned because after an increase in his statin dose after not his PCP visit, recent LFTs were done and had AST of 38 with other LFTs normal (ULN is 37) Otherwise feels well and had no particular concerns today other than fatigue and some arthralgias in his hands.  He has been taking meloxicam  for that daily and about the last  week.  ROS: Cardiovascular:  no chest pain Respiratory: no dyspnea Arthralgias The patient's Past Medical, Family and Social History were reviewed and are on file in the EMR. Past Medical History:  Diagnosis Date   Allergy 2000   Sometimes   Anal fissure    Anxiety Years   Cataract 3/25   Depression 2001   Occasionally   DJD (degenerative joint disease)    Elevated hemoglobin A1c    Hyperlipidemia    Hypertension    Pouchitis (HCC)    Ulcerative colitis (HCC)    s/p colectomy    Past Surgical History:  Procedure Laterality Date   COLON SURGERY  2000, reversal 2001   coloectomy, with reversal and now a J pouch   ELBOW LIGAMENT RECONSTRUCTION Right 08/01/2012   Dr Ahmad   ELBOW LIGAMENT RECONSTRUCTION Left 10/03/2014   Dr Ahmad   ELBOW SURGERY Right 02/02/2012   FLEXIBLE SIGMOIDOSCOPY N/A 12/02/2022   Procedure: FLEXIBLE SIGMOIDOSCOPY;  Surgeon: Teresa Lonni HERO, MD;  Location: [facility];  Service: General;  Laterality: N/A;   HAND SURGERY Right 02/01/1973   HAND SURGERY Right 02/01/1973   PLACEMENT OF SETON N/A 12/02/2022   Procedure: PLACEMENT OF SETON;  Surgeon: Teresa Lonni HERO, MD;  Location: [facility];  Service: General;  Laterality: N/A;   POUCHOSCOPY     RECTAL EXAM UNDER ANESTHESIA N/A 12/02/2022   Procedure: RECTAL EXAM UNDER ANESTHESIA;  Surgeon: Teresa Lonni HERO, MD;  Location: [facility];  Service: General;  Laterality: N/A;   SMALL INTESTINE SURGERY  2000  Objective:  Med list reviewed Current Medications[1]   Vital signs in last 24 hrs: Vitals:   01/23/24 1515  BP: (!) 124/58  Pulse: 66   Wt Readings from Last 3 Encounters:  01/23/24 173 lb 2 oz (78.5 kg)  12/09/23 167 lb (75.8 kg)  12/05/23 166 lb 9.6 oz (75.6 kg)    Physical Exam  Well-appearing Cardiac: Regular without appreciable murmur,  no peripheral edema Pulm: clear to auscultation bilaterally, normal RR and  effort noted Abdomen: soft, no tenderness, with active bowel sounds. No guarding or palpable hepatosplenomegaly.    Labs:   ___________________________________________ Radiologic studies:   ____________________________________________ Other:   _____________________________________________   Encounter Diagnoses  Name Primary?   IBD (inflammatory bowel disease) Yes   Long-term use of immunosuppressant medication    Pouchitis (HCC)    Transaminitis    Change in bowel habits     Assessment & Plan  Much improved IBD pouchitis while on Humira  monotherapy.  We are continuing current dose send at the present time I am not inclined to add a thiopurine.  Change in bowel habits recently, similar to what he has previously had that we have thought maybe SIBO, but lasting longer this time. I offered him some Cipro , but he is inclined to monitor for now and send me a portal message if it does not normalize or certainly if it worsens.  Should that happen, I would give him a 7-day course of ciprofloxacin  500 mg twice daily and metronidazole  5 twice daily If symptoms persist despite doing that, C. difficile PCR, GDH antigen and A/B toxin  I reassured him that the minor elevation of the AST is not of concern at present gum even if it is related to the statin.  Will be less likely to do the adalimumab . He would be reassured if we checked it again today, so we will do so along with some other labs related to his IBD. Plan: CBC/diff, CMP,  ESR, B12, Vit D today  4 months OV.  Will set a clinical reminder, and I also asked him to set a reminder on his own calendar to check with us  if he has not heard about an appointment by 1 March.  I spent total of 30 minutes in both face-to-face (20 minutes interview/exam) and non-face-to-face (10 minutes chart review, care coordination, documentation)  activities, excluding procedures performed, for the visit on the date of this encounter.    Dillon LITTIE Brand  [name]     [1]  Current Outpatient Medications:    acetaminophen  (TYLENOL ) 500 MG tablet, Take 500 mg by mouth as needed., Disp: , Rfl:    adalimumab  (HUMIRA , 2 PEN,) 40 MG/0.4ML pen, Inject 0.4 mLs (40 mg total) into the skin every 14 (fourteen) days., Disp: 2 each, Rfl: 11   ALPRAZolam  (XANAX ) 0.25 MG tablet, TAKE 1/2 TO 1 TABLET BY MOUTH 2 TO 3 TIMES PER DAY AS NEEDED FOR ANXIETY, Disp: 60 tablet, Rfl: 0   B Complex-C (B-COMPLEX WITH VITAMIN C) tablet, Take 1 tablet by mouth daily., Disp: , Rfl:    Cholecalciferol (VITAMIN D  PO), Take 200 Units by mouth daily. , Disp: , Rfl:    diphenoxylate -atropine  (LOMOTIL ) 2.5-0.025 MG tablet, TAKE 1-2 TABLET BY MOUTH FOR UP TO FOUR TIMES DAILY AS NEEDED FOR DIARRIHEA MAX 4 TIMES DAILY, Disp: 30 tablet, Rfl: 0   ipratropium (ATROVENT ) 0.06 % nasal spray, Use 1 to 2 sprays each nostril 2 to 3 x /day as needed (Patient taking differently:  Place 1 spray into both nostrils as needed for rhinitis. Use 1 to 2 sprays each nostril 2 to 3 x /day as needed), Disp: 45 mL, Rfl: 3   meloxicam  (MOBIC ) 7.5 MG tablet, Take 1 tablet (7.5 mg total) by mouth as needed., Disp: 90 tablet, Rfl: 1   metroNIDAZOLE  (FLAGYL ) 500 MG tablet, Take 1 tablet (500 mg total) by mouth 2 (two) times daily., Disp: 14 tablet, Rfl: 0   rosuvastatin  (CRESTOR ) 10 MG tablet, Take 1 tablet (10 mg total) by mouth 3 (three) times a week., Disp: 39 tablet, Rfl: 3   Na Sulfate-K Sulfate-Mg Sulfate concentrate (SUPREP BOWEL PREP KIT) 17.5-3.13-1.6 GM/177ML SOLN, Take 1 kit (354 mLs total) by mouth as directed. For colonoscopy prep (Patient not taking: Reported on 01/23/2024), Disp: 354 mL, Rfl: 0  "

## 2024-01-24 ENCOUNTER — Ambulatory Visit: Payer: Self-pay | Admitting: Gastroenterology

## 2024-01-30 ENCOUNTER — Other Ambulatory Visit: Payer: Self-pay

## 2024-01-30 ENCOUNTER — Telehealth: Payer: Self-pay | Admitting: Gastroenterology

## 2024-01-30 MED ORDER — CIPROFLOXACIN HCL 500 MG PO TABS: 500.0000 mg | ORAL_TABLET | Freq: Two times a day (BID) | ORAL | 0 refills | Status: AC

## 2024-01-30 MED ORDER — VITAMIN D-3 125 MCG (5000 UT) PO TABS: ORAL_TABLET | ORAL | 2 refills | Status: AC

## 2024-01-30 NOTE — Telephone Encounter (Signed)
 Spoke with the patient. He would like to also get the Cipro  as discussed at the office visit. Persistent diarrhea up to 8-10 times daily despite Flagyl . Prescription for  5000 IU vitamin D  sent to the [facility] on [address]. Okay to give Cipro ?

## 2024-01-30 NOTE — Telephone Encounter (Signed)
 Inbound call from patient stating that his pharmacy not received the order for Vitamin D . Patient also stated that he was told if he is having issue using the bathroom we could prescribe him Sip pro. Patient is requesting a call back to be informed of when these medication have been sent over. Please advise.

## 2024-01-30 NOTE — Telephone Encounter (Signed)
"  Patient advised  "

## 2024-09-07 ENCOUNTER — Encounter: Admitting: Nurse Practitioner

## 2024-09-14 ENCOUNTER — Encounter: Admitting: Nurse Practitioner

## 2024-11-23 ENCOUNTER — Ambulatory Visit
